# Patient Record
Sex: Male | Born: 1935 | Race: White | Hispanic: No | Marital: Married | Smoking: Former smoker
Health system: Southern US, Community
[De-identification: ages and names within clinical notes are randomized; demographics above are authoritative.]

## PROBLEM LIST (undated history)

## (undated) DIAGNOSIS — Z8679 Personal history of other diseases of the circulatory system: Secondary | ICD-10-CM

## (undated) DIAGNOSIS — K219 Gastro-esophageal reflux disease without esophagitis: Secondary | ICD-10-CM

## (undated) DIAGNOSIS — E782 Mixed hyperlipidemia: Secondary | ICD-10-CM

## (undated) DIAGNOSIS — E785 Hyperlipidemia, unspecified: Secondary | ICD-10-CM

## (undated) DIAGNOSIS — M790 Rheumatism, unspecified: Secondary | ICD-10-CM

## (undated) DIAGNOSIS — C4491 Basal cell carcinoma of skin, unspecified: Secondary | ICD-10-CM

## (undated) DIAGNOSIS — I10 Essential (primary) hypertension: Secondary | ICD-10-CM

## (undated) DIAGNOSIS — G4700 Insomnia, unspecified: Secondary | ICD-10-CM

## (undated) DIAGNOSIS — I499 Cardiac arrhythmia, unspecified: Secondary | ICD-10-CM

## (undated) DIAGNOSIS — M1990 Unspecified osteoarthritis, unspecified site: Secondary | ICD-10-CM

## (undated) HISTORY — DX: Gastro-esophageal reflux disease without esophagitis: K21.9

## (undated) HISTORY — DX: Unspecified osteoarthritis, unspecified site: M19.90

## (undated) HISTORY — PX: HERNIA REPAIR: SHX51

## (undated) HISTORY — DX: Essential (primary) hypertension: I10

## (undated) HISTORY — PX: CATARACT EXTRACTION: SUR2

## (undated) HISTORY — DX: Basal cell carcinoma of skin, unspecified: C44.91

## (undated) HISTORY — DX: Mixed hyperlipidemia: E78.2

## (undated) HISTORY — DX: Personal history of other diseases of the circulatory system: Z86.79

## (undated) HISTORY — DX: Hyperlipidemia, unspecified: E78.5

## (undated) HISTORY — DX: Rheumatism, unspecified: M79.0

## (undated) HISTORY — DX: Cardiac arrhythmia, unspecified: I49.9

## (undated) HISTORY — PX: POLYPECTOMY: SHX149

## (undated) HISTORY — PX: HIP ARTHROPLASTY: SHX981

## (undated) HISTORY — DX: Insomnia, unspecified: G47.00

## (undated) HISTORY — PX: OTHER SURGICAL HISTORY: SHX169

## (undated) MED FILL — Ferumoxytol Inj 510 MG/17ML (30 MG/ML) (Elemental Fe): INTRAVENOUS | Qty: 17 | Status: AC

---

## 2002-06-30 ENCOUNTER — Encounter: Admission: RE | Admit: 2002-06-30 | Discharge: 2002-06-30 | Payer: Self-pay | Admitting: Family Medicine

## 2002-06-30 ENCOUNTER — Encounter: Payer: Self-pay | Admitting: Family Medicine

## 2002-08-17 ENCOUNTER — Ambulatory Visit (HOSPITAL_COMMUNITY): Admission: RE | Admit: 2002-08-17 | Discharge: 2002-08-17 | Payer: Self-pay | Admitting: Gastroenterology

## 2008-03-31 ENCOUNTER — Encounter: Admission: RE | Admit: 2008-03-31 | Discharge: 2008-03-31 | Payer: Self-pay | Admitting: Family Medicine

## 2008-09-19 ENCOUNTER — Encounter: Admission: RE | Admit: 2008-09-19 | Discharge: 2008-09-19 | Payer: Self-pay | Admitting: Family Medicine

## 2008-09-19 IMAGING — CR DG KNEE 1-2V*R*
2 series · 2 of 2 positions shown · non-contrast
Comparison: None.

CLINICAL DATA: Right hip and knee pain for several months.  No
known injury.

RIGHT KNEE - 1-2 VIEW

[view not recorded (1 of 2)]
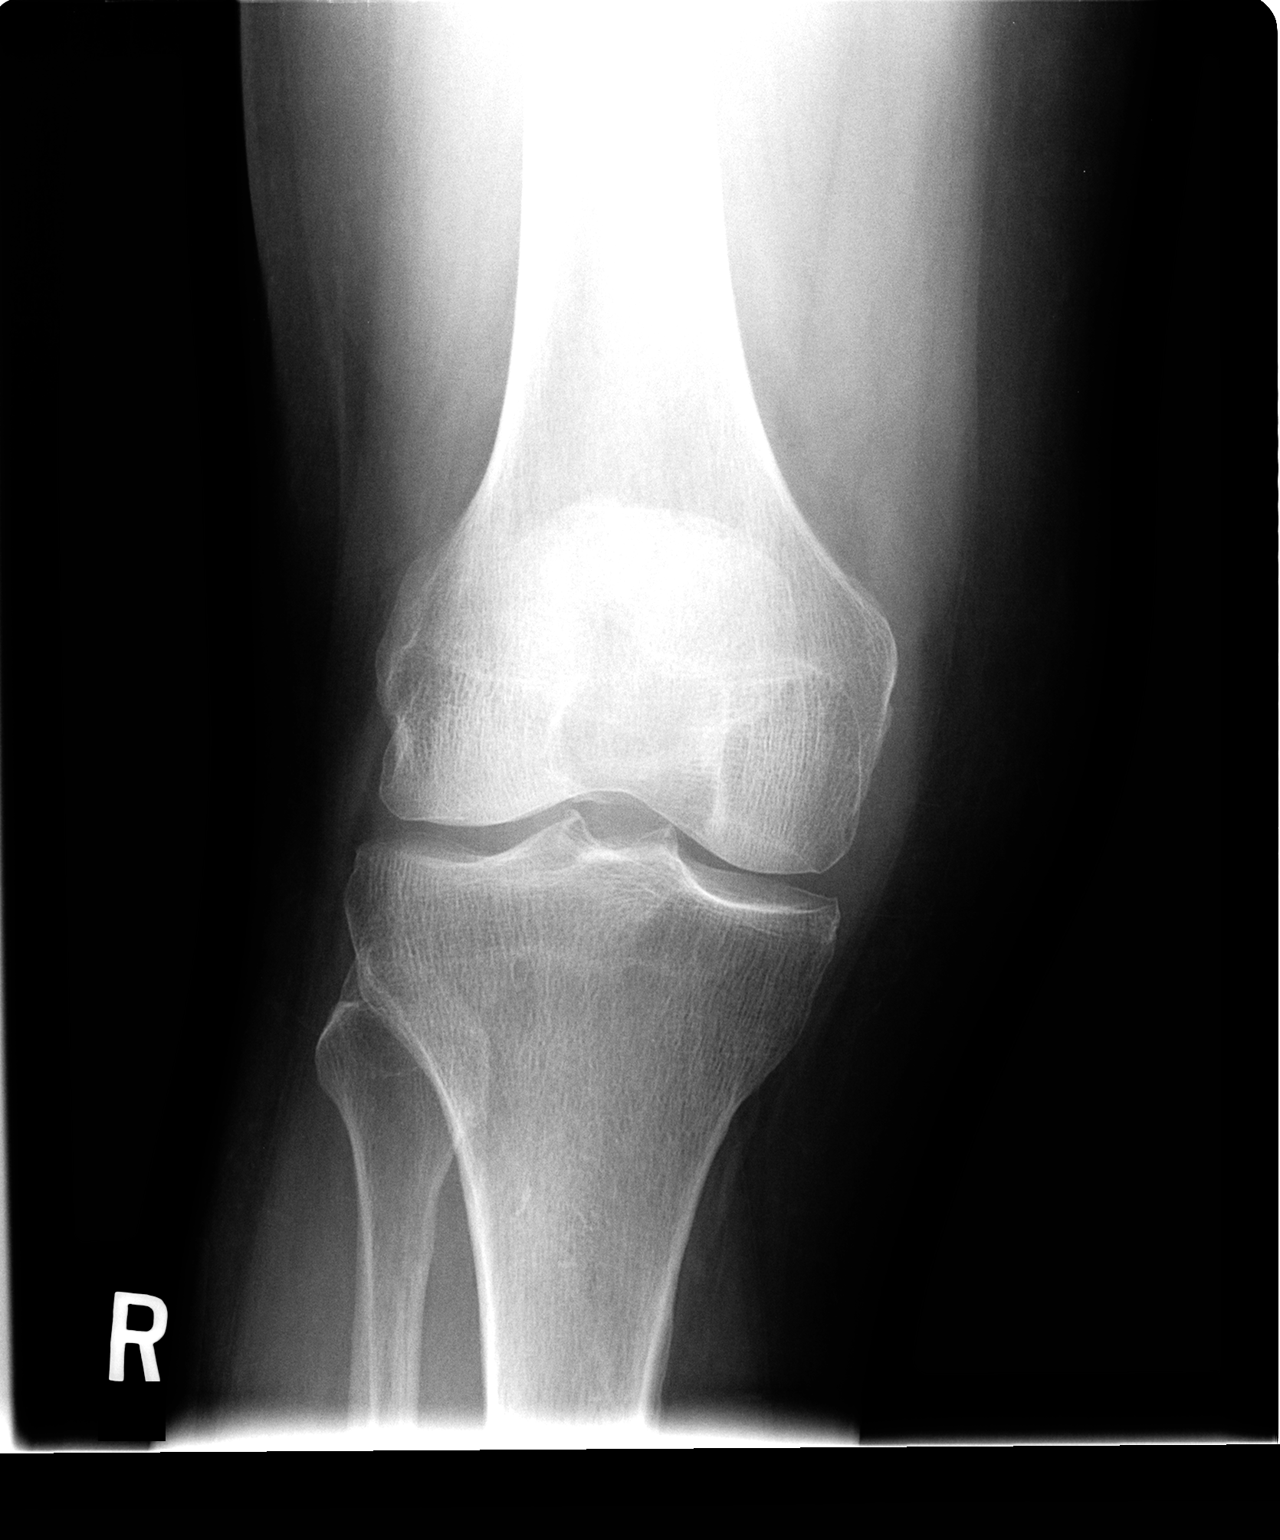

[view not recorded (2 of 2)]
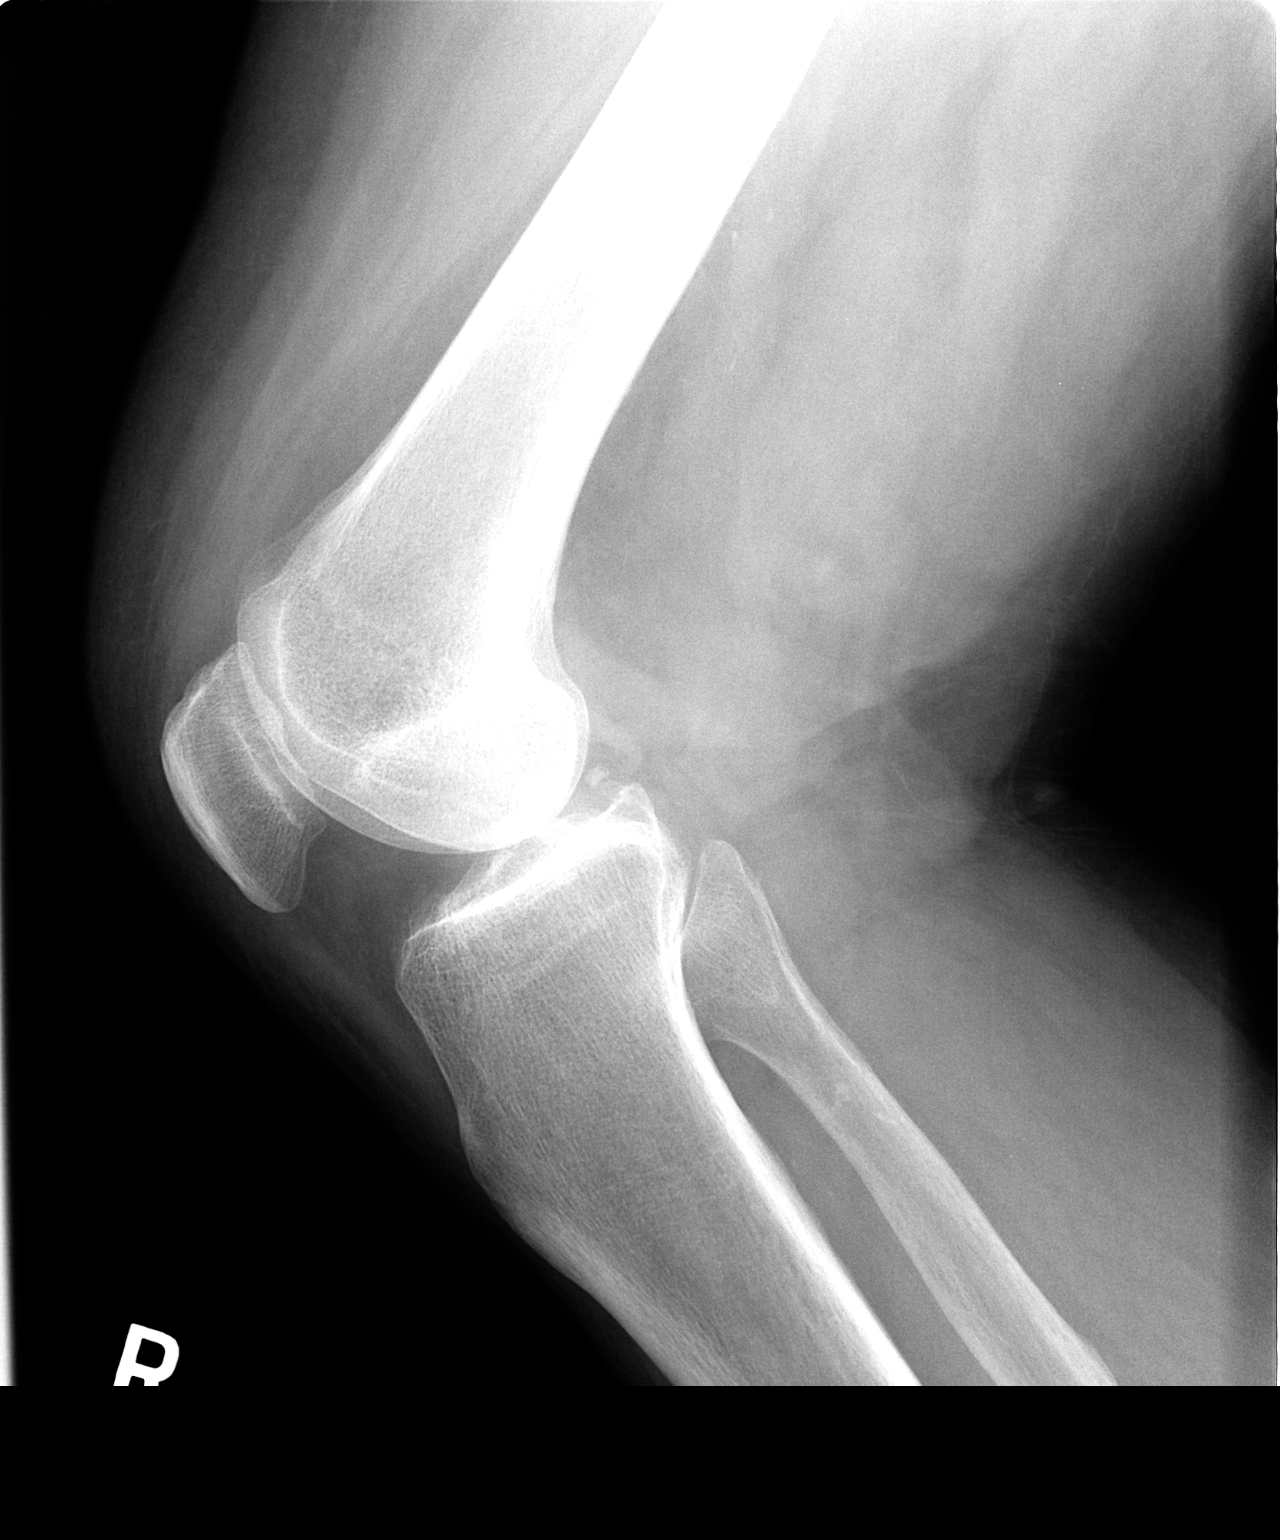

[2 of 2 positions shown; findings below may reference images not displayed]

FINDINGS: Minimal tibial spine and medial spur formation.  No
effusion seen.
IMPRESSION: Minimal degenerative changes.

## 2009-09-28 ENCOUNTER — Encounter: Admission: RE | Admit: 2009-09-28 | Discharge: 2009-09-28 | Payer: Self-pay | Admitting: Family Medicine

## 2010-08-09 NOTE — Op Note (Signed)
   NAME:  Mike Mclaughlin, Mike Mclaughlin                           ACCOUNT NO.:  [id number]   MEDICAL RECORD NO.:  [id number]                   PATIENT TYPE:  AMB   LOCATION:  ENDO                                 FACILITY:  [facility]   PHYSICIAN:  Anselmo Rod, M.D.               DATE OF BIRTH:  [birth year]   DATE OF PROCEDURE:  08/18/2002  DATE OF DISCHARGE:                                 OPERATIVE REPORT   PROCEDURE PERFORMED:  Esophagogastroduodenoscopy.   ENDOSCOPIST:  Charna Elizabeth, M.D.   INSTRUMENT USED:  Olympus video panendoscope.   INDICATIONS FOR PROCEDURE:  The patient is a 76 year old [place] male with a  history of dysphagia undergoing EGD for possible dilatation.  The patient's  symptoms have improved considerably after starting a PPI.  He had a barium  swallow done that showed mild scarring possibly stricture from chronic  reflux.   PREPROCEDURE PREPARATION:  Informed consent was procured from the patient.  The patient was fasted for eight hours prior to the procedure.   PREPROCEDURE PHYSICAL:  The patient had stable vital signs.  Neck supple,  chest clear to auscultation.  S1, S2 regular.  Abdomen soft with normal  bowel sounds.   DESCRIPTION OF PROCEDURE:  The patient was placed in the left lateral  decubitus position and sedated with 60 mg of Demerol and 6 mg of Versed  intravenously.  Once the patient was adequately sedated and maintained on  low-flow oxygen and continuous cardiac monitoring, the Olympus video  panendoscope was advanced through the mouth piece over the tongue into the  esophagus under direct vision.  The entire esophagus appeared normal and was  widely patent with no evidence of ring, stricture, esophagitis or Barrett's  mucosa.  The scope was then advanced to the stomach.  There was mild diffuse  gastritis throughout the gastric mucosa.  No erosions, ulcerations, masses  or polyps were seen.  The duodenal bulb and the proximal small bowel distal  to the bulb  appeared normal.   IMPRESSION:  1. Widely patent esophagus.  2. Mild diffuse gastritis, otherwise normal esophagogastroduodenoscopy.    RECOMMENDATIONS:  1. Continue Prevacid.  2. Proceed with a colonoscopy at this time.  Further recommendations will be     made after the colonoscopy.                                                Anselmo Rod, M.D.    JNM/MEDQ  D:  08/18/2002  T:  08/19/2002  Job:  [id number]   cc:   Talmadge Coventry, M.D.  [address]  [place]  [place] [address]  Fax: [phone]

## 2010-08-09 NOTE — Op Note (Signed)
   NAME:  Mike Mclaughlin, Mike Mclaughlin                           ACCOUNT NO.:  [id number]   MEDICAL RECORD NO.:  [id number]                   PATIENT TYPE:  AMB   LOCATION:  ENDO                                 FACILITY:  [facility]   PHYSICIAN:  Anselmo Rod, M.D.               DATE OF BIRTH:  [birth year]   DATE OF PROCEDURE:  08/17/2002  DATE OF DISCHARGE:                                 OPERATIVE REPORT   PROCEDURE:  Colonoscopy.   ENDOSCOPIST:  Anselmo Rod, M.D.   INSTRUMENT USED:  Olympus video colonoscope.   INDICATION FOR PROCEDURE:  Personal history of polyps in a 76 year old [place]  male.  Rule out recurrent polyps.   PREPROCEDURE PREPARATION:  Informed consent was procured from the patient.  The patient was fasted for eight hours prior to the procedure and prepped  with a bottle of magnesium citrate and a gallon of GoLYTELY the night prior  to the procedure.   PREPROCEDURE PHYSICAL:  VITAL SIGNS:  The patient had stable vital signs.  NECK:  Supple.  CHEST:  Clear to auscultation.  S1, S2 regular.  ABDOMEN:  Soft with normal bowel sounds.   DESCRIPTION OF PROCEDURE:  The patient was placed in the left lateral  decubitus position and sedated with 40 mg of Demerol and an additional 4 mg  of Versed intravenously.  Once the patient was adequately sedate and  maintained on low-flow oxygen and continuous cardiac monitoring, the Olympus  video colonoscope was advanced from the rectum to the cecum and terminal  ileum without difficulty.  No masses, polyps, erosions, ulcerations, or  diverticula were seen.  A small internal hemorrhoid was seen on retroflexion  in the rectum.  The patient tolerated the procedure well without  complication.   IMPRESSION:  Normal colonoscopy up to the terminal ileum except for small  internal hemorrhoids.    RECOMMENDATIONS:  1. A high-fiber diet with liberal fluid intake has been recommended.  2. Repeat CRC is recommended in the next five years unless the  patient     develops any abnormal symptoms in the interim.                                               Anselmo Rod, M.D.    JNM/MEDQ  D:  08/18/2002  T:  08/19/2002  Job:  [id number]   cc:   Talmadge Coventry, M.D.  [address]  [place]  [place] [address]  Fax: [phone]

## 2011-05-14 DIAGNOSIS — J069 Acute upper respiratory infection, unspecified: Secondary | ICD-10-CM | POA: Diagnosis not present

## 2011-07-14 DIAGNOSIS — Z23 Encounter for immunization: Secondary | ICD-10-CM | POA: Diagnosis not present

## 2011-07-14 DIAGNOSIS — I10 Essential (primary) hypertension: Secondary | ICD-10-CM | POA: Diagnosis not present

## 2011-07-14 DIAGNOSIS — F17200 Nicotine dependence, unspecified, uncomplicated: Secondary | ICD-10-CM | POA: Diagnosis not present

## 2011-07-14 DIAGNOSIS — J449 Chronic obstructive pulmonary disease, unspecified: Secondary | ICD-10-CM | POA: Diagnosis not present

## 2011-07-14 DIAGNOSIS — M542 Cervicalgia: Secondary | ICD-10-CM | POA: Diagnosis not present

## 2011-07-14 DIAGNOSIS — E7800 Pure hypercholesterolemia, unspecified: Secondary | ICD-10-CM | POA: Diagnosis not present

## 2011-08-06 DIAGNOSIS — J069 Acute upper respiratory infection, unspecified: Secondary | ICD-10-CM | POA: Diagnosis not present

## 2011-08-06 DIAGNOSIS — J449 Chronic obstructive pulmonary disease, unspecified: Secondary | ICD-10-CM | POA: Diagnosis not present

## 2011-09-05 ENCOUNTER — Encounter: Payer: Self-pay | Admitting: *Deleted

## 2011-09-23 DIAGNOSIS — N401 Enlarged prostate with lower urinary tract symptoms: Secondary | ICD-10-CM | POA: Diagnosis not present

## 2011-09-23 DIAGNOSIS — N138 Other obstructive and reflux uropathy: Secondary | ICD-10-CM | POA: Diagnosis not present

## 2011-09-23 DIAGNOSIS — R972 Elevated prostate specific antigen [PSA]: Secondary | ICD-10-CM | POA: Diagnosis not present

## 2011-12-31 ENCOUNTER — Encounter: Payer: Self-pay | Admitting: Cardiology

## 2012-02-26 DIAGNOSIS — Z23 Encounter for immunization: Secondary | ICD-10-CM | POA: Diagnosis not present

## 2012-03-05 DIAGNOSIS — J209 Acute bronchitis, unspecified: Secondary | ICD-10-CM | POA: Diagnosis not present

## 2012-05-26 ENCOUNTER — Encounter: Payer: Self-pay | Admitting: *Deleted

## 2012-06-10 DIAGNOSIS — G4700 Insomnia, unspecified: Secondary | ICD-10-CM | POA: Diagnosis not present

## 2012-06-10 DIAGNOSIS — E7800 Pure hypercholesterolemia, unspecified: Secondary | ICD-10-CM | POA: Diagnosis not present

## 2012-06-10 DIAGNOSIS — M1990 Unspecified osteoarthritis, unspecified site: Secondary | ICD-10-CM | POA: Diagnosis not present

## 2012-06-10 DIAGNOSIS — Z0000 Encounter for general adult medical examination without abnormal findings: Secondary | ICD-10-CM | POA: Diagnosis not present

## 2012-08-24 DIAGNOSIS — G4700 Insomnia, unspecified: Secondary | ICD-10-CM | POA: Diagnosis not present

## 2012-08-24 DIAGNOSIS — F17200 Nicotine dependence, unspecified, uncomplicated: Secondary | ICD-10-CM | POA: Diagnosis not present

## 2012-08-24 DIAGNOSIS — M1990 Unspecified osteoarthritis, unspecified site: Secondary | ICD-10-CM | POA: Diagnosis not present

## 2012-08-24 DIAGNOSIS — I499 Cardiac arrhythmia, unspecified: Secondary | ICD-10-CM | POA: Diagnosis not present

## 2012-09-05 DIAGNOSIS — I10 Essential (primary) hypertension: Secondary | ICD-10-CM | POA: Diagnosis not present

## 2012-09-23 DIAGNOSIS — R972 Elevated prostate specific antigen [PSA]: Secondary | ICD-10-CM | POA: Diagnosis not present

## 2012-12-20 DIAGNOSIS — Z23 Encounter for immunization: Secondary | ICD-10-CM | POA: Diagnosis not present

## 2013-10-11 DIAGNOSIS — Z79899 Other long term (current) drug therapy: Secondary | ICD-10-CM | POA: Diagnosis not present

## 2013-10-11 DIAGNOSIS — E782 Mixed hyperlipidemia: Secondary | ICD-10-CM | POA: Diagnosis not present

## 2013-10-17 DIAGNOSIS — Z23 Encounter for immunization: Secondary | ICD-10-CM | POA: Diagnosis not present

## 2013-10-17 DIAGNOSIS — J449 Chronic obstructive pulmonary disease, unspecified: Secondary | ICD-10-CM | POA: Diagnosis not present

## 2013-10-17 DIAGNOSIS — R5381 Other malaise: Secondary | ICD-10-CM | POA: Diagnosis not present

## 2013-10-17 DIAGNOSIS — R222 Localized swelling, mass and lump, trunk: Secondary | ICD-10-CM | POA: Diagnosis not present

## 2013-10-17 DIAGNOSIS — R5383 Other fatigue: Secondary | ICD-10-CM | POA: Diagnosis not present

## 2013-10-28 DIAGNOSIS — J988 Other specified respiratory disorders: Secondary | ICD-10-CM | POA: Diagnosis not present

## 2013-10-28 DIAGNOSIS — E278 Other specified disorders of adrenal gland: Secondary | ICD-10-CM | POA: Diagnosis not present

## 2013-10-28 DIAGNOSIS — R599 Enlarged lymph nodes, unspecified: Secondary | ICD-10-CM | POA: Diagnosis not present

## 2013-10-28 DIAGNOSIS — K449 Diaphragmatic hernia without obstruction or gangrene: Secondary | ICD-10-CM | POA: Diagnosis not present

## 2013-10-28 DIAGNOSIS — J438 Other emphysema: Secondary | ICD-10-CM | POA: Diagnosis not present

## 2013-10-28 DIAGNOSIS — R911 Solitary pulmonary nodule: Secondary | ICD-10-CM | POA: Diagnosis not present

## 2013-10-28 DIAGNOSIS — J984 Other disorders of lung: Secondary | ICD-10-CM | POA: Diagnosis not present

## 2013-10-28 DIAGNOSIS — J398 Other specified diseases of upper respiratory tract: Secondary | ICD-10-CM | POA: Diagnosis not present

## 2013-11-25 ENCOUNTER — Encounter: Payer: Self-pay | Admitting: Critical Care Medicine

## 2013-11-29 ENCOUNTER — Encounter: Payer: Self-pay | Admitting: Critical Care Medicine

## 2013-11-29 ENCOUNTER — Ambulatory Visit (INDEPENDENT_AMBULATORY_CARE_PROVIDER_SITE_OTHER): Payer: Self-pay | Admitting: Critical Care Medicine

## 2013-11-29 VITALS — BP 136/74 | HR 61 | Temp 98.2°F | Ht 70.0 in | Wt 216.0 lb

## 2013-11-29 DIAGNOSIS — R0989 Other specified symptoms and signs involving the circulatory and respiratory systems: Secondary | ICD-10-CM | POA: Diagnosis not present

## 2013-11-29 DIAGNOSIS — E785 Hyperlipidemia, unspecified: Secondary | ICD-10-CM | POA: Insufficient documentation

## 2013-11-29 DIAGNOSIS — J438 Other emphysema: Secondary | ICD-10-CM

## 2013-11-29 DIAGNOSIS — M1990 Unspecified osteoarthritis, unspecified site: Secondary | ICD-10-CM

## 2013-11-29 DIAGNOSIS — K219 Gastro-esophageal reflux disease without esophagitis: Secondary | ICD-10-CM | POA: Insufficient documentation

## 2013-11-29 DIAGNOSIS — J441 Chronic obstructive pulmonary disease with (acute) exacerbation: Secondary | ICD-10-CM | POA: Insufficient documentation

## 2013-11-29 DIAGNOSIS — R911 Solitary pulmonary nodule: Secondary | ICD-10-CM

## 2013-11-29 DIAGNOSIS — I10 Essential (primary) hypertension: Secondary | ICD-10-CM

## 2013-11-29 DIAGNOSIS — J449 Chronic obstructive pulmonary disease, unspecified: Secondary | ICD-10-CM | POA: Insufficient documentation

## 2013-11-29 DIAGNOSIS — J432 Centrilobular emphysema: Secondary | ICD-10-CM

## 2013-11-29 DIAGNOSIS — Z8679 Personal history of other diseases of the circulatory system: Secondary | ICD-10-CM | POA: Insufficient documentation

## 2013-11-29 DIAGNOSIS — R0609 Other forms of dyspnea: Secondary | ICD-10-CM | POA: Diagnosis not present

## 2013-11-29 HISTORY — DX: Solitary pulmonary nodule: R91.1

## 2013-11-29 HISTORY — DX: Chronic obstructive pulmonary disease, unspecified: J44.9

## 2013-11-29 MED ORDER — TIOTROPIUM BROMIDE MONOHYDRATE 18 MCG IN CAPS: 18.0000 ug | ORAL_CAPSULE | Freq: Every day | RESPIRATORY_TRACT | Status: DC

## 2013-11-29 MED ORDER — BUDESONIDE-FORMOTEROL FUMARATE 80-4.5 MCG/ACT IN AERO: 2.0000 | INHALATION_SPRAY | Freq: Two times a day (BID) | RESPIRATORY_TRACT | Status: DC

## 2013-11-29 MED ORDER — ALBUTEROL SULFATE HFA 108 (90 BASE) MCG/ACT IN AERS: 2.0000 | INHALATION_SPRAY | Freq: Four times a day (QID) | RESPIRATORY_TRACT | Status: DC | PRN

## 2013-11-29 NOTE — Assessment & Plan Note (Signed)
L lingular pulm nodule Needs f/u in one year 10/2014

## 2013-11-29 NOTE — Patient Instructions (Signed)
Resume spiriva Stay on symbicort A rescue inhaler proair will be given, use as needed for shortness of breath An overnight sleep oxygen test will be obtained Return 4 months Repeat CT Chest in 12 months, we will call and schedule

## 2013-11-29 NOTE — Assessment & Plan Note (Signed)
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  Ex smoker No desaturation with exertion Plan Refill and stay on spiriva Stay on symbicort two puff twice daily Check ONO on RA Not a candidate for pulm rehab due to joint issues F/u CT chest one year for pulm nodule

## 2013-11-29 NOTE — Progress Notes (Signed)
Subjective:    Patient ID: Mike Mclaughlin, male    DOB: [birth year], 79 y.o.   MRN: [id number]  HPI Comments: Copd referral from Dr Sheffield Slider.  Dx two years Copd.  Dyspnea , occ cough.    Shortness of Breath This is a chronic problem. The current episode started more than 1 year ago. The problem occurs daily (exertion only, only a few feet.  Not at rest). The problem has been gradually worsening. Associated symptoms include leg pain, leg swelling, sputum production and wheezing. Pertinent negatives include no abdominal pain, chest pain, claudication, coryza, ear pain, fever, headaches, hemoptysis, neck pain, orthopnea, PND, rash, rhinorrhea, sore throat, swollen glands, syncope or vomiting. The symptoms are aggravated by any activity and emotional upset. Risk factors include smoking. Treatments tried: spiriva, symbicort. The treatment provided moderate relief. His past medical history is significant for COPD. There is no history of allergies, aspirin allergies, asthma, bronchiolitis, CAD, chronic lung disease, DVT, a heart failure, PE, pneumonia or a recent surgery.    Past Medical History  Diagnosis Date  . Dyslipidemia   . H/O sinus bradycardia   . Hypertension   . Mixed hyperlipidemia   . Insomnia, unspecified   . Cardiac dysrhythmia, unspecified   . Arthritis   . Esophageal reflux   . Skin cancer, basal cell   . Rheumatism      Family History  Problem Relation Age of Onset  . Other Father     brain tumor  . Cancer Mother   . Heart disease Sister   . Stroke Father   . Rheumatologic disease Mother      History   Social History  . Marital Status: Married    Spouse Name: N/A    Number of Children: N/A  . Years of Education: N/A   Occupational History  . Not on file.   Social History Main Topics  . Smoking status: Former Smoker -- 0.50 packs/day for 15 years    Types: Cigarettes    Quit date: 08/29/2013  . Smokeless tobacco: Never Used  . Alcohol Use: No  . Drug  Use: No  . Sexual Activity: Not on file   Other Topics Concern  . Not on file   Social History Narrative   Married lives with wife.   Retired     No Known Allergies   Outpatient Prescriptions Prior to Visit  Medication Sig Dispense Refill  . aspirin 81 MG tablet Take 81 mg by mouth daily.      Marland Kitchen omeprazole (PRILOSEC) 20 MG capsule Take 20 mg by mouth daily.      . Tamsulosin HCl (FLOMAX) 0.4 MG CAPS Take by mouth.      . budesonide-formoterol (SYMBICORT) 80-4.5 MCG/ACT inhaler Inhale 2 puffs into the lungs 2 (two) times daily.      Marland Kitchen atorvastatin (LIPITOR) 40 MG tablet Take 40 mg by mouth daily.      . MELOXICAM PO Take by mouth.       No facility-administered medications prior to visit.      Review of Systems  Constitutional: Positive for fatigue. Negative for fever.  HENT: Positive for trouble swallowing. Negative for ear pain, postnasal drip, rhinorrhea, sinus pressure, sneezing, sore throat and voice change.   Respiratory: Positive for cough, sputum production, shortness of breath and wheezing. Negative for apnea, hemoptysis, choking, chest tightness and stridor.   Cardiovascular: Positive for leg swelling. Negative for chest pain, orthopnea, claudication, syncope and PND.  Gastrointestinal: Negative for vomiting  and abdominal pain.       Occ GERD  Musculoskeletal: Positive for arthralgias. Negative for neck pain.  Skin: Negative for rash.  Neurological: Negative for headaches.       Objective:   Physical Exam Filed Vitals:   11/29/13 0936  BP: 136/74  Pulse: 61  Temp: 98.2 F (36.8 C)  TempSrc: Oral  Height: 5\' 10"  (1.778 m)  Weight: 216 lb (97.977 kg)  SpO2: 92%    Gen: Pleasant, well-nourished, in no distress,  normal affect  ENT: No lesions,  mouth clear,  oropharynx clear, no postnasal drip  Neck: No JVD, no TMG, no carotid bruits  Lungs: No use of accessory muscles, no dullness to percussion, distant BS  Cardiovascular: RRR, heart sounds normal,  no murmur or gallops, no peripheral edema  Abdomen: soft and NT, no HSM,  BS normal  Musculoskeletal: No deformities, no cyanosis or clubbing  Neuro: alert, non focal  Skin: Warm, no lesions or rashes  No results found.   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  Ex smoker No desaturation with exertion Plan Refill and stay on spiriva Stay on symbicort two puff twice daily Check ONO on RA Not a candidate for pulm rehab due to joint issues F/u CT chest one year for pulm nodule   Solitary pulmonary nodule L lingular pulm nodule Needs f/u in one year 10/2014   Updated Medication List Outpatient Encounter Prescriptions as of 11/29/2013  Medication Sig  . aspirin 81 MG tablet Take 81 mg by mouth daily.  . budesonide-formoterol (SYMBICORT) 80-4.5 MCG/ACT inhaler Inhale 2 puffs into the lungs 2 (two) times daily.  Marland Kitchen omeprazole (PRILOSEC) 20 MG capsule Take 20 mg by mouth daily.  . Tamsulosin HCl (FLOMAX) 0.4 MG CAPS Take by mouth.  . [DISCONTINUED] budesonide-formoterol (SYMBICORT) 80-4.5 MCG/ACT inhaler Inhale 2 puffs into the lungs 2 (two) times daily.  Marland Kitchen albuterol (PROAIR HFA) 108 (90 BASE) MCG/ACT inhaler Inhale 2 puffs into the lungs every 6 (six) hours as needed for wheezing or shortness of breath.  . tiotropium (SPIRIVA HANDIHALER) 18 MCG inhalation capsule Place 1 capsule (18 mcg total) into inhaler and inhale daily.  . [DISCONTINUED] atorvastatin (LIPITOR) 40 MG tablet Take 40 mg by mouth daily.  . [DISCONTINUED] MELOXICAM PO Take by mouth.  . [DISCONTINUED] tiotropium (SPIRIVA HANDIHALER) 18 MCG inhalation capsule Place 18 mcg into inhaler and inhale daily.

## 2013-12-02 ENCOUNTER — Encounter: Payer: Self-pay | Admitting: Critical Care Medicine

## 2013-12-02 DIAGNOSIS — J449 Chronic obstructive pulmonary disease, unspecified: Secondary | ICD-10-CM | POA: Diagnosis not present

## 2013-12-02 DIAGNOSIS — J438 Other emphysema: Secondary | ICD-10-CM | POA: Diagnosis not present

## 2013-12-09 ENCOUNTER — Telehealth: Payer: Self-pay | Admitting: Critical Care Medicine

## 2013-12-09 DIAGNOSIS — J439 Emphysema, unspecified: Secondary | ICD-10-CM

## 2013-12-09 NOTE — Telephone Encounter (Signed)
Pt had ONO on RA on Sept 11, 2015.  Results received and reviewed by PW.  Per PW: pt needs o2 2 lpm qhs.  Called, spoke with pt.   Explained results and recs to him.  He verbalized understanding and is aware DME will be contacting him to set up o2.  He is to call office if he has any further questions/concerns or if not contacted by DME within the next few days.    Results placed in scan folder.

## 2014-01-06 DIAGNOSIS — Z23 Encounter for immunization: Secondary | ICD-10-CM | POA: Diagnosis not present

## 2014-01-16 DIAGNOSIS — J069 Acute upper respiratory infection, unspecified: Secondary | ICD-10-CM | POA: Diagnosis not present

## 2014-01-27 DIAGNOSIS — K219 Gastro-esophageal reflux disease without esophagitis: Secondary | ICD-10-CM | POA: Diagnosis not present

## 2014-01-27 DIAGNOSIS — J00 Acute nasopharyngitis [common cold]: Secondary | ICD-10-CM | POA: Diagnosis not present

## 2014-02-20 DIAGNOSIS — E785 Hyperlipidemia, unspecified: Secondary | ICD-10-CM | POA: Diagnosis not present

## 2014-02-20 DIAGNOSIS — J449 Chronic obstructive pulmonary disease, unspecified: Secondary | ICD-10-CM | POA: Diagnosis not present

## 2014-02-20 DIAGNOSIS — R001 Bradycardia, unspecified: Secondary | ICD-10-CM | POA: Diagnosis not present

## 2014-02-20 DIAGNOSIS — I2510 Atherosclerotic heart disease of native coronary artery without angina pectoris: Secondary | ICD-10-CM | POA: Diagnosis not present

## 2014-03-02 DIAGNOSIS — R001 Bradycardia, unspecified: Secondary | ICD-10-CM | POA: Diagnosis not present

## 2014-05-02 ENCOUNTER — Telehealth: Payer: Self-pay | Admitting: Critical Care Medicine

## 2014-05-02 NOTE — Telephone Encounter (Signed)
eror

## 2014-05-09 ENCOUNTER — Ambulatory Visit (INDEPENDENT_AMBULATORY_CARE_PROVIDER_SITE_OTHER): Payer: Self-pay | Admitting: Critical Care Medicine

## 2014-05-09 ENCOUNTER — Encounter: Payer: Self-pay | Admitting: Critical Care Medicine

## 2014-05-09 VITALS — BP 136/64 | HR 50 | Temp 96.6°F | Ht 70.0 in | Wt 215.0 lb

## 2014-05-09 DIAGNOSIS — J449 Chronic obstructive pulmonary disease, unspecified: Secondary | ICD-10-CM | POA: Diagnosis not present

## 2014-05-09 DIAGNOSIS — R911 Solitary pulmonary nodule: Secondary | ICD-10-CM | POA: Diagnosis not present

## 2014-05-09 DIAGNOSIS — J432 Centrilobular emphysema: Secondary | ICD-10-CM | POA: Diagnosis not present

## 2014-05-09 DIAGNOSIS — J4489 Other specified chronic obstructive pulmonary disease: Secondary | ICD-10-CM

## 2014-05-09 NOTE — Patient Instructions (Signed)
No change in medications. Return in          6 months A repeat CT of the Chest for the lung nodule will be obtained August 2016

## 2014-05-09 NOTE — Assessment & Plan Note (Signed)
Chronic obstructive lung disease with emphysematous component and chronic bronchitic component gold stage C stable this time Plan Maintain oxygen at night 2 L Maintain inhaled medications as prescribed Return 6 months

## 2014-05-09 NOTE — Assessment & Plan Note (Signed)
Solitary left lingular nodule 5 mm in diameter from August 2015 Plan Repeat nodule scan August 2016

## 2014-05-09 NOTE — Progress Notes (Signed)
Subjective:    Patient ID: Mike Mclaughlin, male    DOB: 03/10/1935, 80 y.o.   MRN: [id number]  HPI Comments: Copd referral from Dr Sheffield Slider.  Dx two years Copd.  Dyspnea , occ cough.    Shortness of Breath Associated symptoms include leg swelling and wheezing. Pertinent negatives include no abdominal pain, chest pain, ear pain, fever, headaches, neck pain, rash, rhinorrhea, sore throat, swollen glands or vomiting.   05/09/2014 Chief Complaint  Patient presents with  . Follow-up    DOE unchanged.  Not much coughing.  No chest tightness/CP or wheezing.  Plan Refill and stay on spiriva Stay on symbicort two puff twice daily Check ONO on RA  Dyspnea the same.  On the symbicort two puff bid.  Pt on oxygen 2L qhs     Review of Systems  Constitutional: Positive for fatigue. Negative for fever.  HENT: Positive for trouble swallowing. Negative for ear pain, postnasal drip, rhinorrhea, sinus pressure, sneezing, sore throat and voice change.   Respiratory: Positive for cough, shortness of breath and wheezing. Negative for apnea, choking, chest tightness and stridor.   Cardiovascular: Positive for leg swelling. Negative for chest pain.  Gastrointestinal: Negative for vomiting and abdominal pain.       Occ GERD  Musculoskeletal: Positive for arthralgias. Negative for neck pain.  Skin: Negative for rash.  Neurological: Negative for headaches.       Objective:   Physical Exam Filed Vitals:   05/09/14 1213  BP: 136/64  Pulse: 50  Temp: 96.6 F (35.9 C)  TempSrc: Oral  Height: 5\' 10"  (1.778 m)  Weight: 215 lb (97.523 kg)  SpO2: 93%    Gen: Pleasant, well-nourished, in no distress,  normal affect  ENT: No lesions,  mouth clear,  oropharynx clear, no postnasal drip  Neck: No JVD, no TMG, no carotid bruits  Lungs: No use of accessory muscles, no dullness to percussion, distant BS  Cardiovascular: RRR, heart sounds normal, no murmur or gallops, no peripheral edema  Abdomen:  soft and NT, no HSM,  BS normal  Musculoskeletal: No deformities, no cyanosis or clubbing  Neuro: alert, non focal  Skin: Warm, no lesions or rashes  No results found.         Assessment & Plan:   COPD with emphysema Chronic obstructive lung disease with emphysematous component and chronic bronchitic component gold stage C stable this time Plan Maintain oxygen at night 2 L Maintain inhaled medications as prescribed Return 6 months   Solitary pulmonary nodule Solitary left lingular nodule 5 mm in diameter from August 2015 Plan Repeat nodule scan August 2016     Updated Medication List Outpatient Encounter Prescriptions as of 05/09/2014  Medication Sig  . aspirin 81 MG tablet Take 81 mg by mouth daily.  . budesonide-formoterol (SYMBICORT) 80-4.5 MCG/ACT inhaler Inhale 2 puffs into the lungs 2 (two) times daily.  Marland Kitchen omeprazole (PRILOSEC) 20 MG capsule Take 20 mg by mouth daily.  . ranitidine (ZANTAC) 300 MG tablet Take 300 mg by mouth at bedtime as needed.  . Tamsulosin HCl (FLOMAX) 0.4 MG CAPS Take 0.4 mg by mouth daily.   . [DISCONTINUED] albuterol (PROAIR HFA) 108 (90 BASE) MCG/ACT inhaler Inhale 2 puffs into the lungs every 6 (six) hours as needed for wheezing or shortness of breath. (Patient not taking: Reported on 05/09/2014)  . [DISCONTINUED] tiotropium (SPIRIVA HANDIHALER) 18 MCG inhalation capsule Place 1 capsule (18 mcg total) into inhaler and inhale daily. (Patient not taking: Reported on  05/09/2014)      

## 2014-06-02 ENCOUNTER — Other Ambulatory Visit: Payer: Self-pay | Admitting: Acute Care

## 2014-08-15 DIAGNOSIS — R972 Elevated prostate specific antigen [PSA]: Secondary | ICD-10-CM | POA: Diagnosis not present

## 2014-08-15 DIAGNOSIS — N138 Other obstructive and reflux uropathy: Secondary | ICD-10-CM | POA: Diagnosis not present

## 2014-08-15 DIAGNOSIS — N401 Enlarged prostate with lower urinary tract symptoms: Secondary | ICD-10-CM | POA: Diagnosis not present

## 2014-08-28 DIAGNOSIS — H26492 Other secondary cataract, left eye: Secondary | ICD-10-CM | POA: Diagnosis not present

## 2014-10-25 ENCOUNTER — Telehealth: Payer: Self-pay | Admitting: Critical Care Medicine

## 2014-10-25 DIAGNOSIS — R911 Solitary pulmonary nodule: Secondary | ICD-10-CM

## 2014-10-25 NOTE — Telephone Encounter (Signed)
Pt needing CT Chest (WO contrast) in 10/2014 per last OV with PW on 2.16.16. LMTCB for pt to inform him it is time to schedule f/u CT. Will need to ask pt if he is wanting to have CT in [place], if so - will need to document within CT order.

## 2014-10-26 NOTE — Telephone Encounter (Signed)
Called and spoke with the pt and notified due for ct chest f/u spn  He verbalized understanding  Wants to get this done in [place], prefers pm appt  Order sent to [facility]

## 2014-11-07 DIAGNOSIS — J439 Emphysema, unspecified: Secondary | ICD-10-CM | POA: Diagnosis not present

## 2014-11-07 DIAGNOSIS — R911 Solitary pulmonary nodule: Secondary | ICD-10-CM | POA: Diagnosis not present

## 2014-11-07 DIAGNOSIS — Z85828 Personal history of other malignant neoplasm of skin: Secondary | ICD-10-CM | POA: Diagnosis not present

## 2014-11-10 ENCOUNTER — Telehealth: Payer: Self-pay | Admitting: Critical Care Medicine

## 2014-11-10 DIAGNOSIS — R911 Solitary pulmonary nodule: Secondary | ICD-10-CM

## 2014-11-10 NOTE — Telephone Encounter (Signed)
Let pt know CT chest stable. Nodule unchanged Needs another scan in ONE year

## 2014-11-14 NOTE — Telephone Encounter (Signed)
Discussed CT follow up with Dr. Joya Gaskins.  1 yr CT reminder in PW's reminder box to f/u on CT Chest.

## 2014-11-14 NOTE — Telephone Encounter (Signed)
Called, spoke with pt.  Discussed below CT Chest results and recs per Dr. Joya Gaskins.  Pt verbalized understanding. Will discuss repeat scan with PW.

## 2014-12-03 ENCOUNTER — Other Ambulatory Visit: Payer: Self-pay | Admitting: Critical Care Medicine

## 2014-12-05 ENCOUNTER — Ambulatory Visit (INDEPENDENT_AMBULATORY_CARE_PROVIDER_SITE_OTHER): Payer: Self-pay | Admitting: Critical Care Medicine

## 2014-12-05 ENCOUNTER — Encounter: Payer: Self-pay | Admitting: Critical Care Medicine

## 2014-12-05 VITALS — BP 130/62 | HR 57 | Temp 97.6°F | Ht 70.0 in | Wt 208.8 lb

## 2014-12-05 DIAGNOSIS — J449 Chronic obstructive pulmonary disease, unspecified: Secondary | ICD-10-CM

## 2014-12-05 DIAGNOSIS — J432 Centrilobular emphysema: Secondary | ICD-10-CM

## 2014-12-05 DIAGNOSIS — R911 Solitary pulmonary nodule: Secondary | ICD-10-CM | POA: Diagnosis not present

## 2014-12-05 DIAGNOSIS — G4734 Idiopathic sleep related nonobstructive alveolar hypoventilation: Secondary | ICD-10-CM

## 2014-12-05 DIAGNOSIS — J4489 Other specified chronic obstructive pulmonary disease: Secondary | ICD-10-CM

## 2014-12-05 MED ORDER — ALBUTEROL SULFATE 108 (90 BASE) MCG/ACT IN AEPB: 2.0000 | INHALATION_SPRAY | Freq: Four times a day (QID) | RESPIRATORY_TRACT | Status: DC | PRN

## 2014-12-05 MED ORDER — BUDESONIDE-FORMOTEROL FUMARATE 80-4.5 MCG/ACT IN AERO: 2.0000 | INHALATION_SPRAY | Freq: Two times a day (BID) | RESPIRATORY_TRACT | Status: DC

## 2014-12-05 NOTE — Patient Instructions (Signed)
Use symbicort two puff twice daily Obtain repeat CT Chest in one year Continue oxygen at night 2Liters Return 6 months

## 2014-12-05 NOTE — Progress Notes (Signed)
Subjective:    Patient ID: Mike Mclaughlin, male    DOB: 06-Sep-1934, 80 y.o.   MRN: [id number]  HPI 12/05/2014 Chief Complaint  Patient presents with  . Follow-up    Had CT Chest last mth. at [place].c/o sob with exertion,does well inside but outside with humidity,cough-[place],thick x 3-4 wks.,occass. wheezing,c/o chest congestion, denies cp or tightness,no fcs,no pnd.Needs Symbicort refilled today.    No real change . Pt on oxygen 2L qhs and prn daytime  This patient continues to use and benefit from her oxygen therapy and has re qualified at this visit for continued oxygen therapy  Pt denies any significant sore throat, nasal congestion or excess secretions, fever, chills, sweats, unintended weight loss, pleurtic or exertional chest pain, orthopnea PND, or leg swelling Pt denies any increase in rescue therapy over baseline, denies waking up needing it or having any early am or nocturnal exacerbations of coughing/wheezing/or dyspnea. Pt also denies any obvious fluctuation in symptoms with  weather or environmental change or other alleviating or aggravating factors   Current Medications, Allergies, Complete Past Medical History, Past Surgical History, Family History, and Social History were reviewed in [facility] record per todays encounter:  12/05/2014     Review of Systems  Constitutional: Negative.   HENT: Negative.  Negative for ear pain, postnasal drip, rhinorrhea, sinus pressure, sore throat, trouble swallowing and voice change.   Eyes: Negative.   Respiratory: Negative.  Negative for apnea, cough, choking, chest tightness, shortness of breath, wheezing and stridor.   Cardiovascular: Negative.  Negative for chest pain, palpitations and leg swelling.  Gastrointestinal: Negative.  Negative for nausea, vomiting, abdominal pain and abdominal distention.  Genitourinary: Negative.   Musculoskeletal: Negative.  Negative for myalgias and arthralgias.  Skin:  Negative.  Negative for rash.  Allergic/Immunologic: Negative.  Negative for environmental allergies and food allergies.  Neurological: Negative.  Negative for dizziness, syncope, weakness and headaches.  Hematological: Negative.  Negative for adenopathy. Does not bruise/bleed easily.  Psychiatric/Behavioral: Negative.  Negative for sleep disturbance and agitation. The patient is not nervous/anxious.        Objective:   Physical Exam Filed Vitals:   12/05/14 1101  BP: 130/62  Pulse: 57  Temp: 97.6 F (36.4 C)  TempSrc: Oral  Height: 5\' 10"  (1.778 m)  Weight: 208 lb 12.8 oz (94.711 kg)  SpO2: 95%    Gen: Pleasant, well-nourished, in no distress,  normal affect  ENT: No lesions,  mouth clear,  oropharynx clear, no postnasal drip  Neck: No JVD, no TMG, no carotid bruits  Lungs: No use of accessory muscles, no dullness to percussion, clear without rales or rhonchi  Cardiovascular: RRR, heart sounds normal, no murmur or gallops, no peripheral edema  Abdomen: soft and NT, no HSM,  BS normal  Musculoskeletal: No deformities, no cyanosis or clubbing  Neuro: alert, non focal  Skin: Warm, no lesions or rashes  No results found.  Ct chest 10/2014 62mm lingular nodule unchanged from before       Assessment & Plan:  I personally reviewed all images and lab data in the [facility] system as well as any outside material available during this office visit and agree with the  radiology impressions.   COPD with emphysema Gold C Stable copd gold C Plan Use symbicort two puff twice daily Obtain repeat CT Chest in one year Continue oxygen at night 2Liters Return 6 months    Solitary pulmonary nodule CT chest 10/2014 no change in 5MM L  lingula nodule Plan Repeat Ct chest 2017 if no change then is done with ct f/u    Mike Mclaughlin was seen today for follow-up.  Diagnoses and all orders for this visit:  COPD with chronic bronchitis  Nocturnal hypoxemia  Pulmonary  nodule  Centrilobular emphysema  Solitary pulmonary nodule  Other orders -     budesonide-formoterol (SYMBICORT) 80-4.5 MCG/ACT inhaler; Inhale 2 puffs into the lungs 2 (two) times daily. -     Albuterol Sulfate (PROAIR RESPICLICK) 017 (90 BASE) MCG/ACT AEPB; Inhale 2 puffs into the lungs every 6 (six) hours as needed.

## 2014-12-07 ENCOUNTER — Other Ambulatory Visit: Payer: Self-pay | Admitting: Critical Care Medicine

## 2014-12-07 DIAGNOSIS — R911 Solitary pulmonary nodule: Secondary | ICD-10-CM

## 2014-12-07 NOTE — Assessment & Plan Note (Signed)
Stable copd gold C Plan Use symbicort two puff twice daily Obtain repeat CT Chest in one year Continue oxygen at night 2Liters Return 6 months

## 2014-12-07 NOTE — Assessment & Plan Note (Signed)
CT chest 10/2014 no change in 5MM L lingula nodule Plan Repeat Ct chest 2017 if no change then is done with ct f/u

## 2015-01-22 ENCOUNTER — Encounter: Payer: Self-pay | Admitting: Critical Care Medicine

## 2015-01-29 DIAGNOSIS — I10 Essential (primary) hypertension: Secondary | ICD-10-CM | POA: Diagnosis not present

## 2015-01-29 DIAGNOSIS — K219 Gastro-esophageal reflux disease without esophagitis: Secondary | ICD-10-CM | POA: Diagnosis not present

## 2015-02-08 DIAGNOSIS — Z23 Encounter for immunization: Secondary | ICD-10-CM | POA: Diagnosis not present

## 2015-04-23 DIAGNOSIS — K5900 Constipation, unspecified: Secondary | ICD-10-CM | POA: Diagnosis not present

## 2015-04-23 DIAGNOSIS — J208 Acute bronchitis due to other specified organisms: Secondary | ICD-10-CM | POA: Diagnosis not present

## 2015-04-23 DIAGNOSIS — B9689 Other specified bacterial agents as the cause of diseases classified elsewhere: Secondary | ICD-10-CM | POA: Diagnosis not present

## 2015-05-01 DIAGNOSIS — Z0000 Encounter for general adult medical examination without abnormal findings: Secondary | ICD-10-CM | POA: Diagnosis not present

## 2015-05-01 DIAGNOSIS — K5900 Constipation, unspecified: Secondary | ICD-10-CM | POA: Diagnosis not present

## 2015-05-01 DIAGNOSIS — Z1389 Encounter for screening for other disorder: Secondary | ICD-10-CM | POA: Diagnosis not present

## 2015-05-01 DIAGNOSIS — Z9181 History of falling: Secondary | ICD-10-CM | POA: Diagnosis not present

## 2015-05-08 DIAGNOSIS — K5900 Constipation, unspecified: Secondary | ICD-10-CM | POA: Diagnosis not present

## 2015-09-19 DIAGNOSIS — H35371 Puckering of macula, right eye: Secondary | ICD-10-CM | POA: Diagnosis not present

## 2015-10-16 ENCOUNTER — Telehealth: Payer: Self-pay | Admitting: Internal Medicine

## 2015-10-16 DIAGNOSIS — R911 Solitary pulmonary nodule: Secondary | ICD-10-CM

## 2015-10-16 MED ORDER — BUDESONIDE-FORMOTEROL FUMARATE 80-4.5 MCG/ACT IN AERO: 2.0000 | INHALATION_SPRAY | Freq: Two times a day (BID) | RESPIRATORY_TRACT | 11 refills | Status: DC

## 2015-10-16 NOTE — Telephone Encounter (Signed)
Pt is aware of appt made on 11-14-15 at 3pm in [place] office with MW and CT Chest without contrast for Dx Solitary pulmonary nodule ordered under MW.   MW okay with refill for Symbicort under his name to [facility] [address] Tia Alert. Nothing more needed at this time.

## 2015-10-23 DIAGNOSIS — R911 Solitary pulmonary nodule: Secondary | ICD-10-CM | POA: Diagnosis not present

## 2015-10-29 ENCOUNTER — Telehealth: Payer: Self-pay | Admitting: Internal Medicine

## 2015-10-29 NOTE — Telephone Encounter (Signed)
Per MW- CT Chest 09/14/15 at [facility] was negative, no further studies are needed  Spoke with pt and notified of results per Dr. Melvyn Novas. Pt verbalized understanding and denied any questions.

## 2015-11-05 DIAGNOSIS — M160 Bilateral primary osteoarthritis of hip: Secondary | ICD-10-CM | POA: Diagnosis not present

## 2015-11-05 DIAGNOSIS — I10 Essential (primary) hypertension: Secondary | ICD-10-CM | POA: Diagnosis not present

## 2015-11-05 DIAGNOSIS — J449 Chronic obstructive pulmonary disease, unspecified: Secondary | ICD-10-CM | POA: Diagnosis not present

## 2015-11-05 DIAGNOSIS — Z720 Tobacco use: Secondary | ICD-10-CM | POA: Diagnosis not present

## 2015-11-07 ENCOUNTER — Encounter: Payer: Self-pay | Admitting: Internal Medicine

## 2015-11-14 ENCOUNTER — Ambulatory Visit (INDEPENDENT_AMBULATORY_CARE_PROVIDER_SITE_OTHER): Payer: Medicare Other | Admitting: Internal Medicine

## 2015-11-14 ENCOUNTER — Encounter: Payer: Self-pay | Admitting: Internal Medicine

## 2015-11-14 VITALS — BP 126/80 | HR 58 | Ht 70.0 in | Wt 206.0 lb

## 2015-11-14 DIAGNOSIS — J449 Chronic obstructive pulmonary disease, unspecified: Secondary | ICD-10-CM | POA: Diagnosis not present

## 2015-11-14 DIAGNOSIS — J9611 Chronic respiratory failure with hypoxia: Secondary | ICD-10-CM | POA: Insufficient documentation

## 2015-11-14 DIAGNOSIS — R911 Solitary pulmonary nodule: Secondary | ICD-10-CM | POA: Diagnosis not present

## 2015-11-14 HISTORY — DX: Chronic respiratory failure with hypoxia: J96.11

## 2015-11-14 MED ORDER — BUDESONIDE-FORMOTEROL FUMARATE 160-4.5 MCG/ACT IN AERO: 2.0000 | INHALATION_SPRAY | Freq: Two times a day (BID) | RESPIRATORY_TRACT | 0 refills | Status: DC

## 2015-11-14 NOTE — Progress Notes (Signed)
Subjective:    Patient ID: Mike Mclaughlin, male    DOB: 05-Sep-1934      MRN: [id number]    Brief patient profile:  36 yowm quit smoking 08/2013 with copd/ 02 at hs  Overall worse quit smoking and GOLD III criteria as of 11/14/2015    History of Present Illness  12/05/2014  Dr Drucie Opitz final note  Chief Complaint  Patient presents with  . Follow-up    Had CT Chest last mth. at [place].c/o sob with exertion,does well inside but outside with humidity,cough-[place],thick x 3-4 wks.,occass. wheezing,c/o chest congestion, denies cp or tightness,no fcs,no pnd.Needs Symbicort refilled today.  No real change . Pt on oxygen 2L qhs and prn daytime  This patient continues to use and benefit from her oxygen therapy and has re qualified at this visit for continued oxygen therapy rec Use symbicort two puff twice daily Obtain repeat CT Chest in one year> no change Continue oxygen at night 2Liters   11/14/2015  Extended transition of care  ov/Mike Mclaughlin re: GOLD III copd/ symb 80  Chief Complaint  Patient presents with  . Follow-up    former PW pt. breathing has worsen, pt c/o increased sob, prod cough w/[place] thick mucus & wheezing. on 2.5-3L qhs & prn  doe = MMRC3 = can't walk 100 yards even at a slow pace at a flat grade s stopping due to sob  Can't complete one aisle at [facility] Cough daily year round worse in am but doesn't wake him noc/ rattling all day long / some better on mucinex dm  Takes prilosec hs  No obvious day to day or daytime variability or assoc purulent sputum or mucus plugs or hemoptysis or cp or chest tightness, subjective wheeze or overt sinus or hb symptoms. No unusual exp hx or h/o childhood pna/ asthma or knowledge of premature birth.  Sleeping ok on 02 without nocturnal  or early am exacerbation  of respiratory  c/o's or need for noct saba. Also denies any obvious fluctuation of symptoms with weather or environmental changes or other aggravating or alleviating factors except as outlined  above   Current Medications, Allergies, Complete Past Medical History, Past Surgical History, Family History, and Social History were reviewed in [employer] record.  ROS  The following are not active complaints unless bolded sore throat, dysphagia, dental problems, itching, sneezing,  nasal congestion or excess/ purulent secretions, ear ache,   fever, chills, sweats, unintended wt loss, classically pleuritic or exertional cp,  orthopnea pnd or leg swelling, presyncope, palpitations, abdominal pain, anorexia, nausea, vomiting, diarrhea  or change in bowel or bladder habits, change in stools or urine, dysuria,hematuria,  rash, arthralgias, visual complaints, headache, numbness, weakness or ataxia or problems with walking or coordination,  change in mood/affect or memory.                     Objective:   Physical Exam    amb wm nad  Wt Readings from Last 3 Encounters:  11/14/15 206 lb (93.4 kg)  12/05/14 208 lb 12.8 oz (94.7 kg)  05/09/14 215 lb (97.5 kg)    Vital signs reviewed     Gen: Pleasant, well-nourished, in no distress,  normal affect  ENT: No lesions,  mouth clear,  oropharynx clear, no postnasal drip  Neck: No JVD, no TMG, no carotid bruits  Lungs: No use of accessory muscles, slt hyperresonant chest bilaterally with mid bilateral exp rhonchi/coughing  Cardiovascular: RRR, heart sounds normal, no  murmur or gallops, no peripheral edema  Abdomen: soft and NT, no HSM,  BS normal  Musculoskeletal: No deformities, no cyanosis or clubbing  Neuro: alert, non focal  Skin: Warm, no lesions or rashes  No results found.  Ct chest 10/2014 4mm lingular nodule unchanged from before       Assessment & Plan:

## 2015-11-14 NOTE — Patient Instructions (Addendum)
Plan A = Automatic = Symbicort 160 Take 2 puffs first thing in am and then another 2 puffs about 12 hours later.     Work on Engineer, technical sales technique:  relax and gently blow all the way out then take a nice smooth deep breath back in, triggering the inhaler at same time you start breathing in.  Hold for up to 5 seconds if you can. Blow out thru nose. Rinse and gargle with water when done     Plan B = Backup Only use your albuterol as a rescue medication to be used if you can't catch your breath by resting or doing a relaxed purse lip breathing pattern.  - The less you use it, the better it will work when you need it. - Ok to use the inhaler up to 2 puffs  every 4 hours if you must but call for appointment if use goes up over your usual need - Don't leave home without it !!  (think of it like the spare tire for your car)    Change prilosec to 20 mg Take 30-60 min before first meal of the day   GERD (REFLUX)  is an extremely common cause of respiratory symptoms just like yours , many times with no obvious heartburn at all.    It can be treated with medication, but also with lifestyle changes including elevation of the head of your bed (ideally with 6 inch  bed blocks),  Smoking cessation, avoidance of late meals, excessive alcohol, and avoid fatty foods, chocolate, peppermint, colas, red wine, and acidic juices such as orange juice.  NO MINT OR MENTHOL PRODUCTS SO NO COUGH DROPS   USE SUGARLESS CANDY INSTEAD (Jolley ranchers or Stover's or Life Savers) or even ice chips will also do - the key is to swallow to prevent all throat clearing. NO OIL BASED VITAMINS - use powdered substitutes.    Please schedule a follow up office visit in 6 weeks, call sooner if needed

## 2015-11-15 ENCOUNTER — Encounter: Payer: Self-pay | Admitting: Internal Medicine

## 2015-11-15 NOTE — Assessment & Plan Note (Addendum)
L lingula 86mm 10/2013.  Rescan 10/2014>>>no change in nodule,  Final CT 10/19/15 Tia Alert) > no change so no dedicated f/u rec

## 2015-11-15 NOTE — Assessment & Plan Note (Signed)
02 2 lpm hs only - added humidity 11/14/2015 >>>

## 2015-11-15 NOTE — Assessment & Plan Note (Signed)
09/2013  Mike Mclaughlin: FeV1 47% FVC 79% FeV1/FVC 59% No desat with exertion 11/2013 ONO RA>>>mult desat.  On 2L qhs oxygen as of 11/2013 see chronic resp failure   DDX of  difficult airways management almost all start with A and  include Adherence, Ace Inhibitors, Acid Reflux, Active Sinus Disease, Alpha 1 Antitripsin deficiency, Anxiety masquerading as Airways dz,  ABPA,  Allergy(esp in young), Aspiration (esp in elderly), Adverse effects of meds,  Active smokers, A bunch of PE's (a small clot burden can't cause this syndrome unless there is already severe underlying pulm or vascular dz with poor reserve) plus two Bs  = Bronchiectasis and Beta blocker use..and one C= CHF  Adherence is always the initial "prime suspect" and is a multilayered concern that requires a "trust but verify" approach in every patient - starting with knowing how to use medications, especially inhalers, correctly, keeping up with refills and understanding the fundamental difference between maintenance and prns vs those medications only taken for a very short course and then stopped and not refilled.  - - The proper method of use, as well as anticipated side effects, of a metered-dose inhaler are discussed and demonstrated to the patient. Improved effectiveness after extensive coaching during this visit to a level of approximately 90 % from a baseline of 75 % > try symbicort 160 2bid   The other option: Pt is Group B in terms of symptom/risk and laba/lama therefore appropriate rx at this point and may consider BEVESPi trial or add on LAMA at next ov    ? Acid (or non-acid) GERD > always difficult to exclude as up to 75% of pts in some series report no assoc GI/ Heartburn symptoms> rec max (24h)  acid suppression and diet restrictions/ reviewed and instructions given in writing.    I had an extended discussion with the patient reviewing all relevant studies completed to date and  lasting 25 minutes of a 40  minute transition of care/ new  to me to establish office visit    Each maintenance medication was reviewed in detail including most importantly the difference between maintenance and prns and under what circumstances the prns are to be triggered using an action plan format that is not reflected in the computer generated alphabetically organized AVS.    Please see instructions for details which were reviewed in writing and the patient given a copy highlighting the part that I personally wrote and discussed at today's ov.

## 2015-11-27 ENCOUNTER — Telehealth: Payer: Self-pay | Admitting: Internal Medicine

## 2015-11-27 MED ORDER — BUDESONIDE-FORMOTEROL FUMARATE 160-4.5 MCG/ACT IN AERO: 2.0000 | INHALATION_SPRAY | Freq: Two times a day (BID) | RESPIRATORY_TRACT | 3 refills | Status: DC

## 2015-11-27 NOTE — Telephone Encounter (Signed)
Rx sent to pharmacy. Patient aware. Nothing further needed.  

## 2015-12-13 DIAGNOSIS — M25552 Pain in left hip: Secondary | ICD-10-CM | POA: Diagnosis not present

## 2015-12-13 DIAGNOSIS — M25551 Pain in right hip: Secondary | ICD-10-CM | POA: Diagnosis not present

## 2015-12-13 DIAGNOSIS — M160 Bilateral primary osteoarthritis of hip: Secondary | ICD-10-CM | POA: Diagnosis not present

## 2015-12-13 DIAGNOSIS — M25651 Stiffness of right hip, not elsewhere classified: Secondary | ICD-10-CM | POA: Diagnosis not present

## 2015-12-13 DIAGNOSIS — M25652 Stiffness of left hip, not elsewhere classified: Secondary | ICD-10-CM | POA: Diagnosis not present

## 2015-12-17 DIAGNOSIS — M25552 Pain in left hip: Secondary | ICD-10-CM | POA: Diagnosis not present

## 2015-12-17 DIAGNOSIS — M25551 Pain in right hip: Secondary | ICD-10-CM | POA: Diagnosis not present

## 2015-12-17 DIAGNOSIS — M25652 Stiffness of left hip, not elsewhere classified: Secondary | ICD-10-CM | POA: Diagnosis not present

## 2015-12-17 DIAGNOSIS — M25651 Stiffness of right hip, not elsewhere classified: Secondary | ICD-10-CM | POA: Diagnosis not present

## 2015-12-17 DIAGNOSIS — M160 Bilateral primary osteoarthritis of hip: Secondary | ICD-10-CM | POA: Diagnosis not present

## 2015-12-20 DIAGNOSIS — M160 Bilateral primary osteoarthritis of hip: Secondary | ICD-10-CM | POA: Diagnosis not present

## 2015-12-20 DIAGNOSIS — M25552 Pain in left hip: Secondary | ICD-10-CM | POA: Diagnosis not present

## 2015-12-20 DIAGNOSIS — M25651 Stiffness of right hip, not elsewhere classified: Secondary | ICD-10-CM | POA: Diagnosis not present

## 2015-12-20 DIAGNOSIS — M25551 Pain in right hip: Secondary | ICD-10-CM | POA: Diagnosis not present

## 2015-12-20 DIAGNOSIS — M25652 Stiffness of left hip, not elsewhere classified: Secondary | ICD-10-CM | POA: Diagnosis not present

## 2015-12-25 DIAGNOSIS — M25652 Stiffness of left hip, not elsewhere classified: Secondary | ICD-10-CM | POA: Diagnosis not present

## 2015-12-25 DIAGNOSIS — M160 Bilateral primary osteoarthritis of hip: Secondary | ICD-10-CM | POA: Diagnosis not present

## 2015-12-25 DIAGNOSIS — M25552 Pain in left hip: Secondary | ICD-10-CM | POA: Diagnosis not present

## 2015-12-25 DIAGNOSIS — M25551 Pain in right hip: Secondary | ICD-10-CM | POA: Diagnosis not present

## 2015-12-25 DIAGNOSIS — M25651 Stiffness of right hip, not elsewhere classified: Secondary | ICD-10-CM | POA: Diagnosis not present

## 2015-12-26 ENCOUNTER — Ambulatory Visit: Payer: Medicare Other | Admitting: Internal Medicine

## 2015-12-27 DIAGNOSIS — M160 Bilateral primary osteoarthritis of hip: Secondary | ICD-10-CM | POA: Diagnosis not present

## 2015-12-27 DIAGNOSIS — M25651 Stiffness of right hip, not elsewhere classified: Secondary | ICD-10-CM | POA: Diagnosis not present

## 2015-12-27 DIAGNOSIS — M25652 Stiffness of left hip, not elsewhere classified: Secondary | ICD-10-CM | POA: Diagnosis not present

## 2015-12-27 DIAGNOSIS — M25552 Pain in left hip: Secondary | ICD-10-CM | POA: Diagnosis not present

## 2015-12-27 DIAGNOSIS — M25551 Pain in right hip: Secondary | ICD-10-CM | POA: Diagnosis not present

## 2015-12-31 DIAGNOSIS — M25652 Stiffness of left hip, not elsewhere classified: Secondary | ICD-10-CM | POA: Diagnosis not present

## 2015-12-31 DIAGNOSIS — M25551 Pain in right hip: Secondary | ICD-10-CM | POA: Diagnosis not present

## 2015-12-31 DIAGNOSIS — M25552 Pain in left hip: Secondary | ICD-10-CM | POA: Diagnosis not present

## 2015-12-31 DIAGNOSIS — M25651 Stiffness of right hip, not elsewhere classified: Secondary | ICD-10-CM | POA: Diagnosis not present

## 2015-12-31 DIAGNOSIS — M160 Bilateral primary osteoarthritis of hip: Secondary | ICD-10-CM | POA: Diagnosis not present

## 2016-01-07 DIAGNOSIS — M25652 Stiffness of left hip, not elsewhere classified: Secondary | ICD-10-CM | POA: Diagnosis not present

## 2016-01-07 DIAGNOSIS — M160 Bilateral primary osteoarthritis of hip: Secondary | ICD-10-CM | POA: Diagnosis not present

## 2016-01-07 DIAGNOSIS — M25552 Pain in left hip: Secondary | ICD-10-CM | POA: Diagnosis not present

## 2016-01-07 DIAGNOSIS — M25551 Pain in right hip: Secondary | ICD-10-CM | POA: Diagnosis not present

## 2016-01-07 DIAGNOSIS — M25651 Stiffness of right hip, not elsewhere classified: Secondary | ICD-10-CM | POA: Diagnosis not present

## 2016-01-09 ENCOUNTER — Encounter: Payer: Self-pay | Admitting: Internal Medicine

## 2016-01-09 ENCOUNTER — Ambulatory Visit (INDEPENDENT_AMBULATORY_CARE_PROVIDER_SITE_OTHER): Payer: Medicare Other | Admitting: Internal Medicine

## 2016-01-09 VITALS — BP 114/60 | HR 58 | Ht 70.0 in | Wt 204.0 lb

## 2016-01-09 DIAGNOSIS — Z23 Encounter for immunization: Secondary | ICD-10-CM | POA: Diagnosis not present

## 2016-01-09 DIAGNOSIS — J9611 Chronic respiratory failure with hypoxia: Secondary | ICD-10-CM

## 2016-01-09 DIAGNOSIS — J449 Chronic obstructive pulmonary disease, unspecified: Secondary | ICD-10-CM

## 2016-01-09 MED ORDER — BUDESONIDE-FORMOTEROL FUMARATE 160-4.5 MCG/ACT IN AERO: 2.0000 | INHALATION_SPRAY | Freq: Two times a day (BID) | RESPIRATORY_TRACT | 0 refills | Status: DC

## 2016-01-09 MED ORDER — ALBUTEROL SULFATE HFA 108 (90 BASE) MCG/ACT IN AERS: INHALATION_SPRAY | RESPIRATORY_TRACT | 11 refills | Status: DC

## 2016-01-09 NOTE — Progress Notes (Signed)
Subjective:    Patient ID: Mike Mclaughlin, male    DOB: [birth year]      MRN: [id number]    Brief patient profile:  52 yowm quit smoking 08/2013 with copd/ 02 at hs  Overall worse quit smoking and GOLD III criteria as of 11/14/2015    History of Present Illness  12/05/2014  Dr Mike Mclaughlin final note  Chief Complaint  Patient presents with  . Follow-up    Had CT Chest last mth. at [place].c/o sob with exertion,does well inside but outside with humidity,cough-[place],thick x 3-4 wks.,occass. wheezing,c/o chest congestion, denies cp or tightness,no fcs,no pnd.Needs Symbicort refilled today.  No real change . Pt on oxygen 2L qhs and prn daytime  This patient continues to use and benefit from her oxygen therapy and has re qualified at this visit for continued oxygen therapy rec Use symbicort two puff twice daily Obtain repeat CT Chest in one year> no change Continue oxygen at night 2Liters   11/14/2015  Extended transition of care  ov/Mike Mclaughlin re: GOLD III copd/ symb 80  Chief Complaint  Patient presents with  . Follow-up    former PW pt. breathing has worsen, pt c/o increased sob, prod cough w/[place] thick mucus & wheezing. on 2.5-3L qhs & prn  doe = MMRC3 = can't walk 100 yards even at a slow pace at a flat grade s stopping due to sob  Can't complete one aisle at [facility] Cough daily year round worse in am but doesn't wake him noc/ rattling all day long / some better on mucinex dm  Takes prilosec hs rec Plan A = Automatic = Symbicort 160 Take 2 puffs first thing in am and then another 2 puffs about 12 hours later.  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 Follow-up    Breathing is about the same. He is coughing less. He states he  rarely uses albuterol inhaler.   doe still MMRC3   No obvious day to day or daytime variability or assoc purulent sputum or mucus plugs or hemoptysis or cp or chest tightness, subjective wheeze or overt sinus or hb symptoms. No unusual exp hx or h/o childhood pna/ asthma or knowledge of premature birth.  Sleeping ok on 02 without nocturnal  or early am exacerbation  of respiratory  c/o's or need for noct saba. Also denies any obvious fluctuation of symptoms with weather or environmental changes or other aggravating or alleviating factors except as outlined above   Current Medications, Allergies, Complete Past Medical History, Past Surgical History, Family History, and Social History were reviewed in [employer] record.  ROS  The following are not active complaints unless bolded sore throat, dysphagia, dental problems, itching, sneezing,  nasal congestion or excess/ purulent secretions, ear ache,   fever, chills, sweats, unintended wt loss, classically pleuritic or exertional cp,  orthopnea pnd or leg swelling, presyncope, palpitations, abdominal pain, anorexia, nausea, vomiting, diarrhea  or change in bowel or bladder habits, change in stools or urine, dysuria,hematuria,  rash, arthralgias, visual complaints, headache, numbness, weakness or ataxia or problems with walking or coordination,  change in mood/affect or memory.  Objective:   Physical Exam    amb wm nad  01/09/2016      204  11/14/15 206 lb (93.4 kg)  12/05/14 208 lb 12.8 oz (94.7 kg)  05/09/14 215 lb (97.5 kg)    Vital signs reviewed - Note on arrival 02 sats  99% on RA      Gen: Pleasant, well-nourished, in no distress,  normal affect  ENT: No lesions,  mouth clear,  oropharynx clear, no postnasal drip  Neck: No JVD, no TMG, no carotid bruits  Lungs: No use of accessory muscles,  mildly hyperinflated with distant bs bilaterally s wheeze   Cardiovascular: RRR, heart  sounds normal, no murmur or gallops, no peripheral edema  Abdomen: soft and NT, no HSM,  BS normal  Musculoskeletal: No deformities, no cyanosis or clubbing  Neuro: alert, non focal  Skin: Warm, no lesions or rashes         I personally reviewed images and agree with radiology impression as follows:  CT 10/23/15  Chest      No change 5 mm nodule/ emphysema      Assessment & Plan:

## 2016-01-09 NOTE — Patient Instructions (Addendum)
Plan A = Automatic = Symbicort 160 Take 2 puffs first thing in am and then another 2 puffs about 12 hours later.    Work on Engineer, technical sales technique:  relax and gently blow all the way out then take a nice smooth deep breath back in, triggering the inhaler at same time you start breathing in.  Hold for up to 5 seconds if you can. Blow out thru nose. Rinse and gargle with water when done     Plan B = Backup Only use your albuterol (PROAIR HFA) as a rescue medication to be used if you can't catch your breath by resting or doing a relaxed purse lip breathing pattern.  - The less you use it, the better it will work when you need it. - Ok to use the inhaler up to 2 puffs  every 4 hours if you must but call for appointment if use goes up over your usual need - Don't leave home without it !!  (think of it like the spare tire for your car)    Continue  prilosec to 20 mg Take 30-60 min before first meal of the day   GERD (REFLUX)  is an extremely common cause of respiratory symptoms just like yours , many times with no obvious heartburn at all.    It can be treated with medication, but also with lifestyle changes including elevation of the head of your bed (ideally with 6 inch  bed blocks),  Smoking cessation, avoidance of late meals, excessive alcohol, and avoid fatty foods, chocolate, peppermint, colas, red wine, and acidic juices such as orange juice.  NO MINT OR MENTHOL PRODUCTS SO NO COUGH DROPS   USE SUGARLESS CANDY INSTEAD (Jolley ranchers or Stover's or Life Savers) or even ice chips will also do - the key is to swallow to prevent all throat clearing. NO OIL BASED VITAMINS - use powdered substitutes.    Please schedule a follow up visit in 3 months but call sooner if needed

## 2016-01-10 DIAGNOSIS — M160 Bilateral primary osteoarthritis of hip: Secondary | ICD-10-CM | POA: Diagnosis not present

## 2016-01-10 DIAGNOSIS — M25552 Pain in left hip: Secondary | ICD-10-CM | POA: Diagnosis not present

## 2016-01-10 DIAGNOSIS — M25652 Stiffness of left hip, not elsewhere classified: Secondary | ICD-10-CM | POA: Diagnosis not present

## 2016-01-10 DIAGNOSIS — M25651 Stiffness of right hip, not elsewhere classified: Secondary | ICD-10-CM | POA: Diagnosis not present

## 2016-01-10 DIAGNOSIS — M25551 Pain in right hip: Secondary | ICD-10-CM | POA: Diagnosis not present

## 2016-01-12 NOTE — Assessment & Plan Note (Signed)
02 2.5  lpm hs only - added humidity 11/14/2015 >>>

## 2016-01-12 NOTE — Assessment & Plan Note (Signed)
09/2013  Arlyce Harman: FeV1 47% FVC 79% FeV1/FVC 59% No desat with exertion 11/2013 ONO RA>>>mult desat.  On 2L qhs oxygen as of 11/2013 see chronic resp failure   - 01/09/2016  After extensive coaching HFA effectiveness =   75% > continue symbicort 160 2bid with hfa proair instead of respiclick   No reason to use dpi as rescue and hfa as maint but would rather him focus on one technique and master it  I had an extended discussion with the patient reviewing all relevant studies completed to date and  lasting 15 to 20 minutes of a 25 minute visit    Each maintenance medication was reviewed in detail including most importantly the difference between maintenance and prns and under what circumstances the prns are to be triggered using an action plan format that is not reflected in the computer generated alphabetically organized AVS.    Please see instructions for details which were reviewed in writing and the patient given a copy highlighting the part that I personally wrote and discussed at today's ov.

## 2016-01-14 DIAGNOSIS — M25652 Stiffness of left hip, not elsewhere classified: Secondary | ICD-10-CM | POA: Diagnosis not present

## 2016-01-14 DIAGNOSIS — M25651 Stiffness of right hip, not elsewhere classified: Secondary | ICD-10-CM | POA: Diagnosis not present

## 2016-01-14 DIAGNOSIS — M25551 Pain in right hip: Secondary | ICD-10-CM | POA: Diagnosis not present

## 2016-01-14 DIAGNOSIS — M25552 Pain in left hip: Secondary | ICD-10-CM | POA: Diagnosis not present

## 2016-01-14 DIAGNOSIS — M160 Bilateral primary osteoarthritis of hip: Secondary | ICD-10-CM | POA: Diagnosis not present

## 2016-01-21 DIAGNOSIS — M25651 Stiffness of right hip, not elsewhere classified: Secondary | ICD-10-CM | POA: Diagnosis not present

## 2016-01-21 DIAGNOSIS — M25552 Pain in left hip: Secondary | ICD-10-CM | POA: Diagnosis not present

## 2016-01-21 DIAGNOSIS — M160 Bilateral primary osteoarthritis of hip: Secondary | ICD-10-CM | POA: Diagnosis not present

## 2016-01-21 DIAGNOSIS — M25652 Stiffness of left hip, not elsewhere classified: Secondary | ICD-10-CM | POA: Diagnosis not present

## 2016-01-21 DIAGNOSIS — M25551 Pain in right hip: Secondary | ICD-10-CM | POA: Diagnosis not present

## 2016-01-29 DIAGNOSIS — M25552 Pain in left hip: Secondary | ICD-10-CM | POA: Diagnosis not present

## 2016-01-29 DIAGNOSIS — M25652 Stiffness of left hip, not elsewhere classified: Secondary | ICD-10-CM | POA: Diagnosis not present

## 2016-01-29 DIAGNOSIS — M25551 Pain in right hip: Secondary | ICD-10-CM | POA: Diagnosis not present

## 2016-01-29 DIAGNOSIS — M25651 Stiffness of right hip, not elsewhere classified: Secondary | ICD-10-CM | POA: Diagnosis not present

## 2016-01-29 DIAGNOSIS — M160 Bilateral primary osteoarthritis of hip: Secondary | ICD-10-CM | POA: Diagnosis not present

## 2016-02-01 DIAGNOSIS — M25551 Pain in right hip: Secondary | ICD-10-CM | POA: Diagnosis not present

## 2016-02-01 DIAGNOSIS — M25652 Stiffness of left hip, not elsewhere classified: Secondary | ICD-10-CM | POA: Diagnosis not present

## 2016-02-01 DIAGNOSIS — M160 Bilateral primary osteoarthritis of hip: Secondary | ICD-10-CM | POA: Diagnosis not present

## 2016-02-01 DIAGNOSIS — M25552 Pain in left hip: Secondary | ICD-10-CM | POA: Diagnosis not present

## 2016-02-01 DIAGNOSIS — M25651 Stiffness of right hip, not elsewhere classified: Secondary | ICD-10-CM | POA: Diagnosis not present

## 2016-02-05 DIAGNOSIS — M160 Bilateral primary osteoarthritis of hip: Secondary | ICD-10-CM | POA: Diagnosis not present

## 2016-02-05 DIAGNOSIS — M25552 Pain in left hip: Secondary | ICD-10-CM | POA: Diagnosis not present

## 2016-02-05 DIAGNOSIS — M25551 Pain in right hip: Secondary | ICD-10-CM | POA: Diagnosis not present

## 2016-02-05 DIAGNOSIS — M25652 Stiffness of left hip, not elsewhere classified: Secondary | ICD-10-CM | POA: Diagnosis not present

## 2016-02-05 DIAGNOSIS — M25651 Stiffness of right hip, not elsewhere classified: Secondary | ICD-10-CM | POA: Diagnosis not present

## 2016-02-11 DIAGNOSIS — M25552 Pain in left hip: Secondary | ICD-10-CM | POA: Diagnosis not present

## 2016-02-11 DIAGNOSIS — M25551 Pain in right hip: Secondary | ICD-10-CM | POA: Diagnosis not present

## 2016-02-11 DIAGNOSIS — M25652 Stiffness of left hip, not elsewhere classified: Secondary | ICD-10-CM | POA: Diagnosis not present

## 2016-02-11 DIAGNOSIS — M160 Bilateral primary osteoarthritis of hip: Secondary | ICD-10-CM | POA: Diagnosis not present

## 2016-02-11 DIAGNOSIS — M25651 Stiffness of right hip, not elsewhere classified: Secondary | ICD-10-CM | POA: Diagnosis not present

## 2016-02-18 DIAGNOSIS — M160 Bilateral primary osteoarthritis of hip: Secondary | ICD-10-CM | POA: Diagnosis not present

## 2016-02-18 DIAGNOSIS — M25651 Stiffness of right hip, not elsewhere classified: Secondary | ICD-10-CM | POA: Diagnosis not present

## 2016-02-18 DIAGNOSIS — M25652 Stiffness of left hip, not elsewhere classified: Secondary | ICD-10-CM | POA: Diagnosis not present

## 2016-02-18 DIAGNOSIS — M25552 Pain in left hip: Secondary | ICD-10-CM | POA: Diagnosis not present

## 2016-02-18 DIAGNOSIS — M25551 Pain in right hip: Secondary | ICD-10-CM | POA: Diagnosis not present

## 2016-02-21 DIAGNOSIS — J441 Chronic obstructive pulmonary disease with (acute) exacerbation: Secondary | ICD-10-CM | POA: Diagnosis not present

## 2016-02-21 DIAGNOSIS — I10 Essential (primary) hypertension: Secondary | ICD-10-CM | POA: Diagnosis not present

## 2016-02-21 DIAGNOSIS — M1610 Unilateral primary osteoarthritis, unspecified hip: Secondary | ICD-10-CM | POA: Diagnosis not present

## 2016-02-21 DIAGNOSIS — Z720 Tobacco use: Secondary | ICD-10-CM | POA: Diagnosis not present

## 2016-04-11 ENCOUNTER — Ambulatory Visit: Payer: Medicare Other | Admitting: Internal Medicine

## 2016-04-28 ENCOUNTER — Encounter: Payer: Self-pay | Admitting: Internal Medicine

## 2016-04-28 ENCOUNTER — Ambulatory Visit (INDEPENDENT_AMBULATORY_CARE_PROVIDER_SITE_OTHER): Payer: Medicare Other | Admitting: Internal Medicine

## 2016-04-28 VITALS — BP 130/64 | HR 58 | Ht 70.0 in | Wt 208.0 lb

## 2016-04-28 DIAGNOSIS — J9611 Chronic respiratory failure with hypoxia: Secondary | ICD-10-CM | POA: Diagnosis not present

## 2016-04-28 DIAGNOSIS — J449 Chronic obstructive pulmonary disease, unspecified: Secondary | ICD-10-CM

## 2016-04-28 MED ORDER — GLYCOPYRROLATE-FORMOTEROL 9-4.8 MCG/ACT IN AERO: 2.0000 | INHALATION_SPRAY | Freq: Two times a day (BID) | RESPIRATORY_TRACT | 3 refills | Status: DC

## 2016-04-28 NOTE — Progress Notes (Signed)
Subjective:    Patient ID: Mike Mclaughlin, male    DOB: 14-Nov-1934      MRN: [id number]    Brief patient profile:  64 yowm quit smoking 08/2013 with copd/ 02 at hs  Overall worse quit smoking and GOLD III criteria as of 11/14/2015    History of Present Illness  12/05/2014  Dr Drucie Opitz final note  Chief Complaint  Patient presents with  . Follow-up    Had CT Chest last mth. at [place].c/o sob with exertion,does well inside but outside with humidity,cough-[place],thick x 3-4 wks.,occass. wheezing,c/o chest congestion, denies cp or tightness,no fcs,no pnd.Needs Symbicort refilled today.  No real change . Pt on oxygen 2L qhs and prn daytime  This patient continues to use and benefit from her oxygen therapy and has re qualified at this visit for continued oxygen therapy rec Use symbicort two puff twice daily Obtain repeat CT Chest in one year> no change Continue oxygen at night 2Liters   11/14/2015  Extended transition of care  ov/Mike Mclaughlin re: GOLD III copd/ symb 80  Chief Complaint  Patient presents with  . Follow-up    former PW pt. breathing has worsen, pt c/o increased sob, prod cough w/[place] thick mucus & wheezing. on 2.5-3L qhs & prn  doe = MMRC3 = can't walk 100 yards even at a slow pace at a flat grade s stopping due to sob  Can't complete one aisle at [facility] Cough daily year round worse in am but doesn't wake him noc/ rattling all day long / some better on mucinex dm  Takes prilosec hs rec Plan A = Automatic = Symbicort 160 Take 2 puffs first thing in am and then another 2 puffs about 12 hours later.  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 Follow-up    Breathing is about the same. He is coughing less. He states he  rarely uses albuterol inhaler.   doe still MMRC3  rec Plan A = Automatic = Symbicort 160 Take 2 puffs first thing in am and then another 2 puffs about 12 hours later.  Work on Interior and spatial designer: Plan B = Backup Only use your albuterol (PROAIR HFA) as a rescue medication Continue  prilosec to 20 mg Take 30-60 min before first meal of the day  GERD (diet    04/28/2016  f/u ov/Mike Mclaughlin re: GOLD III copd/ symb 160 2bid / now on 3lpm sleeping only  Chief Complaint  Patient presents with  . Follow-up    Pt c/o increased SOB for the past 2 months. He states "If I walk any at all I get SOB".  He has occ cough with Strick sputum "comes in spells".  He is using rescue inhaler 2-3 x per wk on average.   MMRC3 = can't walk 100 yards even at a slow pace at a flat grade s stopping due to sob  Still HT ok   No obvious patterns in  day to day or daytime variability or assoc  purulent sputum or mucus plugs or hemoptysis or cp or chest tightness, subjective wheeze or overt sinus or hb symptoms. No unusual exp hx or h/o childhood pna/ asthma or  knowledge of premature birth.  Sleeping ok without nocturnal  or early am exacerbation  of respiratory  c/o's or need for noct saba. Also denies any obvious fluctuation of symptoms with weather or environmental changes or other aggravating or alleviating factors except as outlined above   Current Medications, Allergies, Complete Past Medical History, Past Surgical History, Family History, and Social History were reviewed in [employer] record.  ROS  The following are not active complaints unless bolded sore throat, dysphagia, dental problems, itching, sneezing,  nasal congestion or excess/ purulent secretions, ear ache,   fever, chills, sweats, unintended wt loss, classically pleuritic or exertional cp,  orthopnea pnd or leg swelling, presyncope, palpitations, abdominal pain, anorexia, nausea, vomiting, diarrhea  or change in bowel or  bladder habits, change in stools or urine, dysuria,hematuria,  rash, arthralgias, visual complaints, headache, numbness, weakness or ataxia or problems with walking or coordination,  change in mood/affect or memory.           Objective:   Physical Exam    amb wm nad  04/28/2016          208  01/09/2016      204  11/14/15 206 lb (93.4 kg)  12/05/14 208 lb 12.8 oz (94.7 kg)  05/09/14 215 lb (97.5 kg)    Vital signs reviewed - - Note on arrival 02 sats  100% on RA       Gen: Pleasant, well-nourished, in no distress,  normal affect  ENT: No lesions,  mouth clear,  oropharynx clear, no postnasal drip  Neck: No JVD, no TMG, no carotid bruits  Lungs: No use of accessory muscles,  Mild barrel chest  with distant bs bilaterally s wheeze   Cardiovascular: RRR, heart sounds normal, no murmur or gallops, no peripheral edema  Abdomen: soft and NT, no HSM,  BS normal  Musculoskeletal: No deformities, no cyanosis or clubbing  Neuro: alert, non focal  Skin: Warm, no lesions or rashes              Assessment & Plan:

## 2016-04-28 NOTE — Patient Instructions (Signed)
Please see patient coordinator before you leave today  to schedule overnight oxymetry on 3lpm and refer to rehab at [place] = Automatic = stop symbicort and start bevespi Take 2 puffs first thing in am and then another 2 puffs about 12 hours later.   Plan B = Backup Only use your albuterol (proair) as a rescue medication to be used if you can't catch your breath by resting or doing a relaxed purse lip breathing pattern.  - The less you use it, the better it will work when you need it. - Ok to use the inhaler up to 2 puffs  every 4 hours if you must but call for appointment if use goes up over your usual need - Don't leave home without it !!  (think of it like the spare tire for your car)    Please schedule a follow up office visit in 4 weeks, sooner if needed

## 2016-04-29 NOTE — Assessment & Plan Note (Signed)
02  hs only - added humidity 11/14/2015 >>> - 04/28/2016  Walked RA x 3 laps @ 185 ft each stopped due to  end of study, nl pace,  sats down to 88 at the very end and did not correlate with sob which occurred well before sats dropped - Rec repeat ono on 3lpm 04/28/2016 >>>  He has turned the 02 up on his own from 2.5 to 3lpm and need to confirm this is adequate but except with levels of exertion he's not performing at home he does not need amb 02 and the lama/laba should help address his doe. He is a perfect candidate for rehab if he'll go.

## 2016-04-29 NOTE — Assessment & Plan Note (Addendum)
09/2013  Arlyce Harman: FeV1 47% FVC 79% FeV1/FVC 59% No desat with exertion 11/2013 ONO RA>>>mult desat.  On 2L qhs oxygen as of 11/2013 see chronic resp failure  - 01/09/2016    continue symbicort 160 2bid with hfa proair instead of respiclick    - [id number]  After extensive coaching HFA effectiveness =    90% > changed to bevespi x 4 w sample - 04/28/2016 referred to rehab   Pt is Group B in terms of symptom/risk and laba/lama therefore appropriate rx at this point so trial of bevespi needed next and if starts having aecopd then change back to symbicort/spiriva respimat option vs symb/tudorza if prostate issues a concern on lama/ reviewed with pt/wife  I had an extended discussion with the patient reviewing all relevant studies completed to date and  lasting 15 to 20 minutes of a 25 minute visit    Each maintenance medication was reviewed in detail including most importantly the difference between maintenance and prns and under what circumstances the prns are to be triggered using an action plan format that is not reflected in the computer generated alphabetically organized AVS.    Please see AVS for specific instructions unique to this visit that I personally wrote and verbalized to the the pt in detail and then reviewed with pt  by my nurse highlighting any  changes in therapy recommended at today's visit to their plan of care.

## 2016-05-05 ENCOUNTER — Telehealth: Payer: Self-pay | Admitting: Internal Medicine

## 2016-05-05 DIAGNOSIS — J438 Other emphysema: Secondary | ICD-10-CM

## 2016-05-05 NOTE — Telephone Encounter (Signed)
pulm rehab returning call.[name] ° °

## 2016-05-05 NOTE — Telephone Encounter (Signed)
Per Pulmonary Rehab, they will need a new order with a different diagnosis code other the COPD. Pt has not had a recent PFT with bronchodilators.  MW - please advise. Thanks.

## 2016-05-05 NOTE — Telephone Encounter (Signed)
Tried to contact [facility] back, but only able to get a recording left a vm for someone to contact us back, will await call back

## 2016-05-05 NOTE — Telephone Encounter (Signed)
Last spirometry 11/14/15 c/w GOLD III copd, no other dx to offer unless they will accept emphysema.  Also Ok to repeat pfts if needed  - try to do at [facility]

## 2016-05-05 NOTE — Telephone Encounter (Signed)
Spoke with [facility] again they stated we could use emphysema we just need to place a new order. The order was placed. [facility] is aware that the new order is being placed. Nothing further is needed at this time.

## 2016-05-05 NOTE — Telephone Encounter (Signed)
Contacted [facility], Spoke with someone from the desk and she stated she will get the message to see if emphysema will work and if they have any problems then they will contact us back.

## 2016-05-13 DIAGNOSIS — R0902 Hypoxemia: Secondary | ICD-10-CM | POA: Diagnosis not present

## 2016-05-14 ENCOUNTER — Encounter: Payer: Self-pay | Admitting: Internal Medicine

## 2016-05-14 DIAGNOSIS — R0902 Hypoxemia: Secondary | ICD-10-CM | POA: Diagnosis not present

## 2016-05-19 ENCOUNTER — Telehealth: Payer: Self-pay | Admitting: Internal Medicine

## 2016-05-19 NOTE — Telephone Encounter (Signed)
Per MW- ONO on 3lpm done 05/13/16 by [facility] was normal  Needs to continue 3lpm with sleep Spoke with pt and notified of results per Dr. Melvyn Novas. Pt verbalized understanding and denied any questions.

## 2016-05-26 ENCOUNTER — Encounter: Payer: Self-pay | Admitting: Internal Medicine

## 2016-05-26 ENCOUNTER — Ambulatory Visit (INDEPENDENT_AMBULATORY_CARE_PROVIDER_SITE_OTHER): Payer: Medicare Other | Admitting: Internal Medicine

## 2016-05-26 VITALS — BP 122/74 | HR 54 | Ht 70.0 in | Wt 204.2 lb

## 2016-05-26 DIAGNOSIS — J9611 Chronic respiratory failure with hypoxia: Secondary | ICD-10-CM | POA: Diagnosis not present

## 2016-05-26 DIAGNOSIS — J449 Chronic obstructive pulmonary disease, unspecified: Secondary | ICD-10-CM | POA: Diagnosis not present

## 2016-05-26 MED ORDER — BUDESONIDE-FORMOTEROL FUMARATE 160-4.5 MCG/ACT IN AERO: 2.0000 | INHALATION_SPRAY | Freq: Two times a day (BID) | RESPIRATORY_TRACT | 11 refills | Status: DC

## 2016-05-26 NOTE — Progress Notes (Signed)
Subjective:    Patient ID: Mike Mclaughlin, male    DOB: Sep 26, 1934      MRN: [id number]    Brief patient profile:  72 yowm quit smoking 08/2013 with copd/ 02 at hs  Overall worse quit smoking and GOLD III criteria as of 11/14/2015    History of Present Illness  12/05/2014  Dr Drucie Opitz final note  Chief Complaint  Patient presents with  . Follow-up    Had CT Chest last mth. at [facility].c/o sob with exertion,does well inside but outside with humidity,cough-[place]ier,thick x 3-4 wks.,occass. wheezing,c/o chest congestion, denies cp or tightness,no fcs,no pnd.Needs Symbicort refilled today.  No real change . Pt on oxygen 2L qhs and prn daytime  This patient continues to use and benefit from her oxygen therapy and has re qualified at this visit for continued oxygen therapy rec Use symbicort two puff twice daily Obtain repeat CT Chest in one year> no change Continue oxygen at night 2Liters   11/14/2015  Extended transition of care  ov/Kianna Billet re: GOLD III copd/ symb 80  Chief Complaint  Patient presents with  . Follow-up    former PW pt. breathing has worsen, pt c/o increased sob, prod cough w/Bazzi thick mucus & wheezing. on 2.5-3L qhs & prn  doe = MMRC3 = can't walk 100 yards even at a slow pace at a flat grade s stopping due to sob  Can't complete one aisle at [facility] Cough daily year round worse in am but doesn't wake him noc/ rattling all day long / some better on mucinex dm  Takes prilosec hs rec Plan A = Automatic = Symbicort 160 Take 2 puffs first thing in am and then another 2 puffs about 12 hours later.  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 Follow-up    Breathing is about the same. He is coughing less. He states he  rarely uses albuterol inhaler.   doe still MMRC3  rec Plan A = Automatic = Symbicort 160 Take 2 puffs first thing in am and then another 2 puffs about 12 hours later.  Work on Interior and spatial designer: Plan B = Backup Only use your albuterol (PROAIR HFA) as a rescue medication Continue  prilosec to 20 mg Take 30-60 min before first meal of the day  GERD (diet    04/28/2016  f/u ov/Mike Mclaughlin re: GOLD III copd/ symb 160 2bid / now on 3lpm sleeping only  Chief Complaint  Patient presents with  . Follow-up    Pt c/o increased SOB for the past 2 months. He states "If I walk any at all I get SOB".  He has occ cough with Gregson sputum "comes in spells".  He is using rescue inhaler 2-3 x per wk on average.   MMRC3 = can't walk 100 yards even at a slow pace at a flat grade s stopping due to sob  Still HT ok  rec Please see patient coordinator before you leave today  to schedule overnight oxymetry on 3lpm and refer to rehab at [facility] = Automatic = stop symbicort and start bevespi Take 2 puffs first thing in am and then another 2 puffs about  12 hours later.  Plan B = Backup Only use your albuterol (proair) as a rescue medication    05/26/2016  f/u ov/Mike Mclaughlin re: COPD III preferred copd / symb 160 2bid prefers over [employer]  Patient presents with  . Follow-up    Breathing is unchanged. He is not coughing much. He states he use rescue inhaler 4-5 x per wk on average.   walking from house to end of yard is a slope up back to house  and always sob when gets to house, sometimes has to stop   No obvious day to day or daytime variability or assoc excess/ purulent sputum or mucus plugs or hemoptysis or cp or chest tightness, subjective wheeze or overt sinus or hb symptoms. No unusual exp hx or h/o childhood pna/ asthma or knowledge of premature birth.  Sleeping ok without nocturnal  or early am exacerbation  of respiratory  c/o's or need for noct saba. Also denies any obvious  fluctuation of symptoms with weather or environmental changes or other aggravating or alleviating factors except as outlined above   Current Medications, Allergies, Complete Past Medical History, Past Surgical History, Family History, and Social History were reviewed in [employer] record.  ROS  The following are not active complaints unless bolded sore throat, dysphagia, dental problems, itching, sneezing,  nasal congestion or excess/ purulent secretions, ear ache,   fever, chills, sweats, unintended wt loss, classically pleuritic or exertional cp,  orthopnea pnd or leg swelling, presyncope, palpitations, abdominal pain, anorexia, nausea, vomiting, diarrhea  or change in bowel or bladder habits, change in stools or urine, dysuria,hematuria,  rash, arthralgias, visual complaints, headache, numbness, weakness or ataxia or problems with walking or coordination,  change in mood/affect or memory.             Objective:   Physical Exam    amb wm nad  05/26/2016          204  04/28/2016          208  01/09/2016      204  11/14/15 206 lb (93.4 kg)  12/05/14 208 lb 12.8 oz (94.7 kg)  05/09/14 215 lb (97.5 kg)    Vital signs reviewed - - Note on arrival 02 sats  92 % on RA     Gen: Pleasant, well-nourished, in no distress,  normal affect  ENT: No lesions,  mouth clear,  oropharynx clear, no postnasal drip  Neck: No JVD, no TMG, no carotid bruits  Lungs: No use of accessory muscles,  Mild barrel chest  with distant bs bilaterally s true heeze   Cardiovascular: RRR, heart sounds normal, no murmur or gallops, no peripheral edema  Abdomen: soft and NT, no HSM,  BS normal  Musculoskeletal: No deformities, no cyanosis or clubbing  Neuro: alert, non focal  Skin: Warm, no lesions or rashes        Assessment & Plan:   Outpatient Encounter Prescriptions as of 05/26/2016  Medication Sig  . albuterol (PROAIR HFA) 108 (90 Base) MCG/ACT inhaler 2 puffs every 4 hours as  needed only  if your can't catch your breath  . aspirin 81 MG tablet Take 81 mg by mouth daily.  . budesonide-formoterol (SYMBICORT) 160-4.5 MCG/ACT inhaler Inhale 2 puffs into the lungs 2 (two) times daily.  Marland Kitchen dextromethorphan-guaiFENesin (MUCINEX DM) 30-600 MG per 12 hr tablet Take 1 tablet by mouth as needed.   . meloxicam (MOBIC) 7.5 MG tablet Take 7.5 mg by mouth  daily.   . omeprazole (PRILOSEC) 20 MG capsule Take 20 mg by mouth daily.  . OXYGEN 2.5 lpm with sleep  . Tamsulosin HCl (FLOMAX) 0.4 MG CAPS Take 0.4 mg by mouth daily.   . [DISCONTINUED] budesonide-formoterol (SYMBICORT) 160-4.5 MCG/ACT inhaler Inhale 2 puffs into the lungs 2 (two) times daily.  . [DISCONTINUED] Glycopyrrolate-Formoterol (BEVESPI AEROSPHERE) 9-4.8 MCG/ACT AERO Inhale 2 puffs into the lungs 2 (two) times daily.   No facility-administered encounter medications on file as of 05/26/2016.

## 2016-05-26 NOTE — Assessment & Plan Note (Signed)
09/2013  Mike Mclaughlin: FeV1 47% FVC 79% FeV1/FVC 59% No desat with exertion 11/2013 ONO RA>>>mult desat.  On 2L qhs oxygen as of 11/2013 see chronic resp failure  - Spirometry 11/14/15   FEV1 0.97 (33%)  Ratio 49 with typical curvature   - 01/09/2016    continue symbicort 160 2bid with hfa proair instead of respiclick  - [id number]  After extensive coaching HFA effectiveness =    90% > changed to bevespi x 4 w sample - 04/28/2016 referred to rehab  > planning to start 05/27/16   Pt is Group B in terms of symptom/risk and laba/lama therefore appropriate rx at this point but says worse on bevespi vs symbicort so may have more of an ab component than appreciated and if not satisfied with symptom control next step is add spiriva respimat   I had an extended discussion with the patient reviewing all relevant studies completed to date and  lasting 15 to 20 minutes of a 25 minute visit    Each maintenance medication was reviewed in detail including most importantly the difference between maintenance and prns and under what circumstances the prns are to be triggered using an action plan format that is not reflected in the computer generated alphabetically organized AVS.    Please see AVS for specific instructions unique to this visit that I personally wrote and verbalized to the the pt in detail and then reviewed with pt  by my nurse highlighting any  changes in therapy recommended at today's visit to their plan of care.

## 2016-05-26 NOTE — Patient Instructions (Signed)
Keep the appt for the rehab  Increased 02 to 3lpm sleeping    If you are satisfied with your treatment plan,  let your doctor know and he/she can either refill your medications or you can return here when your prescription runs out.     If in any way you are not 100% satisfied,  please tell us.  If 100% better, tell your friends!  Pulmonary follow up is as needed

## 2016-05-26 NOTE — Assessment & Plan Note (Signed)
02  hs only - added humidity 11/14/2015 >>> - 04/28/2016  Walked RA x 3 laps @ 185 ft each stopped due to  end of study, nl pace,  sats down to 88 at the very end and did not correlate with sob which occurred well before sats dropped - Repeat ono on 2.5  lpm  05/13/16 >> desat x 5.4 min so rec change to 3lpm hs only  - starting rehab 05/27/16   Adequate control on present rx, reviewed in detail with pt > no change in rx needed

## 2016-06-03 ENCOUNTER — Telehealth: Payer: Self-pay | Admitting: Internal Medicine

## 2016-06-03 DIAGNOSIS — J449 Chronic obstructive pulmonary disease, unspecified: Secondary | ICD-10-CM | POA: Diagnosis not present

## 2016-06-03 NOTE — Telephone Encounter (Signed)
done

## 2016-06-03 NOTE — Telephone Encounter (Signed)
Form has been received and placed in MW's look at per [facility]. Will route to [facility] for follow up.

## 2016-06-03 NOTE — Telephone Encounter (Signed)
MW, please advise once you have signed, thanks

## 2016-06-03 NOTE — Telephone Encounter (Signed)
Form was signed, faxed and placed in MW's scan folder

## 2016-06-04 DIAGNOSIS — J449 Chronic obstructive pulmonary disease, unspecified: Secondary | ICD-10-CM | POA: Diagnosis not present

## 2016-06-05 DIAGNOSIS — Z79899 Other long term (current) drug therapy: Secondary | ICD-10-CM | POA: Diagnosis not present

## 2016-06-05 DIAGNOSIS — M545 Low back pain: Secondary | ICD-10-CM | POA: Diagnosis not present

## 2016-06-05 DIAGNOSIS — G8929 Other chronic pain: Secondary | ICD-10-CM | POA: Diagnosis not present

## 2016-06-05 DIAGNOSIS — M1610 Unilateral primary osteoarthritis, unspecified hip: Secondary | ICD-10-CM | POA: Diagnosis not present

## 2016-06-05 DIAGNOSIS — E785 Hyperlipidemia, unspecified: Secondary | ICD-10-CM | POA: Diagnosis not present

## 2016-06-05 DIAGNOSIS — J449 Chronic obstructive pulmonary disease, unspecified: Secondary | ICD-10-CM | POA: Diagnosis not present

## 2016-06-05 DIAGNOSIS — Z0000 Encounter for general adult medical examination without abnormal findings: Secondary | ICD-10-CM | POA: Diagnosis not present

## 2016-07-10 DIAGNOSIS — R351 Nocturia: Secondary | ICD-10-CM | POA: Diagnosis not present

## 2016-07-10 DIAGNOSIS — R972 Elevated prostate specific antigen [PSA]: Secondary | ICD-10-CM | POA: Diagnosis not present

## 2016-07-10 DIAGNOSIS — N401 Enlarged prostate with lower urinary tract symptoms: Secondary | ICD-10-CM | POA: Diagnosis not present

## 2017-01-05 DIAGNOSIS — Z23 Encounter for immunization: Secondary | ICD-10-CM | POA: Diagnosis not present

## 2017-01-24 DIAGNOSIS — T18108A Unspecified foreign body in esophagus causing other injury, initial encounter: Secondary | ICD-10-CM | POA: Diagnosis not present

## 2017-01-24 DIAGNOSIS — T18128A Food in esophagus causing other injury, initial encounter: Secondary | ICD-10-CM | POA: Diagnosis not present

## 2017-01-27 ENCOUNTER — Other Ambulatory Visit: Payer: Self-pay | Admitting: Gastroenterology

## 2017-01-27 DIAGNOSIS — K219 Gastro-esophageal reflux disease without esophagitis: Secondary | ICD-10-CM | POA: Diagnosis not present

## 2017-01-27 DIAGNOSIS — R634 Abnormal weight loss: Secondary | ICD-10-CM | POA: Diagnosis not present

## 2017-01-27 DIAGNOSIS — R1310 Dysphagia, unspecified: Secondary | ICD-10-CM | POA: Diagnosis not present

## 2017-01-30 ENCOUNTER — Encounter (HOSPITAL_COMMUNITY)
Admission: RE | Disposition: A | Discharge status: Home / Self Care | Payer: Self-pay | Source: Ambulatory Visit | Attending: Gastroenterology

## 2017-01-30 ENCOUNTER — Encounter (HOSPITAL_COMMUNITY): Payer: Self-pay

## 2017-01-30 ENCOUNTER — Ambulatory Visit (HOSPITAL_COMMUNITY)
Admission: RE | Admit: 2017-01-30 | Discharge: 2017-01-30 | Disposition: A | Discharge status: Home / Self Care | Payer: Medicare Other | Source: Ambulatory Visit | Attending: Gastroenterology | Admitting: Gastroenterology

## 2017-01-30 DIAGNOSIS — K222 Esophageal obstruction: Secondary | ICD-10-CM | POA: Diagnosis not present

## 2017-01-30 DIAGNOSIS — Z85828 Personal history of other malignant neoplasm of skin: Secondary | ICD-10-CM | POA: Diagnosis not present

## 2017-01-30 DIAGNOSIS — K5900 Constipation, unspecified: Secondary | ICD-10-CM | POA: Insufficient documentation

## 2017-01-30 DIAGNOSIS — R1310 Dysphagia, unspecified: Secondary | ICD-10-CM | POA: Diagnosis not present

## 2017-01-30 DIAGNOSIS — K449 Diaphragmatic hernia without obstruction or gangrene: Secondary | ICD-10-CM | POA: Insufficient documentation

## 2017-01-30 DIAGNOSIS — Z7951 Long term (current) use of inhaled steroids: Secondary | ICD-10-CM | POA: Insufficient documentation

## 2017-01-30 DIAGNOSIS — I10 Essential (primary) hypertension: Secondary | ICD-10-CM | POA: Diagnosis not present

## 2017-01-30 DIAGNOSIS — M1990 Unspecified osteoarthritis, unspecified site: Secondary | ICD-10-CM | POA: Insufficient documentation

## 2017-01-30 DIAGNOSIS — B3781 Candidal esophagitis: Secondary | ICD-10-CM | POA: Insufficient documentation

## 2017-01-30 DIAGNOSIS — E782 Mixed hyperlipidemia: Secondary | ICD-10-CM | POA: Insufficient documentation

## 2017-01-30 DIAGNOSIS — Z87891 Personal history of nicotine dependence: Secondary | ICD-10-CM | POA: Diagnosis not present

## 2017-01-30 DIAGNOSIS — Z79899 Other long term (current) drug therapy: Secondary | ICD-10-CM | POA: Insufficient documentation

## 2017-01-30 HISTORY — PX: ESOPHAGOGASTRODUODENOSCOPY: SHX5428

## 2017-01-30 SURGERY — EGD (ESOPHAGOGASTRODUODENOSCOPY)
Anesthesia: Moderate Sedation

## 2017-01-30 MED ORDER — FENTANYL CITRATE (PF) 100 MCG/2ML IJ SOLN
INTRAMUSCULAR | Status: DC | PRN
  Administered 2017-01-30: 25 ug via INTRAVENOUS

## 2017-01-30 MED ORDER — FENTANYL CITRATE (PF) 100 MCG/2ML IJ SOLN
INTRAMUSCULAR | Status: AC
  Filled 2017-01-30: qty 2

## 2017-01-30 MED ORDER — MIDAZOLAM HCL 10 MG/2ML IJ SOLN
INTRAMUSCULAR | Status: DC | PRN
  Administered 2017-01-30: 1 mg via INTRAVENOUS
  Administered 2017-01-30: 2 mg via INTRAVENOUS

## 2017-01-30 MED ORDER — MIDAZOLAM HCL 5 MG/ML IJ SOLN
INTRAMUSCULAR | Status: AC
  Filled 2017-01-30: qty 2

## 2017-01-30 MED ORDER — SODIUM CHLORIDE 0.9 % IV SOLN
INTRAVENOUS | Status: DC
  Administered 2017-01-30: 500 mL via INTRAVENOUS

## 2017-01-30 NOTE — H&P (Signed)
Mike Mclaughlin HPI: This 82 year old Mike Mclaughlin male presents to the office for evaluation of difficulty swallowing solids which started several years ago. He is accompanied to the office by his wife. He usually has 1-2 episodes of dysphagia per month and over the last 3 months the frequency has increased to 1-3 times a week. He was seen in the ED in [facility] on 01/24/2017 after he had a meat impaction that resolved on its own. He has lost 23 pounds in the last 3 months. He admits taking Meloxicam 7.5 mg daily for the last year for joint pain and arthritis. He is currently taking Omeprazole for acid reflux. He has problems with constipation but usually averages 1 BM per day. He usually takes Dulcolax. He has tried Amitiza in the past but discontinued that as it was very expensive. There is no obvious blood or mucus in the stool. He has good appetite. He denies having any complaints of abdominal pain, nausea, vomiting or odynophagia. He denies having a family history of colon cancer, celiac sprue or IBD. His last colonoscopy done on 09/06/2008 revealed internal hemorrhoids and fragments of hyperplastic polyps were removed from the rectum.   Past Medical History:  Diagnosis Date  . Arthritis   . Cardiac dysrhythmia, unspecified   . Dyslipidemia   . Esophageal reflux   . H/O sinus bradycardia   . Hypertension   . Insomnia, unspecified   . Mixed hyperlipidemia   . Rheumatism   . Skin cancer, basal cell     Past Surgical History:  Procedure Laterality Date  . CATARACT EXTRACTION    . hemmorrhoid surgery      Family History  Problem Relation Age of Onset  . Cancer Mother   . Rheumatologic disease Mother   . Other Father        brain tumor  . Stroke Father   . Heart disease Sister     Social History:  reports that he quit smoking about 3 years ago. His smoking use included cigarettes. He has a 7.50 pack-year smoking history. he has never used smokeless tobacco. He reports that he does not drink  alcohol or use drugs.  Allergies: No Known Allergies  Medications:  Scheduled:  Continuous: . sodium chloride 500 mL (01/30/17 1135)    No results found for this or any previous visit (from the past 24 hour(s)).   No results found.  ROS:  As stated above in the HPI otherwise negative.  Blood pressure (!) 205/76, pulse 62, temperature 97.7 F (36.5 C), temperature source Oral, resp. rate 16, height 5\' 10"  (1.778 m), weight 92.5 kg (204 lb), SpO2 99 %.    PE: Gen: NAD, Alert and Oriented HEENT:  [place]/AT, EOMI Neck: Supple, no LAD Lungs: CTA Bilaterally CV: RRR without M/G/R ABM: Soft, NTND, +BS Ext: No C/C/E  Assessment/Plan: 1) Dysphagia - EGD with dilation. 2) Recent meat impaction.    Nazair Fortenberry D 01/30/2017, 12:10 PM

## 2017-01-30 NOTE — Discharge Instructions (Signed)

## 2017-01-30 NOTE — Op Note (Signed)
[facility] Patient Name: Mike Mclaughlin Procedure Date: 01/30/2017 MRN: [id number] Attending MD: Carol Ada , MD Date of Birth: May 10, 1934 CSN: [id number] Age: 82 Admit Type: Outpatient Procedure:                Upper GI endoscopy Indications:              Dysphagia Providers:                Carol Ada, MD, Angus Seller, Corliss Parish,                            Technician Referring MD:              Medicines:                Fentanyl 25 micrograms IV, Midazolam 3 mg IV Complications:            No immediate complications. Estimated Blood Loss:     Estimated blood loss was minimal. Procedure:                Pre-Anesthesia Assessment:                           - Prior to the procedure, a History and Physical                            was performed, and patient medications and                            allergies were reviewed. The patient's tolerance of                            previous anesthesia was also reviewed. The risks                            and benefits of the procedure and the sedation                            options and risks were discussed with the patient.                            All questions were answered, and informed consent                            was obtained. Prior Anticoagulants: The patient has                            taken no previous anticoagulant or antiplatelet                            agents. ASA Grade Assessment: II - A patient with                            mild systemic disease. After reviewing the risks  and benefits, the patient was deemed in                            satisfactory condition to undergo the procedure.                           - Sedation was administered by an anesthesia                            professional. Deep sedation was attained.                           After obtaining informed consent, the endoscope was                            passed under direct vision.  Throughout the                            procedure, the patient's blood pressure, pulse, and                            oxygen saturations were monitored continuously. The                            EG-[id number] ([id number]) scope was introduced through the                            mouth, and advanced to the second part of duodenum.                            The upper GI endoscopy was accomplished without                            difficulty. The patient tolerated the procedure                            well. Scope In: Scope Out: Findings:      One moderate benign-appearing, intrinsic stenosis was found. This       measured 1.4 cm (inner diameter) x less than one cm (in length) and was       traversed. A TTS dilator was passed through the scope. Dilation with a       15-16.5-18 mm balloon dilator was performed to 16.5 mm. The dilation       site was examined and showed moderate improvement in luminal narrowing.       Estimated blood loss was minimal.      A 4 cm hiatal hernia was present.      Patchy candidiasis was found in the lower third of the esophagus.      The stomach was normal.      The examined duodenum was normal.      In the distal esophagus a benign stricture was identified. At 15 mm the       balloon was not able to be moved freely. Deflating the balloon did       reveal the expected mucosal break, but it  was very minor. The balloon       was then increased up to 16.5 mm and greater mucosal disruption was       achieved. The candidal esophagitis was mild and this is most likely a       sequalae of his recent and frequent food impactions. Most likely this       does not need to be treated. Impression:               - Benign-appearing esophageal stenosis. Dilated.                           - 4 cm hiatal hernia.                           - Monilial esophagitis.                           - Normal stomach.                           - Normal examined duodenum.                            - No specimens collected. Moderate Sedation:      Moderate (conscious) sedation was administered by the endoscopy nurse       and supervised by the endoscopist. The following parameters were       monitored: oxygen saturation, heart rate, blood pressure, and response       to care. Recommendation:           - Patient has a contact number available for                            emergencies. The signs and symptoms of potential                            delayed complications were discussed with the                            patient. Return to normal activities tomorrow.                            Written discharge instructions were provided to the                            patient.                           - Resume previous diet.                           - Continue present medications.                           - Follow up with Dr. Collene Mares in 1 month. Procedure Code(s):        --- Professional ---  [phone], Esophagogastroduodenoscopy, flexible,                            transoral; with transendoscopic balloon dilation of                            esophagus (less than 30 mm diameter) Diagnosis Code(s):        --- Professional ---                           K22.2, Esophageal obstruction                           K44.9, Diaphragmatic hernia without obstruction or                            gangrene                           B37.81, Candidal esophagitis                           R13.10, Dysphagia, unspecified CPT copyright 2016 American Medical Association. All rights reserved. The codes documented in this report are preliminary and upon coder review may  be revised to meet current compliance requirements. Carol Ada, MD Carol Ada, MD 01/30/2017 1:01:18 PM This report has been signed electronically. Number of Addenda: 0

## 2017-02-02 ENCOUNTER — Encounter (HOSPITAL_COMMUNITY): Payer: Self-pay | Admitting: Gastroenterology

## 2017-02-05 ENCOUNTER — Other Ambulatory Visit: Payer: Self-pay | Admitting: Internal Medicine

## 2017-02-05 DIAGNOSIS — J449 Chronic obstructive pulmonary disease, unspecified: Secondary | ICD-10-CM

## 2017-02-24 DIAGNOSIS — K219 Gastro-esophageal reflux disease without esophagitis: Secondary | ICD-10-CM | POA: Diagnosis not present

## 2017-02-24 DIAGNOSIS — K5904 Chronic idiopathic constipation: Secondary | ICD-10-CM | POA: Diagnosis not present

## 2017-02-26 DIAGNOSIS — M1611 Unilateral primary osteoarthritis, right hip: Secondary | ICD-10-CM | POA: Diagnosis not present

## 2017-03-05 DIAGNOSIS — M1611 Unilateral primary osteoarthritis, right hip: Secondary | ICD-10-CM | POA: Diagnosis not present

## 2017-03-21 DIAGNOSIS — J209 Acute bronchitis, unspecified: Secondary | ICD-10-CM | POA: Diagnosis not present

## 2017-03-31 ENCOUNTER — Other Ambulatory Visit: Payer: Self-pay | Admitting: Internal Medicine

## 2017-03-31 DIAGNOSIS — J449 Chronic obstructive pulmonary disease, unspecified: Secondary | ICD-10-CM

## 2017-06-10 DIAGNOSIS — M1610 Unilateral primary osteoarthritis, unspecified hip: Secondary | ICD-10-CM | POA: Diagnosis not present

## 2017-06-10 DIAGNOSIS — E782 Mixed hyperlipidemia: Secondary | ICD-10-CM | POA: Diagnosis not present

## 2017-06-10 DIAGNOSIS — I10 Essential (primary) hypertension: Secondary | ICD-10-CM | POA: Diagnosis not present

## 2017-06-10 DIAGNOSIS — K5904 Chronic idiopathic constipation: Secondary | ICD-10-CM | POA: Diagnosis not present

## 2017-06-10 DIAGNOSIS — Z79899 Other long term (current) drug therapy: Secondary | ICD-10-CM | POA: Diagnosis not present

## 2017-06-10 DIAGNOSIS — J449 Chronic obstructive pulmonary disease, unspecified: Secondary | ICD-10-CM | POA: Diagnosis not present

## 2017-07-08 DIAGNOSIS — Z6826 Body mass index (BMI) 26.0-26.9, adult: Secondary | ICD-10-CM | POA: Diagnosis not present

## 2017-07-08 DIAGNOSIS — I10 Essential (primary) hypertension: Secondary | ICD-10-CM | POA: Diagnosis not present

## 2017-07-08 DIAGNOSIS — Z9181 History of falling: Secondary | ICD-10-CM | POA: Diagnosis not present

## 2017-07-08 DIAGNOSIS — M1610 Unilateral primary osteoarthritis, unspecified hip: Secondary | ICD-10-CM | POA: Diagnosis not present

## 2017-07-21 DIAGNOSIS — R972 Elevated prostate specific antigen [PSA]: Secondary | ICD-10-CM | POA: Diagnosis not present

## 2017-07-21 DIAGNOSIS — N401 Enlarged prostate with lower urinary tract symptoms: Secondary | ICD-10-CM | POA: Diagnosis not present

## 2017-07-21 DIAGNOSIS — R351 Nocturia: Secondary | ICD-10-CM | POA: Diagnosis not present

## 2017-08-03 DIAGNOSIS — I10 Essential (primary) hypertension: Secondary | ICD-10-CM | POA: Diagnosis not present

## 2017-08-03 DIAGNOSIS — M1610 Unilateral primary osteoarthritis, unspecified hip: Secondary | ICD-10-CM | POA: Diagnosis not present

## 2017-08-03 DIAGNOSIS — Z6826 Body mass index (BMI) 26.0-26.9, adult: Secondary | ICD-10-CM | POA: Diagnosis not present

## 2017-08-27 DIAGNOSIS — M1611 Unilateral primary osteoarthritis, right hip: Secondary | ICD-10-CM | POA: Diagnosis not present

## 2017-09-04 DIAGNOSIS — Z01818 Encounter for other preprocedural examination: Secondary | ICD-10-CM | POA: Diagnosis not present

## 2017-09-04 DIAGNOSIS — G8929 Other chronic pain: Secondary | ICD-10-CM

## 2017-09-04 DIAGNOSIS — Q6589 Other specified congenital deformities of hip: Secondary | ICD-10-CM | POA: Diagnosis not present

## 2017-09-04 DIAGNOSIS — M25551 Pain in right hip: Secondary | ICD-10-CM | POA: Diagnosis not present

## 2017-09-04 DIAGNOSIS — R001 Bradycardia, unspecified: Secondary | ICD-10-CM

## 2017-09-04 DIAGNOSIS — M25561 Pain in right knee: Secondary | ICD-10-CM | POA: Insufficient documentation

## 2017-09-04 DIAGNOSIS — M1611 Unilateral primary osteoarthritis, right hip: Secondary | ICD-10-CM | POA: Diagnosis not present

## 2017-09-04 DIAGNOSIS — M1711 Unilateral primary osteoarthritis, right knee: Secondary | ICD-10-CM | POA: Diagnosis not present

## 2017-09-04 DIAGNOSIS — I70201 Unspecified atherosclerosis of native arteries of extremities, right leg: Secondary | ICD-10-CM | POA: Diagnosis not present

## 2017-09-04 HISTORY — DX: Bradycardia, unspecified: R00.1

## 2017-09-04 HISTORY — DX: Other chronic pain: G89.29

## 2017-10-05 DIAGNOSIS — J449 Chronic obstructive pulmonary disease, unspecified: Secondary | ICD-10-CM | POA: Diagnosis not present

## 2017-10-05 DIAGNOSIS — Z01818 Encounter for other preprocedural examination: Secondary | ICD-10-CM | POA: Diagnosis not present

## 2017-10-05 DIAGNOSIS — M1611 Unilateral primary osteoarthritis, right hip: Secondary | ICD-10-CM | POA: Diagnosis not present

## 2017-10-05 DIAGNOSIS — Z87891 Personal history of nicotine dependence: Secondary | ICD-10-CM | POA: Diagnosis not present

## 2017-10-12 DIAGNOSIS — R9431 Abnormal electrocardiogram [ECG] [EKG]: Secondary | ICD-10-CM | POA: Diagnosis not present

## 2017-10-12 DIAGNOSIS — N400 Enlarged prostate without lower urinary tract symptoms: Secondary | ICD-10-CM | POA: Diagnosis not present

## 2017-10-12 DIAGNOSIS — Z87891 Personal history of nicotine dependence: Secondary | ICD-10-CM | POA: Diagnosis not present

## 2017-10-12 DIAGNOSIS — Z9981 Dependence on supplemental oxygen: Secondary | ICD-10-CM | POA: Diagnosis not present

## 2017-10-12 DIAGNOSIS — J449 Chronic obstructive pulmonary disease, unspecified: Secondary | ICD-10-CM | POA: Diagnosis not present

## 2017-10-12 DIAGNOSIS — K219 Gastro-esophageal reflux disease without esophagitis: Secondary | ICD-10-CM | POA: Diagnosis not present

## 2017-10-12 DIAGNOSIS — I10 Essential (primary) hypertension: Secondary | ICD-10-CM | POA: Diagnosis not present

## 2017-10-12 DIAGNOSIS — Z7982 Long term (current) use of aspirin: Secondary | ICD-10-CM | POA: Diagnosis not present

## 2017-10-12 DIAGNOSIS — Z79899 Other long term (current) drug therapy: Secondary | ICD-10-CM | POA: Diagnosis not present

## 2017-10-12 DIAGNOSIS — M1611 Unilateral primary osteoarthritis, right hip: Secondary | ICD-10-CM | POA: Diagnosis not present

## 2017-10-12 DIAGNOSIS — R001 Bradycardia, unspecified: Secondary | ICD-10-CM | POA: Diagnosis not present

## 2017-10-12 DIAGNOSIS — Z91048 Other nonmedicinal substance allergy status: Secondary | ICD-10-CM | POA: Diagnosis not present

## 2017-10-12 DIAGNOSIS — Z7951 Long term (current) use of inhaled steroids: Secondary | ICD-10-CM | POA: Diagnosis not present

## 2017-10-21 DIAGNOSIS — Z01811 Encounter for preprocedural respiratory examination: Secondary | ICD-10-CM | POA: Diagnosis not present

## 2017-10-21 DIAGNOSIS — M1611 Unilateral primary osteoarthritis, right hip: Secondary | ICD-10-CM | POA: Diagnosis not present

## 2017-10-21 DIAGNOSIS — J449 Chronic obstructive pulmonary disease, unspecified: Secondary | ICD-10-CM | POA: Diagnosis not present

## 2017-10-21 DIAGNOSIS — Z87891 Personal history of nicotine dependence: Secondary | ICD-10-CM | POA: Diagnosis not present

## 2017-10-21 DIAGNOSIS — J9611 Chronic respiratory failure with hypoxia: Secondary | ICD-10-CM | POA: Diagnosis not present

## 2017-10-21 DIAGNOSIS — Z79899 Other long term (current) drug therapy: Secondary | ICD-10-CM | POA: Diagnosis not present

## 2017-11-03 DIAGNOSIS — Z9981 Dependence on supplemental oxygen: Secondary | ICD-10-CM | POA: Diagnosis not present

## 2017-11-03 DIAGNOSIS — M1611 Unilateral primary osteoarthritis, right hip: Secondary | ICD-10-CM | POA: Diagnosis not present

## 2017-11-03 DIAGNOSIS — Z96641 Presence of right artificial hip joint: Secondary | ICD-10-CM | POA: Diagnosis not present

## 2017-11-03 DIAGNOSIS — K219 Gastro-esophageal reflux disease without esophagitis: Secondary | ICD-10-CM | POA: Diagnosis present

## 2017-11-03 DIAGNOSIS — Z87891 Personal history of nicotine dependence: Secondary | ICD-10-CM | POA: Diagnosis not present

## 2017-11-03 DIAGNOSIS — Z7982 Long term (current) use of aspirin: Secondary | ICD-10-CM | POA: Diagnosis not present

## 2017-11-03 DIAGNOSIS — Z79899 Other long term (current) drug therapy: Secondary | ICD-10-CM | POA: Diagnosis not present

## 2017-11-03 DIAGNOSIS — Z7951 Long term (current) use of inhaled steroids: Secondary | ICD-10-CM | POA: Diagnosis not present

## 2017-11-03 DIAGNOSIS — J449 Chronic obstructive pulmonary disease, unspecified: Secondary | ICD-10-CM | POA: Diagnosis present

## 2017-11-03 DIAGNOSIS — I10 Essential (primary) hypertension: Secondary | ICD-10-CM | POA: Diagnosis present

## 2017-11-03 DIAGNOSIS — M25551 Pain in right hip: Secondary | ICD-10-CM | POA: Diagnosis not present

## 2017-11-03 DIAGNOSIS — N400 Enlarged prostate without lower urinary tract symptoms: Secondary | ICD-10-CM | POA: Diagnosis present

## 2017-11-03 DIAGNOSIS — Z471 Aftercare following joint replacement surgery: Secondary | ICD-10-CM | POA: Diagnosis not present

## 2017-11-03 DIAGNOSIS — G8918 Other acute postprocedural pain: Secondary | ICD-10-CM | POA: Diagnosis not present

## 2017-11-03 DIAGNOSIS — Z91048 Other nonmedicinal substance allergy status: Secondary | ICD-10-CM | POA: Diagnosis not present

## 2017-11-06 DIAGNOSIS — Z96641 Presence of right artificial hip joint: Secondary | ICD-10-CM | POA: Diagnosis not present

## 2017-11-09 DIAGNOSIS — Z96641 Presence of right artificial hip joint: Secondary | ICD-10-CM | POA: Diagnosis not present

## 2017-11-11 DIAGNOSIS — Z96641 Presence of right artificial hip joint: Secondary | ICD-10-CM | POA: Diagnosis not present

## 2017-11-13 DIAGNOSIS — Z96641 Presence of right artificial hip joint: Secondary | ICD-10-CM | POA: Diagnosis not present

## 2017-11-16 DIAGNOSIS — Z96641 Presence of right artificial hip joint: Secondary | ICD-10-CM | POA: Diagnosis not present

## 2017-11-17 DIAGNOSIS — Z96641 Presence of right artificial hip joint: Secondary | ICD-10-CM | POA: Diagnosis not present

## 2017-11-18 DIAGNOSIS — Z96641 Presence of right artificial hip joint: Secondary | ICD-10-CM | POA: Insufficient documentation

## 2017-11-18 DIAGNOSIS — Z471 Aftercare following joint replacement surgery: Secondary | ICD-10-CM | POA: Diagnosis not present

## 2017-11-18 HISTORY — DX: Presence of right artificial hip joint: Z96.641

## 2017-11-20 DIAGNOSIS — Z96641 Presence of right artificial hip joint: Secondary | ICD-10-CM | POA: Diagnosis not present

## 2017-11-24 DIAGNOSIS — Z96641 Presence of right artificial hip joint: Secondary | ICD-10-CM | POA: Diagnosis not present

## 2017-11-26 DIAGNOSIS — Z96641 Presence of right artificial hip joint: Secondary | ICD-10-CM | POA: Diagnosis not present

## 2017-11-30 DIAGNOSIS — M79604 Pain in right leg: Secondary | ICD-10-CM | POA: Diagnosis not present

## 2017-12-02 DIAGNOSIS — Z96641 Presence of right artificial hip joint: Secondary | ICD-10-CM | POA: Diagnosis not present

## 2017-12-04 DIAGNOSIS — Z96641 Presence of right artificial hip joint: Secondary | ICD-10-CM | POA: Diagnosis not present

## 2017-12-07 DIAGNOSIS — Z96641 Presence of right artificial hip joint: Secondary | ICD-10-CM | POA: Diagnosis not present

## 2017-12-09 DIAGNOSIS — Z96641 Presence of right artificial hip joint: Secondary | ICD-10-CM | POA: Diagnosis not present

## 2017-12-14 DIAGNOSIS — Z96641 Presence of right artificial hip joint: Secondary | ICD-10-CM | POA: Diagnosis not present

## 2017-12-16 DIAGNOSIS — Z471 Aftercare following joint replacement surgery: Secondary | ICD-10-CM | POA: Diagnosis not present

## 2017-12-16 DIAGNOSIS — Z96641 Presence of right artificial hip joint: Secondary | ICD-10-CM | POA: Diagnosis not present

## 2017-12-22 DIAGNOSIS — Z96641 Presence of right artificial hip joint: Secondary | ICD-10-CM | POA: Diagnosis not present

## 2017-12-24 DIAGNOSIS — Z96641 Presence of right artificial hip joint: Secondary | ICD-10-CM | POA: Diagnosis not present

## 2018-01-12 DIAGNOSIS — Z23 Encounter for immunization: Secondary | ICD-10-CM | POA: Diagnosis not present

## 2018-01-19 DIAGNOSIS — J449 Chronic obstructive pulmonary disease, unspecified: Secondary | ICD-10-CM | POA: Diagnosis not present

## 2018-01-19 DIAGNOSIS — I10 Essential (primary) hypertension: Secondary | ICD-10-CM | POA: Diagnosis not present

## 2018-01-19 DIAGNOSIS — K219 Gastro-esophageal reflux disease without esophagitis: Secondary | ICD-10-CM | POA: Diagnosis not present

## 2018-01-19 DIAGNOSIS — M1610 Unilateral primary osteoarthritis, unspecified hip: Secondary | ICD-10-CM | POA: Diagnosis not present

## 2018-02-15 DIAGNOSIS — M8588 Other specified disorders of bone density and structure, other site: Secondary | ICD-10-CM | POA: Diagnosis not present

## 2018-02-15 DIAGNOSIS — M533 Sacrococcygeal disorders, not elsewhere classified: Secondary | ICD-10-CM | POA: Diagnosis not present

## 2018-02-15 DIAGNOSIS — Z96641 Presence of right artificial hip joint: Secondary | ICD-10-CM | POA: Diagnosis not present

## 2018-02-15 DIAGNOSIS — M1612 Unilateral primary osteoarthritis, left hip: Secondary | ICD-10-CM | POA: Diagnosis not present

## 2018-02-15 DIAGNOSIS — Z471 Aftercare following joint replacement surgery: Secondary | ICD-10-CM | POA: Diagnosis not present

## 2018-03-12 DIAGNOSIS — M25551 Pain in right hip: Secondary | ICD-10-CM | POA: Diagnosis not present

## 2018-03-12 DIAGNOSIS — Z96641 Presence of right artificial hip joint: Secondary | ICD-10-CM | POA: Diagnosis not present

## 2018-03-12 DIAGNOSIS — M79651 Pain in right thigh: Secondary | ICD-10-CM | POA: Diagnosis not present

## 2018-04-05 DIAGNOSIS — Z96641 Presence of right artificial hip joint: Secondary | ICD-10-CM | POA: Diagnosis not present

## 2018-04-05 DIAGNOSIS — M25551 Pain in right hip: Secondary | ICD-10-CM | POA: Diagnosis not present

## 2018-04-05 DIAGNOSIS — Z471 Aftercare following joint replacement surgery: Secondary | ICD-10-CM | POA: Diagnosis not present

## 2018-04-08 DIAGNOSIS — M25551 Pain in right hip: Secondary | ICD-10-CM | POA: Diagnosis not present

## 2018-04-14 DIAGNOSIS — M25551 Pain in right hip: Secondary | ICD-10-CM | POA: Diagnosis not present

## 2018-04-20 DIAGNOSIS — M25551 Pain in right hip: Secondary | ICD-10-CM | POA: Diagnosis not present

## 2018-05-05 DIAGNOSIS — Z87891 Personal history of nicotine dependence: Secondary | ICD-10-CM | POA: Diagnosis not present

## 2018-05-05 DIAGNOSIS — R55 Syncope and collapse: Secondary | ICD-10-CM | POA: Diagnosis not present

## 2018-05-05 DIAGNOSIS — J9611 Chronic respiratory failure with hypoxia: Secondary | ICD-10-CM | POA: Diagnosis not present

## 2018-05-05 DIAGNOSIS — J449 Chronic obstructive pulmonary disease, unspecified: Secondary | ICD-10-CM | POA: Diagnosis not present

## 2018-05-05 DIAGNOSIS — R001 Bradycardia, unspecified: Secondary | ICD-10-CM | POA: Diagnosis not present

## 2018-05-07 DIAGNOSIS — Z6827 Body mass index (BMI) 27.0-27.9, adult: Secondary | ICD-10-CM | POA: Diagnosis not present

## 2018-05-07 DIAGNOSIS — R001 Bradycardia, unspecified: Secondary | ICD-10-CM | POA: Diagnosis not present

## 2018-05-07 DIAGNOSIS — F17201 Nicotine dependence, unspecified, in remission: Secondary | ICD-10-CM | POA: Diagnosis not present

## 2018-05-17 DIAGNOSIS — R001 Bradycardia, unspecified: Secondary | ICD-10-CM | POA: Diagnosis not present

## 2018-06-02 DIAGNOSIS — Z471 Aftercare following joint replacement surgery: Secondary | ICD-10-CM | POA: Diagnosis not present

## 2018-06-02 DIAGNOSIS — Z96641 Presence of right artificial hip joint: Secondary | ICD-10-CM | POA: Diagnosis not present

## 2018-06-11 ENCOUNTER — Ambulatory Visit: Payer: Medicare Other | Admitting: Cardiology

## 2018-06-15 ENCOUNTER — Telehealth: Payer: Self-pay

## 2018-06-15 NOTE — Telephone Encounter (Signed)
New pt referral. Dr.RRR reviewed and advised patient is asymptomatic and ok to see in 4-6 weeks. D.Rocklyn Mayberry, BSN RN contacted Caryl Pina (Dr. Dionicio Stall nurse). PCP advised that paperwork has been received and patient placed in cue until COVID protocols are lifted for 4-6 week appt.

## 2018-07-08 ENCOUNTER — Telehealth: Payer: Self-pay | Admitting: *Deleted

## 2018-07-08 NOTE — Telephone Encounter (Signed)
Checked in on pt to make sure he is doing okay. Pt states is doing very well, no symptoms at this time. Pt would like a call back in the future once all this covid 19 stuff dies down.

## 2018-07-16 NOTE — Telephone Encounter (Signed)
   Staff member spoke to pt on 04/16  "Checked in on pt to make sure he is doing okay. Pt states is doing very well, no symptoms at this time. Pt would like a call back in the future once all this covid 19 stuff dies down."  Will not attempt to schedule him at this time.  Rosaria Ferries, PA-C 07/16/2018 10:55 AM Beeper [phone]

## 2018-09-14 DIAGNOSIS — J449 Chronic obstructive pulmonary disease, unspecified: Secondary | ICD-10-CM | POA: Diagnosis not present

## 2018-09-14 DIAGNOSIS — Z0000 Encounter for general adult medical examination without abnormal findings: Secondary | ICD-10-CM | POA: Diagnosis not present

## 2018-09-14 DIAGNOSIS — E785 Hyperlipidemia, unspecified: Secondary | ICD-10-CM | POA: Diagnosis not present

## 2018-09-14 DIAGNOSIS — K5904 Chronic idiopathic constipation: Secondary | ICD-10-CM | POA: Diagnosis not present

## 2018-09-14 DIAGNOSIS — R001 Bradycardia, unspecified: Secondary | ICD-10-CM | POA: Diagnosis not present

## 2018-09-14 DIAGNOSIS — Z79899 Other long term (current) drug therapy: Secondary | ICD-10-CM | POA: Diagnosis not present

## 2018-09-14 DIAGNOSIS — I10 Essential (primary) hypertension: Secondary | ICD-10-CM | POA: Diagnosis not present

## 2018-10-05 DIAGNOSIS — Z1211 Encounter for screening for malignant neoplasm of colon: Secondary | ICD-10-CM | POA: Diagnosis not present

## 2018-10-06 DIAGNOSIS — J449 Chronic obstructive pulmonary disease, unspecified: Secondary | ICD-10-CM | POA: Diagnosis not present

## 2018-10-06 DIAGNOSIS — R0600 Dyspnea, unspecified: Secondary | ICD-10-CM

## 2018-10-06 DIAGNOSIS — R5381 Other malaise: Secondary | ICD-10-CM | POA: Insufficient documentation

## 2018-10-06 DIAGNOSIS — R001 Bradycardia, unspecified: Secondary | ICD-10-CM | POA: Diagnosis not present

## 2018-10-06 DIAGNOSIS — R0609 Other forms of dyspnea: Secondary | ICD-10-CM

## 2018-10-06 DIAGNOSIS — R5383 Other fatigue: Secondary | ICD-10-CM | POA: Diagnosis not present

## 2018-10-06 DIAGNOSIS — E785 Hyperlipidemia, unspecified: Secondary | ICD-10-CM | POA: Diagnosis not present

## 2018-10-06 HISTORY — DX: Dyspnea, unspecified: R06.00

## 2018-10-06 HISTORY — DX: Other malaise: R53.81

## 2018-10-06 HISTORY — DX: Other forms of dyspnea: R06.09

## 2018-10-14 DIAGNOSIS — R001 Bradycardia, unspecified: Secondary | ICD-10-CM | POA: Diagnosis not present

## 2018-10-14 DIAGNOSIS — R5383 Other fatigue: Secondary | ICD-10-CM | POA: Diagnosis not present

## 2018-10-14 DIAGNOSIS — R5381 Other malaise: Secondary | ICD-10-CM | POA: Diagnosis not present

## 2018-10-20 ENCOUNTER — Other Ambulatory Visit: Payer: Self-pay

## 2018-10-20 DIAGNOSIS — Z9981 Dependence on supplemental oxygen: Secondary | ICD-10-CM | POA: Diagnosis not present

## 2018-10-20 DIAGNOSIS — I10 Essential (primary) hypertension: Secondary | ICD-10-CM | POA: Diagnosis not present

## 2018-10-20 DIAGNOSIS — I272 Pulmonary hypertension, unspecified: Secondary | ICD-10-CM | POA: Diagnosis not present

## 2018-10-20 DIAGNOSIS — J449 Chronic obstructive pulmonary disease, unspecified: Secondary | ICD-10-CM | POA: Diagnosis not present

## 2018-10-20 DIAGNOSIS — R001 Bradycardia, unspecified: Secondary | ICD-10-CM | POA: Diagnosis not present

## 2018-11-11 DIAGNOSIS — R5383 Other fatigue: Secondary | ICD-10-CM | POA: Diagnosis not present

## 2018-11-11 DIAGNOSIS — R5381 Other malaise: Secondary | ICD-10-CM | POA: Diagnosis not present

## 2018-11-11 DIAGNOSIS — R001 Bradycardia, unspecified: Secondary | ICD-10-CM | POA: Diagnosis not present

## 2018-11-13 DIAGNOSIS — R001 Bradycardia, unspecified: Secondary | ICD-10-CM | POA: Diagnosis not present

## 2018-11-22 DIAGNOSIS — Z96641 Presence of right artificial hip joint: Secondary | ICD-10-CM | POA: Diagnosis not present

## 2018-11-22 DIAGNOSIS — Z471 Aftercare following joint replacement surgery: Secondary | ICD-10-CM | POA: Diagnosis not present

## 2018-11-22 DIAGNOSIS — M533 Sacrococcygeal disorders, not elsewhere classified: Secondary | ICD-10-CM | POA: Diagnosis not present

## 2018-12-30 DIAGNOSIS — N401 Enlarged prostate with lower urinary tract symptoms: Secondary | ICD-10-CM | POA: Diagnosis not present

## 2018-12-30 DIAGNOSIS — R351 Nocturia: Secondary | ICD-10-CM | POA: Diagnosis not present

## 2018-12-30 DIAGNOSIS — R972 Elevated prostate specific antigen [PSA]: Secondary | ICD-10-CM | POA: Diagnosis not present

## 2019-01-19 DIAGNOSIS — Z23 Encounter for immunization: Secondary | ICD-10-CM | POA: Diagnosis not present

## 2019-01-20 ENCOUNTER — Telehealth: Payer: Self-pay

## 2019-01-20 NOTE — Telephone Encounter (Signed)
-----   Message from Jenean Lindau, MD sent at 01/19/2019 10:08 AM EDT ----- I Am not sure either I must have received a report of the monitor and hence make that decision.  Please look into it and if the patient is followed by his own cardiologist I am fine with it. ----- Message ----- From: Beckey Rutter, RN Sent: 01/19/2019   9:41 AM EDT To: Jenean Lindau, MD, Janine Limbo  Dr. Docia Furl, Mr. Calverley said it was 2 months ago he had a monitor and he has already spoke to his primary about the monitor so he is asking why he all of a sudden needs to see RRR - he's never seen RRR before?   Bartolo Darter ----- Message ----- From: Jenean Lindau, MD Sent: 01/14/2019  11:31 PM EDT To: Rise Patience, Beckey Rutter, RN  I assume this patient has an appointment coming up with me. ----- Message ----- From: Rise Patience Sent: 01/14/2019   3:41 PM EDT To: Jenean Lindau, MD

## 2019-01-20 NOTE — Telephone Encounter (Signed)
Patient is currently already under cardiology care but advised to call office if he would like to transition. No further questions at this time.

## 2019-02-01 DIAGNOSIS — L578 Other skin changes due to chronic exposure to nonionizing radiation: Secondary | ICD-10-CM | POA: Diagnosis not present

## 2019-02-01 DIAGNOSIS — D485 Neoplasm of uncertain behavior of skin: Secondary | ICD-10-CM | POA: Diagnosis not present

## 2019-04-04 ENCOUNTER — Institutional Professional Consult (permissible substitution): Payer: Medicare Other | Admitting: Pulmonary Disease

## 2019-04-13 ENCOUNTER — Other Ambulatory Visit: Payer: Self-pay

## 2019-04-13 ENCOUNTER — Ambulatory Visit (INDEPENDENT_AMBULATORY_CARE_PROVIDER_SITE_OTHER): Payer: Medicare Other | Admitting: Pulmonary Disease

## 2019-04-13 ENCOUNTER — Encounter: Payer: Self-pay | Admitting: Pulmonary Disease

## 2019-04-13 VITALS — BP 150/70 | HR 61 | Temp 97.3°F | Ht 70.0 in | Wt 199.6 lb

## 2019-04-13 DIAGNOSIS — J449 Chronic obstructive pulmonary disease, unspecified: Secondary | ICD-10-CM

## 2019-04-13 DIAGNOSIS — J9611 Chronic respiratory failure with hypoxia: Secondary | ICD-10-CM | POA: Diagnosis not present

## 2019-04-13 LAB — CBC WITH DIFFERENTIAL/PLATELET
Basophils Absolute: 0.1 10*3/uL (ref 0.0–0.1)
Basophils Relative: 1.4 % (ref 0.0–3.0)
Eosinophils Absolute: 0.2 10*3/uL (ref 0.0–0.7)
Eosinophils Relative: 4.3 % (ref 0.0–5.0)
HCT: 37.8 % — ABNORMAL LOW (ref 39.0–52.0)
Hemoglobin: 11.8 g/dL — ABNORMAL LOW (ref 13.0–17.0)
Lymphocytes Relative: 24.4 % (ref 12.0–46.0)
Lymphs Abs: 1.4 10*3/uL (ref 0.7–4.0)
MCHC: 31.4 g/dL (ref 30.0–36.0)
MCV: 85.3 fl (ref 78.0–100.0)
Monocytes Absolute: 0.6 10*3/uL (ref 0.1–1.0)
Monocytes Relative: 10.7 % (ref 3.0–12.0)
Neutro Abs: 3.3 10*3/uL (ref 1.4–7.7)
Neutrophils Relative %: 59.2 % (ref 43.0–77.0)
Platelets: 219 10*3/uL (ref 150.0–400.0)
RBC: 4.43 Mil/uL (ref 4.22–5.81)
RDW: 14.2 % (ref 11.5–15.5)
WBC: 5.6 10*3/uL (ref 4.0–10.5)

## 2019-04-13 LAB — BASIC METABOLIC PANEL
BUN: 24 mg/dL — ABNORMAL HIGH (ref 6–23)
CO2: 27 mEq/L (ref 19–32)
Calcium: 9 mg/dL (ref 8.4–10.5)
Chloride: 106 mEq/L (ref 96–112)
Creatinine, Ser: 1.25 mg/dL (ref 0.40–1.50)
GFR: 55.22 mL/min — ABNORMAL LOW (ref >60.00)
Glucose, Bld: 100 mg/dL — ABNORMAL HIGH (ref 70–99)
Potassium: 4.5 mEq/L (ref 3.5–5.1)
Sodium: 140 mEq/L (ref 135–145)

## 2019-04-13 MED ORDER — SPIRIVA RESPIMAT 2.5 MCG/ACT IN AERS: 2.0000 | INHALATION_SPRAY | Freq: Every day | RESPIRATORY_TRACT | 0 refills | Status: DC

## 2019-04-13 NOTE — Patient Instructions (Addendum)
Very severe COPD START Spiriva 2.5 mcg TWO puffs ONCE a day CONTINUE Symbicort 160-4.5 mcg TWO puffs TWICE a day CONTINUE Proair AS NEEDED for shortness of breath and wheezing  Chronic hypoxemic respiratory failure CONTINUE supplemental oxygen to maintain levels >88% with activity and sleep We will recheck your ambulatory oxygen again at next visit to see if this improves after using your inhalers  Follow-up with me or NP in 3 months

## 2019-04-13 NOTE — Progress Notes (Signed)
Subjective:   PATIENT ID: Mike Mclaughlin GENDER: male DOB: 05-31-1934, MRN: [id number]   HPI  Chief Complaint  Patient presents with  . Consult    Patient is here for shortness of breath with exertion. Patient also has COPD. Saw Dr. Melvyn Novas in 2018.    Reason for Visit: Follow-up   Mike Mclaughlin 85 year old male with COPD and bradycardia who presents for follow-up.  He reports gradually worsening dyspnea in the last two years. He had a hip surgery and has been less mobile. Since the pandemic, he has not been out of the house. He reports that even taking out the trash to the end of the driveway can tire himself out and have shortness of breath associated with heavy breathing/gasping. Denies wheezing. He has chronic cough with mucous that is whitish. He drinks tea and coffee with improvement to his cough. His dyspnea improves with albuterol and rest. He is compliant with Symbicort twice a day and is using ProAir at least twice a day. Previously on Spiriva but did not feel it was effective. He wears oxygen at night. During the day he has readings around 92%. Denies seasonal allergies.   Social History: 1/2ppd x 20 years. Quit 2013.  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  I have personally reviewed patient's past medical/family/social history, allergies, current medications.  Past Medical History:  Diagnosis Date  . Arthritis   . Cardiac dysrhythmia, unspecified   . Dyslipidemia   . Esophageal reflux   . H/O sinus bradycardia   . Hypertension   . Insomnia, unspecified   . Mixed hyperlipidemia   . Rheumatism   . Skin cancer, basal cell      Family History  Problem Relation Age of Onset  . Cancer Mother   . Rheumatologic disease Mother   . Other Father        brain tumor  . Stroke Father   . Heart disease Sister      Social History   Occupational History  . Not on file  Tobacco Use  . Smoking status: Former Smoker     Packs/day: 0.50    Years: 15.00    Pack years: 7.50    Types: Cigarettes    Quit date: 08/29/2013    Years since quitting: 5.6  . Smokeless tobacco: Never Used  Substance and Sexual Activity  . Alcohol use: No  . Drug use: No  . Sexual activity: Not on file    No Known Allergies   Outpatient Medications Prior to Visit  Medication Sig Dispense Refill  . albuterol (PROVENTIL HFA;VENTOLIN HFA) 108 (90 Base) MCG/ACT inhaler Inhaler 2 puffs every 4 hours as needed for shortness of breath 1 Inhaler 2  . aspirin 81 MG tablet Take 81 mg by mouth daily.    . budesonide-formoterol (SYMBICORT) 160-4.5 MCG/ACT inhaler Inhale 2 puffs into the lungs 2 (two) times daily. 1 Inhaler 11  . omeprazole (PRILOSEC) 20 MG capsule Take 20 mg by mouth daily.    . OXYGEN 2.5 lpm with sleep    . Tamsulosin HCl (FLOMAX) 0.4 MG CAPS Take 0.4 mg by mouth daily.      No facility-administered medications prior to visit.    Review of Systems  Constitutional: Negative for chills, diaphoresis, fever, malaise/fatigue and weight loss.  HENT: Negative for congestion.   Respiratory: Positive for cough, sputum production and shortness of breath. Negative for hemoptysis and wheezing.   Cardiovascular: Negative for chest pain, palpitations  and leg swelling.     Objective:   Vitals:   04/13/19 1349  BP: (!) 150/70  Pulse: 61  Temp: (!) 97.3 F (36.3 C)  TempSrc: Temporal  SpO2: 92%  Weight: 199 lb 9.6 oz (90.5 kg)  Height: 5\' 10"  (1.778 m)   SpO2: 92 %(on RA) O2 Device: None (Room air)  Physical Exam: General: Well-appearing, no acute distress HENT: [place], AT Eyes: EOMI, no scleral icterus Respiratory: Diminished breath sounds bilaterally.  No crackles, wheezing or rales Cardiovascular: RRR, -M/R/G, no JVD GI: BS+, soft, nontender Extremities:-Edema,-tenderness Neuro: AAO x4, CNII-XII grossly intact Skin: Intact, no rashes or bruising Psych: Normal mood, normal affect  Data  Reviewed:  Imaging: CT Chest 10/23/15 (report only) - Centrilobular emphysema. 71mm lingular nodule improved compared to prior imaging  PFT: 11/14/15 FVC 2.0 (48%) FEV1 1.0 (33%) Ratio 49  Interpretation: Very severe obstructive defect present  Labs: CBC    Component Value Date/Time   WBC 5.6 04/13/2019 1502   RBC 4.43 04/13/2019 1502   HGB 11.8 (L) 04/13/2019 1502   HCT 37.8 (L) 04/13/2019 1502   PLT 219.0 04/13/2019 1502   MCV 85.3 04/13/2019 1502   MCHC 31.4 04/13/2019 1502   RDW 14.2 04/13/2019 1502   LYMPHSABS 1.4 04/13/2019 1502   MONOABS 0.6 04/13/2019 1502   EOSABS 0.2 04/13/2019 1502   BASOSABS 0.1 04/13/2019 1502   BMET    Component Value Date/Time   NA 140 04/13/2019 1502   K 4.5 04/13/2019 1502   CL 106 04/13/2019 1502   CO2 27 04/13/2019 1502   GLUCOSE 100 (H) 04/13/2019 1502   BUN 24 (H) 04/13/2019 1502   CREATININE 1.25 04/13/2019 1502   CALCIUM 9.0 04/13/2019 1502   Ambulatory O2 Patient Saturations on Room Air at Rest = 97%  Patient Saturations on Room Air while Ambulating = 88%  Patient Saturations on 2 Liters of oxygen while Ambulating = 96%  Imaging, labs and test noted above have been reviewed independently by me.    Assessment & Plan:   Discussion: 85 year old male with very severe COPD and chronic hypoxemic respiratory failure who presents for follow-up. We reviewed PFTs and performed ambulatory O2. I counseled on clinical course of COPD and importance of compliance with bronchodilator and oxygen therapy.  Very severe COPD START Spiriva 2.5 mcg TWO puffs ONCE a day CONTINUE Symbicort 160-4.5 mcg TWO puffs TWICE a day CONTINUE Proair AS NEEDED for shortness of breath and wheezing  Chronic hypoxemic respiratory failure CONTINUE supplemental oxygen to maintain levels >88% with activity and sleep We will recheck your ambulatory oxygen again at next visit to see if this improves after using your inhalers  Follow-up with me or NP in 3  months  Health Maintenance Immunization History  Administered Date(s) Administered  . DTaP 06/30/2011  . Influenza Split 01/01/2015  . Influenza, High Dose Seasonal PF 01/09/2016, 01/12/2018, 01/12/2018  . Influenza-Unspecified 12/22/2012, 12/22/2013, 12/22/2017  . Pneumococcal Conjugate-13 09/28/2013  . Pneumococcal Polysaccharide-23 05/30/2010  Planning for COVID-19 vaccine this week  CT Lung Screen - not qualified  Orders Placed This Encounter  Procedures  . CBC w/Diff    Standing Status:   Future    Standing Expiration Date:   04/12/2020  . Basic Metabolic Panel (BMET)    Standing Status:   Future    Standing Expiration Date:   04/12/2020  No orders of the defined types were placed in this encounter.   Return in about 3 months (around 07/12/2019).  I have spent a total time of 45-minutes on the day of the appointment reviewing prior documentation, coordinating care and discussing medical diagnosis and plan with the patient/family. Imaging, labs and tests included in this note have been reviewed and interpreted independently by me.  [place], MD [facility] 04/13/2019 1:55 PM  Office Number [phone]

## 2019-05-03 ENCOUNTER — Telehealth: Payer: Self-pay | Admitting: Pulmonary Disease

## 2019-05-03 MED ORDER — SPIRIVA RESPIMAT 2.5 MCG/ACT IN AERS: 2.0000 | INHALATION_SPRAY | Freq: Every day | RESPIRATORY_TRACT | 3 refills | Status: DC

## 2019-05-03 NOTE — Telephone Encounter (Signed)
Patient is returning phone call.  Patient phone number is [phone].

## 2019-05-03 NOTE — Telephone Encounter (Signed)
I called and spoke with the patient and he states that he is doing well on the Spiriva since using the samples. I have sent in a prescription to his pharmacy.

## 2019-05-03 NOTE — Telephone Encounter (Signed)
ATC pt, no answer. Left message for pt to call back.  

## 2019-07-12 ENCOUNTER — Ambulatory Visit (INDEPENDENT_AMBULATORY_CARE_PROVIDER_SITE_OTHER): Payer: Medicare Other

## 2019-07-12 ENCOUNTER — Other Ambulatory Visit: Payer: Self-pay

## 2019-07-12 ENCOUNTER — Ambulatory Visit (INDEPENDENT_AMBULATORY_CARE_PROVIDER_SITE_OTHER): Payer: Medicare Other | Admitting: Pulmonary Disease

## 2019-07-12 ENCOUNTER — Encounter: Payer: Self-pay | Admitting: Pulmonary Disease

## 2019-07-12 VITALS — BP 140/80 | HR 53 | Temp 98.3°F | Ht 70.0 in | Wt 201.2 lb

## 2019-07-12 DIAGNOSIS — J9611 Chronic respiratory failure with hypoxia: Secondary | ICD-10-CM

## 2019-07-12 DIAGNOSIS — J449 Chronic obstructive pulmonary disease, unspecified: Secondary | ICD-10-CM

## 2019-07-12 DIAGNOSIS — R9389 Abnormal findings on diagnostic imaging of other specified body structures: Secondary | ICD-10-CM

## 2019-07-12 DIAGNOSIS — J439 Emphysema, unspecified: Secondary | ICD-10-CM | POA: Diagnosis not present

## 2019-07-12 DIAGNOSIS — R5381 Other malaise: Secondary | ICD-10-CM

## 2019-07-12 HISTORY — DX: Other malaise: R53.81

## 2019-07-12 NOTE — Progress Notes (Signed)
@Patient  ID: Mike Mclaughlin, male    DOB: 10/26/1934, 85 y.o.   MRN: [id number]  Chief Complaint  Patient presents with  . Follow-up    f/u COPD    Referring provider: Elenore Paddy, NP  HPI:  85 year old male former smoker followed in our office for COPD  PMH: GERD, dyslipidemia, hypertension Smoker/ Smoking History: Former smoker.  Quit 2015 Maintenance: Symbicort 160, Spiriva Respimat 2.5 Pt of: Dr. Loanne Drilling  07/12/2019  - Visit   85 year old male former smoker followed in our office for COPD.  He was last seen by Dr. Loanne Drilling in January/2021 to establish care.  Spiriva Respimat 2.5 was started at that time.  Patient is presenting today as a 60-month follow-up.  Patient presenting to our office today for 54-month follow-up.  Patient reporting that his shortness of breath is no better.  Wife is present with patient today and she is very concerned regarding shortness of breath.  Of note patient was instructed at last office visit to be on oxygen with physical exertion.  He presented today on room air.  Wife reporting that he has been struggling with doing his ADLs as well as IADLs.  Oxygen levels are dropping to the low 80s.  Walk today in office reveals patient does still require oxygen with physical exertion.  We will discuss this today  Patient reports adherence to Symbicort 160 as well as Spiriva Respimat 2.5.  Questionaires / Pulmonary Flowsheets:   MMRC: mMRC Dyspnea Scale mMRC Score  07/12/2019 3    Tests:   04/13/2019-CBC with differential-eosinophils relative 4.3, eosinophils absolute 0.2  10/23/2015-CT chest without contrast-centrilobular emphysema noted bilaterally, slight decrease in lingular nodule now measuring 4 mm.  Given 2 years of stability on imaging follow-up no further imaging is needed.  Emphysema and bronchial wall thickening and scattered areas of small airway impaction in lower lobes, coronary artery and aortic atherosclerosis  11/14/2015-spirometry-FVC 2  (48% predicted), ratio 49, FEV1 1 (33% predicted  FENO:  No results found for: NITRICOXIDE  PFT: No flowsheet data found.  WALK:  SIX MIN WALK 07/12/2019 04/13/2019 04/28/2016 11/29/2013  Supplimental Oxygen during Test? (L/min) - Yes No No  O2 Flow Rate - 2 - -  Type - Continuous - -  Tech Comments: patient was able to finish his walk . Patient was put on O2 2L/MIN when patient's o2 was at 83%, patient's o2 went to 88% and put patient on o2 3L/MIN,Patient's o2 99% went patient was put on 4l/MIn . Patient's o2 stayed at 100% o2 and pule was 68. Patient walked moderate pace. Dropped to 88% halfway through lap 2. Patient placed on 2L oxygen and came back up to 98%. Patient did another lap and maintained good O2 sats. normal pace/SOB//lmr Walk completed without difficult.  No c/o from pt during walk.  Lowest o2 sat during walk 91% RA.     Imaging: No results found.  Lab Results:  CBC    Component Value Date/Time   WBC 5.6 04/13/2019 1502   RBC 4.43 04/13/2019 1502   HGB 11.8 (L) 04/13/2019 1502   HCT 37.8 (L) 04/13/2019 1502   PLT 219.0 04/13/2019 1502   MCV 85.3 04/13/2019 1502   MCHC 31.4 04/13/2019 1502   RDW 14.2 04/13/2019 1502   LYMPHSABS 1.4 04/13/2019 1502   MONOABS 0.6 04/13/2019 1502   EOSABS 0.2 04/13/2019 1502   BASOSABS 0.1 04/13/2019 1502    BMET    Component Value Date/Time   NA 140 04/13/2019 1502  K 4.5 04/13/2019 1502   CL 106 04/13/2019 1502   CO2 27 04/13/2019 1502   GLUCOSE 100 (H) 04/13/2019 1502   BUN 24 (H) 04/13/2019 1502   CREATININE 1.25 04/13/2019 1502   CALCIUM 9.0 04/13/2019 1502    BNP No results found for: BNP  ProBNP No results found for: PROBNP  Specialty Problems      Pulmonary Problems   COPD GOLD III           Solitary pulmonary nodule    L lingula 45mm 10/2013.  Rescan 10/2014>>>no change in nodule,  Final CT 10/19/15 Tia Alert) > no change so no dedicated f/u rec       Chronic respiratory failure with hypoxia                  Allergies  Allergen Reactions  . Other Rash    Immunization History  Administered Date(s) Administered  . DTaP 06/30/2011  . Influenza Split 01/01/2015  . Influenza, High Dose Seasonal PF 01/09/2016, 01/12/2018, 01/12/2018  . Influenza-Unspecified 12/22/2012, 12/22/2013, 12/22/2017  . Pneumococcal Conjugate-13 09/28/2013  . Pneumococcal Polysaccharide-23 05/30/2010    Past Medical History:  Diagnosis Date  . Arthritis   . Cardiac dysrhythmia, unspecified   . Dyslipidemia   . Esophageal reflux   . H/O sinus bradycardia   . Hypertension   . Insomnia, unspecified   . Mixed hyperlipidemia   . Rheumatism   . Skin cancer, basal cell     Tobacco History: Social History   Tobacco Use  Smoking Status Former Smoker  . Packs/day: 0.50  . Years: 15.00  . Pack years: 7.50  . Types: Cigarettes  . Quit date: 08/29/2013  . Years since quitting: 5.8  Smokeless Tobacco Never Used   Counseling given: Not Answered   Continue to not smoke  Outpatient Encounter Medications as of 07/12/2019  Medication Sig  . albuterol (PROVENTIL HFA;VENTOLIN HFA) 108 (90 Base) MCG/ACT inhaler Inhaler 2 puffs every 4 hours as needed for shortness of breath  . aspirin 81 MG tablet Take 81 mg by mouth daily.  . budesonide-formoterol (SYMBICORT) 160-4.5 MCG/ACT inhaler Inhale 2 puffs into the lungs 2 (two) times daily.  Marland Kitchen omeprazole (PRILOSEC) 20 MG capsule Take 20 mg by mouth daily.  . OXYGEN 2.5 lpm with sleep  . Tamsulosin HCl (FLOMAX) 0.4 MG CAPS Take 0.4 mg by mouth daily.   . Tiotropium Bromide Monohydrate (SPIRIVA RESPIMAT) 2.5 MCG/ACT AERS Inhale 2 puffs into the lungs daily.   No facility-administered encounter medications on file as of 07/12/2019.     Review of Systems  Review of Systems  Constitutional: Positive for fatigue. Negative for activity change, chills, fever and unexpected weight change.  HENT: Negative for postnasal drip, rhinorrhea, sinus pressure, sinus pain and  sore throat.   Eyes: Negative.   Respiratory: Positive for shortness of breath. Negative for cough and wheezing.   Cardiovascular: Negative for chest pain and palpitations.  Gastrointestinal: Negative for constipation, diarrhea, nausea and vomiting.  Endocrine: Negative.   Genitourinary: Negative.   Musculoskeletal: Negative.   Skin: Negative.   Neurological: Negative for dizziness and headaches.  Psychiatric/Behavioral: Negative.  Negative for dysphoric mood. The patient is not nervous/anxious.   All other systems reviewed and are negative.    Physical Exam  BP 140/80 (BP Location: Left Arm, Patient Position: Sitting, Cuff Size: Normal)   Pulse (!) 53   Temp 98.3 F (36.8 C) (Temporal)   Ht 5\' 10"  (1.778 m)   Wt 201  lb 3.2 oz (91.3 kg)   SpO2 99%   BMI 28.87 kg/m   Wt Readings from Last 5 Encounters:  07/12/19 201 lb 3.2 oz (91.3 kg)  04/13/19 199 lb 9.6 oz (90.5 kg)  01/30/17 204 lb (92.5 kg)  05/26/16 204 lb 3.2 oz (92.6 kg)  04/28/16 208 lb (94.3 kg)    BMI Readings from Last 5 Encounters:  07/12/19 28.87 kg/m  04/13/19 28.64 kg/m  01/30/17 29.27 kg/m  05/26/16 29.30 kg/m  04/28/16 29.84 kg/m     Physical Exam Vitals and nursing note reviewed.  Constitutional:      General: He is not in acute distress.    Appearance: Normal appearance. He is obese.  HENT:     Head: Normocephalic and atraumatic.     Right Ear: Hearing, tympanic membrane, ear canal and external ear normal.     Left Ear: Hearing, tympanic membrane, ear canal and external ear normal.     Nose: Nose normal. No mucosal edema or rhinorrhea.     Right Turbinates: Not enlarged.     Left Turbinates: Not enlarged.     Mouth/Throat:     Mouth: Mucous membranes are dry.     Pharynx: Oropharynx is clear. No oropharyngeal exudate.  Eyes:     Pupils: Pupils are equal, round, and reactive to light.  Cardiovascular:     Rate and Rhythm: Normal rate and regular rhythm.     Pulses: Normal pulses.      Heart sounds: Normal heart sounds. No murmur.  Pulmonary:     Effort: Pulmonary effort is normal.     Breath sounds: No decreased breath sounds, wheezing or rales.     Comments: Diminished breath sounds in bases Abdominal:     General: Bowel sounds are normal. There is no distension.     Palpations: Abdomen is soft.     Tenderness: There is no abdominal tenderness.  Musculoskeletal:     Cervical back: Normal range of motion.     Right lower leg: No edema.     Left lower leg: No edema.  Lymphadenopathy:     Cervical: No cervical adenopathy.  Skin:    General: Skin is warm and dry.     Capillary Refill: Capillary refill takes less than 2 seconds.     Findings: No erythema or rash.  Neurological:     General: No focal deficit present.     Mental Status: He is alert and oriented to person, place, and time.     Motor: No weakness.     Coordination: Coordination normal.     Gait: Gait is intact. Gait normal.  Psychiatric:        Mood and Affect: Mood normal.        Behavior: Behavior normal. Behavior is cooperative.        Thought Content: Thought content normal.        Judgment: Judgment normal.       Assessment & Plan:   Chronic respiratory failure with hypoxia Patient presenting to office on room air Last walk 3 months ago showed patient needed exertional oxygen Patient is waiting for a portable oxygen concentrator Patient has tanks with her in the car Walk today in office patient had exertional hypoxemia and required 4 L of O2 for oxygen saturations to stabilize  Plan: Emphasized importance of the patient wearing oxygen Maintain oxygen saturations above 88% Follow-up in 3 months PFT ordered  COPD GOLD III 2015 spirometry shows severe obstruction and restriction  Plan: Continue Symbicort 160 Continue Spiriva Respimat 2.5 PFTs ordered Emphasized importance of wearing oxygen Chest x-ray today  Physical deconditioning Patient is deconditioned  Plan: Offered  home physical therapy referral, patient declined Patient will work on home exercises    Return in about 3 months (around 10/11/2019), or if symptoms worsen or fail to improve, for Follow up with Dr. Loanne Drilling.   Lauraine Rinne, NP 07/12/2019   This appointment required 33 minutes of patient care (this includes precharting, chart review, review of results, face-to-face care, etc.).

## 2019-07-12 NOTE — Patient Instructions (Addendum)
You were seen today by Lauraine Rinne, NP  for:   1. COPD GOLD III  - Pulmonary function test; Future - DG Chest 2 View; Future  Continue Symbicort 160 >>> 2 puffs in the morning right when you wake up, rinse out your mouth after use, 12 hours later 2 puffs, rinse after use >>> Take this daily, no matter what >>> This is not a rescue inhaler   Spiriva Respimat 2.5 >>> 2 puffs daily >>> Do this every day >>>This is not a rescue inhaler  Note your daily symptoms > remember "red flags" for COPD:   >>>Increase in cough >>>increase in sputum production >>>increase in shortness of breath or activity  intolerance.   If you notice these symptoms, please call the office to be seen.    2. Chronic respiratory failure with hypoxia/ noct hypoxemia s significant ex desat  - Pulmonary function test; Future  Continue oxygen therapy as prescribed  >>>maintain oxygen saturations greater than 88 percent  >>>if unable to maintain oxygen saturations please contact the office  >>>do not smoke with oxygen  >>>can use nasal saline gel or nasal saline rinses to moisturize nose if oxygen causes dryness  3. Physical deconditioning  I would recommend home physical therapy, you declined this at this time  Review exercises listed below   We recommend today:  Orders Placed This Encounter  Procedures  . DG Chest 2 View    Standing Status:   Future    Standing Expiration Date:   09/10/2020    Order Specific Question:   Reason for Exam (SYMPTOM  OR DIAGNOSIS REQUIRED)    Answer:   copd, former smoker    Order Specific Question:   Preferred imaging location?    Answer:   Internal    Order Specific Question:   Radiology Contrast Protocol - do NOT remove file path    Answer:   [email or web address]  . Pulmonary function test    Standing Status:   Future    Standing Expiration Date:   07/11/2020    Order Specific Question:   Where should this test be performed?   Answer:   [facility]   Orders Placed This Encounter  Procedures  . DG Chest 2 View  . Pulmonary function test   No orders of the defined types were placed in this encounter.   Follow Up:    Return in about 3 months (around 10/11/2019), or if symptoms worsen or fail to improve, for Follow up with Dr. Loanne Drilling.   Please do your part to reduce the spread of COVID-19:      Reduce your risk of any infection  and COVID19 by using the similar precautions used for avoiding the common cold or flu:  Marland Kitchen Wash your hands often with soap and warm water for at least 20 seconds.  If soap and water are not readily available, use an alcohol-based hand sanitizer with at least 60% alcohol.  . If coughing or sneezing, cover your mouth and nose by coughing or sneezing into the elbow areas of your shirt or coat, into a tissue or into your sleeve (not your hands). Langley Gauss A MASK when in public  . Avoid shaking hands with others and consider head nods or verbal greetings only. . Avoid touching your eyes, nose, or mouth with unwashed hands.  . Avoid close contact with people who are sick. . Avoid places or events with large numbers of people in one location, like concerts or sporting events. Marland Kitchen  If you have some symptoms but not all symptoms, continue to monitor at home and seek medical attention if your symptoms worsen. . If you are having a medical emergency, call 911.   [place] / e-Visit: [email or web address]         [facility]: [facility]: [id number]                   [facility]: [id number]     It is flu season:   >>> Best ways to protect herself from the flu: Receive the yearly flu vaccine, practice good hand hygiene washing with soap and also using hand sanitizer when available, eat a nutritious meals, get adequate rest, hydrate  appropriately   Please contact the office if your symptoms worsen or you have concerns that you are not improving.   Thank you for choosing [facility] for your healthcare, and for allowing [place] to partner with you on your healthcare journey. I am thankful to be able to provide care to you today.   Wyn Quaker FNP-C    COPD and Physical Activity Chronic obstructive pulmonary disease (COPD) is a long-term (chronic) condition that affects the lungs. COPD is a general term that can be used to describe many different lung problems that cause lung swelling (inflammation) and limit airflow, including chronic bronchitis and emphysema. The main symptom of COPD is shortness of breath, which makes it harder to do even simple tasks. This can also make it harder to exercise and be active. Talk with your health care provider about treatments to help you breathe better and actions you can take to prevent breathing problems during physical activity. What are the benefits of exercising with COPD? Exercising regularly is an important part of a healthy lifestyle. You can still exercise and do physical activities even though you have COPD. Exercise and physical activity improve your shortness of breath by increasing blood flow (circulation). This causes your heart to pump more oxygen through your body. Moderate exercise can improve your:  Oxygen use.  Energy level.  Shortness of breath.  Strength in your breathing muscles.  Heart health.  Sleep.  Self-esteem and feelings of self-worth.  Depression, stress, and anxiety levels. Exercise can benefit everyone with COPD. The severity of your disease may affect how hard you can exercise, especially at first, but everyone can benefit. Talk with your health care provider about how much exercise is safe for you, and which activities and exercises are safe for you. What actions can I take to prevent breathing problems during physical activity?  Sign up  for a pulmonary rehabilitation program. This type of program may include: ? Education about lung diseases. ? Exercise classes that teach you how to exercise and be more active while improving your breathing. This usually involves:  Exercise using your lower extremities, such as a stationary bicycle.  About 30 minutes of exercise, 2 to 5 times per week, for 6 to 12 weeks  Strength training, such as push ups or leg lifts. ? Nutrition education. ? Group classes in which you can talk with others who also have COPD and learn ways to manage stress.  If you use an oxygen tank, you should use it while you exercise. Work with your health care provider to adjust your oxygen for your physical activity. Your resting flow rate is different from your flow rate during physical activity.  While you are exercising: ?  Take slow breaths. ? Pace yourself and do not try to go too fast. ? Purse your lips while breathing out. Pursing your lips is similar to a kissing or whistling position. ? If doing exercise that uses a quick burst of effort, such as weight lifting:  Breathe in before starting the exercise.  Breathe out during the hardest part of the exercise (such as raising the weights). Where to find support You can find support for exercising with COPD from:  Your health care provider.  A pulmonary rehabilitation program.  Your local health department or community health programs.  Support groups, online or in-person. Your health care provider may be able to recommend support groups. Where to find more information You can find more information about exercising with COPD from:  American Lung Association: ClassInsider.se.  COPD Foundation: https://www.rivera.net/. Contact a health care provider if:  Your symptoms get worse.  You have chest pain.  You have nausea.  You have a fever.  You have trouble talking or catching your breath.  You want to start a new exercise program or a new  activity. Summary  COPD is a general term that can be used to describe many different lung problems that cause lung swelling (inflammation) and limit airflow. This includes chronic bronchitis and emphysema.  Exercise and physical activity improve your shortness of breath by increasing blood flow (circulation). This causes your heart to provide more oxygen to your body.  Contact your health care provider before starting any exercise program or new activity. Ask your health care provider what exercises and activities are safe for you. This information is not intended to replace advice given to you by your health care provider. Make sure you discuss any questions you have with your health care provider. Document Revised: 06/30/2018 Document Reviewed: 04/02/2017 Elsevier Patient Education  2020 [employer].

## 2019-07-12 NOTE — Assessment & Plan Note (Signed)
2015 spirometry shows severe obstruction and restriction  Plan: Continue Symbicort 160 Continue Spiriva Respimat 2.5 PFTs ordered Emphasized importance of wearing oxygen Chest x-ray today

## 2019-07-12 NOTE — Assessment & Plan Note (Signed)
Patient presenting to office on room air Last walk 3 months ago showed patient needed exertional oxygen Patient is waiting for a portable oxygen concentrator Patient has tanks with her in the car Walk today in office patient had exertional hypoxemia and required 4 L of O2 for oxygen saturations to stabilize  Plan: Emphasized importance of the patient wearing oxygen Maintain oxygen saturations above 88% Follow-up in 3 months PFT ordered

## 2019-07-12 NOTE — Assessment & Plan Note (Signed)
Patient is deconditioned  Plan: Offered home physical therapy referral, patient declined Patient will work on home exercises

## 2019-07-22 ENCOUNTER — Telehealth: Payer: Self-pay | Admitting: Pulmonary Disease

## 2019-07-22 NOTE — Telephone Encounter (Signed)
I called Sonya with [employer] and ask her to refax the CMN

## 2019-07-22 NOTE — Telephone Encounter (Signed)
Mike Mclaughlin, please advise if you received a form from [facility] that was needing to be filled out for pt to have O2 renewed.

## 2019-07-25 NOTE — Telephone Encounter (Signed)
Please advise if this was received, thanks

## 2019-07-25 NOTE — Telephone Encounter (Signed)
I still haven't received the CMN for this patient yet. I called and LVM for Mike Mclaughlin to refax to my fax # [phone].

## 2019-07-26 ENCOUNTER — Other Ambulatory Visit (HOSPITAL_COMMUNITY)
Admission: RE | Admit: 2019-07-26 | Discharge: 2019-07-26 | Disposition: A | Discharge status: Home / Self Care | Payer: Medicare Other | Source: Ambulatory Visit | Attending: Pulmonary Disease | Admitting: Pulmonary Disease

## 2019-07-26 DIAGNOSIS — Z01812 Encounter for preprocedural laboratory examination: Secondary | ICD-10-CM | POA: Insufficient documentation

## 2019-07-26 DIAGNOSIS — Z20822 Contact with and (suspected) exposure to covid-19: Secondary | ICD-10-CM | POA: Insufficient documentation

## 2019-07-26 LAB — SARS CORONAVIRUS 2 (TAT 6-24 HRS): SARS Coronavirus 2: NEGATIVE

## 2019-07-27 NOTE — Telephone Encounter (Signed)
I called again today trying to get the CMN faxed to my fax # [phone]. I had to LVM for Sonya with [employer]

## 2019-07-28 NOTE — Telephone Encounter (Signed)
Mike Mclaughlin from [facility] sent me a message through Epic asking about whether I got the CMN or not. I just called her and we got it all worked out. She faxed the CMN to my fax # [phone] and also was emailing the CMN to me. Aaron Edelman has signed the CMN and it has been faxed back to [facility] and I received confirmation that the fax was received

## 2019-07-29 ENCOUNTER — Other Ambulatory Visit: Payer: Self-pay

## 2019-07-29 ENCOUNTER — Ambulatory Visit (INDEPENDENT_AMBULATORY_CARE_PROVIDER_SITE_OTHER): Payer: Medicare Other | Admitting: Pulmonary Disease

## 2019-07-29 DIAGNOSIS — J449 Chronic obstructive pulmonary disease, unspecified: Secondary | ICD-10-CM

## 2019-07-29 DIAGNOSIS — J9611 Chronic respiratory failure with hypoxia: Secondary | ICD-10-CM

## 2019-07-29 LAB — PULMONARY FUNCTION TEST
DL/VA % pred: 64 %
DL/VA: 2.48 ml/min/mmHg/L
DLCO cor % pred: 47 %
DLCO cor: 11.38 ml/min/mmHg
DLCO unc % pred: 47 %
DLCO unc: 11.38 ml/min/mmHg
FEF 25-75 Post: 0.73 L/sec
FEF 25-75 Pre: 0.41 L/sec
FEF2575-%Change-Post: 77 %
FEF2575-%Pred-Post: 38 %
FEF2575-%Pred-Pre: 21 %
FEV1-%Change-Post: 22 %
FEV1-%Pred-Post: 48 %
FEV1-%Pred-Pre: 39 %
FEV1-Post: 1.34 L
FEV1-Pre: 1.09 L
FEV1FVC-%Change-Post: 3 %
FEV1FVC-%Pred-Pre: 67 %
FEV6-%Change-Post: 19 %
FEV6-%Pred-Post: 70 %
FEV6-%Pred-Pre: 58 %
FEV6-Post: 2.6 L
FEV6-Pre: 2.16 L
FEV6FVC-%Change-Post: 0 %
FEV6FVC-%Pred-Post: 102 %
FEV6FVC-%Pred-Pre: 102 %
FVC-%Change-Post: 18 %
FVC-%Pred-Post: 68 %
FVC-%Pred-Pre: 57 %
FVC-Post: 2.71 L
FVC-Pre: 2.28 L
Post FEV1/FVC ratio: 50 %
Post FEV6/FVC ratio: 96 %
Pre FEV1/FVC ratio: 48 %
Pre FEV6/FVC Ratio: 95 %
RV % pred: 163 %
RV: 4.42 L
TLC % pred: 103 %
TLC: 7.34 L

## 2019-07-29 NOTE — Progress Notes (Signed)
PFT done today. 

## 2019-08-01 ENCOUNTER — Ambulatory Visit
Admission: RE | Admit: 2019-08-01 | Discharge: 2019-08-01 | Disposition: A | Discharge status: Home / Self Care | Payer: Medicare Other | Source: Ambulatory Visit | Attending: Pulmonary Disease | Admitting: Pulmonary Disease

## 2019-08-01 DIAGNOSIS — R9389 Abnormal findings on diagnostic imaging of other specified body structures: Secondary | ICD-10-CM

## 2019-08-01 DIAGNOSIS — J9811 Atelectasis: Secondary | ICD-10-CM | POA: Diagnosis not present

## 2019-09-03 ENCOUNTER — Other Ambulatory Visit: Payer: Self-pay | Admitting: Pulmonary Disease

## 2019-10-03 ENCOUNTER — Ambulatory Visit (INDEPENDENT_AMBULATORY_CARE_PROVIDER_SITE_OTHER): Payer: Medicare Other

## 2019-10-03 ENCOUNTER — Other Ambulatory Visit: Payer: Self-pay

## 2019-10-03 ENCOUNTER — Encounter: Payer: Self-pay | Admitting: Cardiology

## 2019-10-03 ENCOUNTER — Ambulatory Visit (INDEPENDENT_AMBULATORY_CARE_PROVIDER_SITE_OTHER): Payer: Medicare Other | Admitting: Cardiology

## 2019-10-03 VITALS — BP 154/76 | HR 65 | Ht 70.0 in | Wt 201.0 lb

## 2019-10-03 DIAGNOSIS — I272 Pulmonary hypertension, unspecified: Secondary | ICD-10-CM

## 2019-10-03 DIAGNOSIS — R0602 Shortness of breath: Secondary | ICD-10-CM

## 2019-10-03 DIAGNOSIS — R001 Bradycardia, unspecified: Secondary | ICD-10-CM

## 2019-10-03 NOTE — Patient Instructions (Signed)
Medication Instructions:  No medication changes. *If you need a refill on your cardiac medications before your next appointment, please call your pharmacy*   Lab Work: None ordered If you have labs (blood work) drawn today and your tests are completely normal, you will receive your results only by: Marland Kitchen MyChart Message (if you have MyChart) OR . A paper copy in the mail If you have any lab test that is abnormal or we need to change your treatment, we will call you to review the results.   Testing/Procedures: Your physician has requested that you have an echocardiogram. Echocardiography is a painless test that uses sound waves to create images of your heart. It provides your doctor with information about the size and shape of your heart and how well your heart's chambers and valves are working. This procedure takes approximately one hour. There are no restrictions for this procedure.   WHY IS MY DOCTOR PRESCRIBING ZIO? The Zio system is proven and trusted by physicians to detect and diagnose irregular heart rhythms -- and has been prescribed to hundreds of thousands of patients.  The FDA has cleared the Zio system to monitor for many different kinds of irregular heart rhythms. In a study, physicians were able to reach a diagnosis 90% of the time with the Zio system1.  You can wear the Zio monitor -- a small, discreet, comfortable patch -- during your normal day-to-day activity, including while you sleep, shower, and exercise, while it records every single heartbeat for analysis.  1Barrett, P., et al. Comparison of 24 Hour Holter Monitoring Versus 14 Day Novel Adhesive Patch Electrocardiographic Monitoring. [employer], 2014.  ZIO VS. HOLTER MONITORING The Zio monitor can be comfortably worn for up to 14 days. Holter monitors can be worn for 24 to 48 hours, limiting the time to record any irregular heart rhythms you may have. Zio is able to capture data for the 51% of patients  who have their first symptom-triggered arrhythmia after 48 hours.1  LIVE WITHOUT RESTRICTIONS The Zio ambulatory cardiac monitor is a small, unobtrusive, and water-resistant patch--you might even forget you're wearing it. The Zio monitor records and stores every beat of your heart, whether you're sleeping, working out, or showering.  Wear for 1 week, remove 10/10/19.   Follow-Up: At [facility], you and your health needs are our priority.  As part of our continuing mission to provide you with exceptional heart care, we have created designated Provider Care Teams.  These Care Teams include your primary Cardiologist (physician) and Advanced Practice Providers (APPs -  Physician Assistants and Nurse Practitioners) who all work together to provide you with the care you need, when you need it.  We recommend signing up for the patient portal called "MyChart".  Sign up information is provided on this After Visit Summary.  MyChart is used to connect with patients for Virtual Visits (Telemedicine).  Patients are able to view lab/test results, encounter notes, upcoming appointments, etc.  Non-urgent messages can be sent to your provider as well.   To learn more about what you can do with MyChart, go to [email or web address].    Your next appointment:   3 month(s)  The format for your next appointment:   In Person  Provider:   Berniece Salines, DO   Other Instructions  Echocardiogram An echocardiogram is a procedure that uses painless sound waves (ultrasound) to produce an image of the heart. Images from an echocardiogram can provide important information about:  Signs of coronary artery  disease (CAD).  Aneurysm detection. An aneurysm is a weak or damaged part of an artery wall that bulges out from the normal force of blood pumping through the body.  Heart size and shape. Changes in the size or shape of the heart can be associated with certain conditions, including heart failure, aneurysm, and  CAD.  Heart muscle function.  Heart valve function.  Signs of a past heart attack.  Fluid buildup around the heart.  Thickening of the heart muscle.  A tumor or infectious growth around the heart valves. Tell a health care provider about:  Any allergies you have.  All medicines you are taking, including vitamins, herbs, eye drops, creams, and over-the-counter medicines.  Any blood disorders you have.  Any surgeries you have had.  Any medical conditions you have.  Whether you are pregnant or may be pregnant. What are the risks? Generally, this is a safe procedure. However, problems may occur, including:  Allergic reaction to dye (contrast) that may be used during the procedure. What happens before the procedure? No specific preparation is needed. You may eat and drink normally. What happens during the procedure?   An IV tube may be inserted into one of your veins.  You may receive contrast through this tube. A contrast is an injection that improves the quality of the pictures from your heart.  A gel will be applied to your chest.  A wand-like tool (transducer) will be moved over your chest. The gel will help to transmit the sound waves from the transducer.  The sound waves will harmlessly bounce off of your heart to allow the heart images to be captured in real-time motion. The images will be recorded on a computer. The procedure may vary among health care providers and hospitals. What happens after the procedure?  You may return to your normal, everyday life, including diet, activities, and medicines, unless your health care provider tells you not to do that. Summary  An echocardiogram is a procedure that uses painless sound waves (ultrasound) to produce an image of the heart.  Images from an echocardiogram can provide important information about the size and shape of your heart, heart muscle function, heart valve function, and fluid buildup around your  heart.  You do not need to do anything to prepare before this procedure. You may eat and drink normally.  After the echocardiogram is completed, you may return to your normal, everyday life, unless your health care provider tells you not to do that. This information is not intended to replace advice given to you by your health care provider. Make sure you discuss any questions you have with your health care provider. Document Revised: 07/01/2018 Document Reviewed: 04/12/2016 Elsevier Patient Education  [place].

## 2019-10-03 NOTE — Progress Notes (Addendum)
Cardiology Office Note:    Date:  10/03/2019   ID:  Mike Mclaughlin, DOB [birth year], MRN [id number]  PCP:  Elenore Paddy, NP  Cardiologist:  No primary care provider on file.  Electrophysiologist:  None   Referring MD: Elenore Paddy, NP   " I am short of breath and have history of slow heart rate"   History of Present Illness:    Mike Mclaughlin is a 85 y.o. male with a hx of COPD, respiratory failure, chronic persistent bradycardia, hypertension, hyperlipidemia and pulmonary hypertension was referred by his PCP.  The patient tells me that he had been diagnosed persistent bradycardia for very long time and in the past he wore a monitor at which time he was told that he probably needed a pacemaker but due to surgery he was planning he deferred this.  He did not follow-up with cardiology since that time.  His PCP is concerned and at this time he had been asked to be reevaluated.  He denies any chest pain.  But admits to shortness of breath at baseline.  Past Medical History:  Diagnosis Date  . Arthritis   . Cardiac dysrhythmia, unspecified   . Dyslipidemia   . Esophageal reflux   . H/O sinus bradycardia   . Hypertension   . Insomnia, unspecified   . Mixed hyperlipidemia   . Rheumatism   . Skin cancer, basal cell     Past Surgical History:  Procedure Laterality Date  . CATARACT EXTRACTION    . ESOPHAGOGASTRODUODENOSCOPY N/A 01/30/2017   Procedure: ESOPHAGOGASTRODUODENOSCOPY (EGD);  Surgeon: Carol Ada, MD;  Location: [facility];  Service: Endoscopy;  Laterality: N/A;  . hemmorrhoid surgery      Current Medications: Current Meds  Medication Sig  . albuterol (PROVENTIL HFA;VENTOLIN HFA) 108 (90 Base) MCG/ACT inhaler Inhaler 2 puffs every 4 hours as needed for shortness of breath  . aspirin 81 MG tablet Take 81 mg by mouth daily.  . budesonide-formoterol (SYMBICORT) 160-4.5 MCG/ACT inhaler Inhale 2 puffs into the lungs 2 (two) times daily.  Marland Kitchen omeprazole (PRILOSEC) 20 MG  capsule Take 20 mg by mouth daily.  . OXYGEN 2.5 lpm with sleep  . SPIRIVA RESPIMAT 2.5 MCG/ACT AERS INHALE 2 PUFFS BY MOUTH INTO THE LUNGS DAILY  . Tamsulosin HCl (FLOMAX) 0.4 MG CAPS Take 0.4 mg by mouth daily.      Allergies:   Atorvastatin calcium [atorvastatin], Chantix [varenicline], and Other   Social History   Socioeconomic History  . Marital status: Married    Spouse name: Not on file  . Number of children: Not on file  . Years of education: Not on file  . Highest education level: Not on file  Occupational History  . Not on file  Tobacco Use  . Smoking status: Former Smoker    Packs/day: 0.50    Years: 15.00    Pack years: 7.50    Types: Cigarettes    Quit date: 08/29/2013    Years since quitting: 6.0  . Smokeless tobacco: Never Used  Vaping Use  . Vaping Use: Never used  Substance and Sexual Activity  . Alcohol use: No  . Drug use: No  . Sexual activity: Not on file  Other Topics Concern  . Not on file  Social History Narrative   Married lives with wife.   Retired   Scientist, physiological Strain:   . Difficulty of Paying Living Expenses:   Food Insecurity:   . Worried About  Running Out of Food in the Last Year:   . [place] in the Last Year:   Transportation Needs:   . Lack of Transportation (Medical):   Marland Kitchen Lack of Transportation (Non-Medical):   Physical Activity:   . Days of Exercise per Week:   . Minutes of Exercise per Session:   Stress:   . Feeling of Stress :   Social Connections:   . Frequency of Communication with Friends and Family:   . Frequency of Social Gatherings with Friends and Family:   . Attends Religious Services:   . Active Member of Clubs or Organizations:   . Attends Archivist Meetings:   Marland Kitchen Marital Status:      Family History: The patient's family history includes Cancer in his mother; Heart disease in his sister; Other in his father; Rheumatologic disease in his mother; Stroke in  his father.  ROS:   Review of Systems  Constitution: Negative for decreased appetite, fever and weight gain.  HENT: Negative for congestion, ear discharge, hoarse voice and sore throat.   Eyes: Negative for discharge, redness, vision loss in right eye and visual halos.  Cardiovascular: Negative for chest pain, dyspnea on exertion, leg swelling, orthopnea and palpitations.  Respiratory: Reports shortness of breath.  Negative for cough, hemoptysis, and snoring.   Endocrine: Negative for heat intolerance and polyphagia.  Hematologic/Lymphatic: Negative for bleeding problem. Does not bruise/bleed easily.  Skin: Negative for flushing, nail changes, rash and suspicious lesions.  Musculoskeletal: Negative for arthritis, joint pain, muscle cramps, myalgias, neck pain and stiffness.  Gastrointestinal: Negative for abdominal pain, bowel incontinence, diarrhea and excessive appetite.  Genitourinary: Negative for decreased libido, genital sores and incomplete emptying.  Neurological: Negative for brief paralysis, focal weakness, headaches and loss of balance.  Psychiatric/Behavioral: Negative for altered mental status, depression and suicidal ideas.  Allergic/Immunologic: Negative for HIV exposure and persistent infections.    EKGs/Labs/Other Studies Reviewed:    The following studies were reviewed today:   EKG: Sinus bradycardia, heart rate 54 bpm, no prior EKG for comparison  Recent Labs: 04/13/2019: BUN 24; Creatinine, Ser 1.25; Hemoglobin 11.8; Platelets 219.0; Potassium 4.5; Sodium 140  Recent Lipid Panel No results found for: CHOL, TRIG, HDL, CHOLHDL, VLDL, LDLCALC, LDLDIRECT  Physical Exam:    VS:  BP (!) 154/76 (BP Location: Left Arm, Patient Position: Sitting, Cuff Size: Normal)   Pulse 65   Ht 5\' 10"  (1.778 m)   Wt 201 lb (91.2 kg)   SpO2 97%   BMI 28.84 kg/m     Wt Readings from Last 3 Encounters:  10/03/19 201 lb (91.2 kg)  07/12/19 201 lb 3.2 oz (91.3 kg)  04/13/19 199 lb  9.6 oz (90.5 kg)     GEN: Well nourished, well developed in no acute distress HEENT: Normal NECK: No JVD; No carotid bruits LYMPHATICS: No lymphadenopathy CARDIAC: S1S2 noted,RRR, no murmurs, rubs, gallops RESPIRATORY:  Clear to auscultation without rales, wheezing or rhonchi  ABDOMEN: Soft, non-tender, non-distended, +bowel sounds, no guarding. EXTREMITIES: No edema, No cyanosis, no clubbing MUSCULOSKELETAL:  No deformity  SKIN: Warm and dry NEUROLOGIC:  Alert and oriented x 3, non-focal PSYCHIATRIC:  Normal affect, good insight  ASSESSMENT:    1. Sinus bradycardia   2. Pulmonary hypertension, unspecified ([place])   3. Shortness of breath    PLAN:     1.  He is bradycardic in the office here today.  There has been issue for chronic bradycardia and will plan for pacemaker  placement.  At this time I like to place a monitor on the patient for 7 days to understand his heart rate and rhythm as well.  His shortness of breath could be multifactorial including his chronic respiratory failure.  Due to history of pulmonary hypertension and chronic systemic hypertension I will go ahead and place an echocardiogram to assess LV/RV function as well as for any valvular abnormalities.  Hyperlipidemia continue with current management.  His LDL is 112, HDL 41, total cholesterol 165 and triglyceride 60 on September 19, 2019.  He had a reaction to atorvastatin and prefers not to take a statin medication at this time.  The patient is in agreement with the above plan. The patient left the office in stable condition.  The patient will follow up in 3 months or sooner if needed  Medication Adjustments/Labs and Tests Ordered: Current medicines are reviewed at length with the patient today.  Concerns regarding medicines are outlined above.  Orders Placed This Encounter  Procedures  . LONG TERM MONITOR (3-14 DAYS)  . EKG 12-Lead  . ECHOCARDIOGRAM COMPLETE   No orders of the defined types were placed in this  encounter.   Patient Instructions  Medication Instructions:  No medication changes. *If you need a refill on your cardiac medications before your next appointment, please call your pharmacy*   Lab Work: None ordered If you have labs (blood work) drawn today and your tests are completely normal, you will receive your results only by: Marland Kitchen MyChart Message (if you have MyChart) OR . A paper copy in the mail If you have any lab test that is abnormal or we need to change your treatment, we will call you to review the results.   Testing/Procedures: Your physician has requested that you have an echocardiogram. Echocardiography is a painless test that uses sound waves to create images of your heart. It provides your doctor with information about the size and shape of your heart and how well your heart's chambers and valves are working. This procedure takes approximately one hour. There are no restrictions for this procedure.   WHY IS MY DOCTOR PRESCRIBING ZIO? The Zio system is proven and trusted by physicians to detect and diagnose irregular heart rhythms -- and has been prescribed to hundreds of thousands of patients.  The FDA has cleared the Zio system to monitor for many different kinds of irregular heart rhythms. In a study, physicians were able to reach a diagnosis 90% of the time with the Zio system1.  You can wear the Zio monitor -- a small, discreet, comfortable patch -- during your normal day-to-day activity, including while you sleep, shower, and exercise, while it records every single heartbeat for analysis.  1Barrett, P., et al. Comparison of 24 Hour Holter Monitoring Versus 14 Day Novel Adhesive Patch Electrocardiographic Monitoring. [place], 2014.  ZIO VS. HOLTER MONITORING The Zio monitor can be comfortably worn for up to 14 days. Holter monitors can be worn for 24 to 48 hours, limiting the time to record any irregular heart rhythms you may have. Zio is able  to capture data for the 51% of patients who have their first symptom-triggered arrhythmia after 48 hours.1  LIVE WITHOUT RESTRICTIONS The Zio ambulatory cardiac monitor is a small, unobtrusive, and water-resistant patch--you might even forget you're wearing it. The Zio monitor records and stores every beat of your heart, whether you're sleeping, working out, or showering.  Wear for 1 week, remove 10/10/19.   Follow-Up: At [facility],  you and your health needs are our priority.  As part of our continuing mission to provide you with exceptional heart care, we have created designated Provider Care Teams.  These Care Teams include your primary Cardiologist (physician) and Advanced Practice Providers (APPs -  Physician Assistants and Nurse Practitioners) who all work together to provide you with the care you need, when you need it.  We recommend signing up for the patient portal called "MyChart".  Sign up information is provided on this After Visit Summary.  MyChart is used to connect with patients for Virtual Visits (Telemedicine).  Patients are able to view lab/test results, encounter notes, upcoming appointments, etc.  Non-urgent messages can be sent to your provider as well.   To learn more about what you can do with MyChart, go to [email or web address].    Your next appointment:   3 month(s)  The format for your next appointment:   In Person  Provider:   Berniece Salines, DO   Other Instructions  Echocardiogram An echocardiogram is a procedure that uses painless sound waves (ultrasound) to produce an image of the heart. Images from an echocardiogram can provide important information about:  Signs of coronary artery disease (CAD).  Aneurysm detection. An aneurysm is a weak or damaged part of an artery wall that bulges out from the normal force of blood pumping through the body.  Heart size and shape. Changes in the size or shape of the heart can be associated with certain conditions,  including heart failure, aneurysm, and CAD.  Heart muscle function.  Heart valve function.  Signs of a past heart attack.  Fluid buildup around the heart.  Thickening of the heart muscle.  A tumor or infectious growth around the heart valves. Tell a health care provider about:  Any allergies you have.  All medicines you are taking, including vitamins, herbs, eye drops, creams, and over-the-counter medicines.  Any blood disorders you have.  Any surgeries you have had.  Any medical conditions you have.  Whether you are pregnant or may be pregnant. What are the risks? Generally, this is a safe procedure. However, problems may occur, including:  Allergic reaction to dye (contrast) that may be used during the procedure. What happens before the procedure? No specific preparation is needed. You may eat and drink normally. What happens during the procedure?   An IV tube may be inserted into one of your veins.  You may receive contrast through this tube. A contrast is an injection that improves the quality of the pictures from your heart.  A gel will be applied to your chest.  A wand-like tool (transducer) will be moved over your chest. The gel will help to transmit the sound waves from the transducer.  The sound waves will harmlessly bounce off of your heart to allow the heart images to be captured in real-time motion. The images will be recorded on a computer. The procedure may vary among health care providers and hospitals. What happens after the procedure?  You may return to your normal, everyday life, including diet, activities, and medicines, unless your health care provider tells you not to do that. Summary  An echocardiogram is a procedure that uses painless sound waves (ultrasound) to produce an image of the heart.  Images from an echocardiogram can provide important information about the size and shape of your heart, heart muscle function, heart valve function,  and fluid buildup around your heart.  You do not need to do anything to prepare before  this procedure. You may eat and drink normally.  After the echocardiogram is completed, you may return to your normal, everyday life, unless your health care provider tells you not to do that. This information is not intended to replace advice given to you by your health care provider. Make sure you discuss any questions you have with your health care provider. Document Revised: 07/01/2018 Document Reviewed: 04/12/2016 Elsevier Patient Education  [place].      Adopting a Healthy Lifestyle.  Know what a healthy weight is for you (roughly BMI <25) and aim to maintain this   Aim for 7+ servings of fruits and vegetables daily   65-80+ fluid ounces of water or unsweet tea for healthy kidneys   Limit to max 1 drink of alcohol per day; avoid smoking/tobacco   Limit animal fats in diet for cholesterol and heart health - choose grass fed whenever available   Avoid highly processed foods, and foods high in saturated/trans fats   Aim for low stress - take time to unwind and care for your mental health   Aim for 150 min of moderate intensity exercise weekly for heart health, and weights twice weekly for bone health   Aim for 7-9 hours of sleep daily   When it comes to diets, agreement about the perfect plan isnt easy to find, even among the experts. Experts at the [employer] developed an idea known as the Healthy Eating Plate. Just imagine a plate divided into logical, healthy portions.   The emphasis is on diet quality:   Load up on vegetables and fruits - one-half of your plate: Aim for color and variety, and remember that potatoes dont count.   Go for whole grains - one-quarter of your plate: Whole wheat, barley, wheat berries, quinoa, oats, brown rice, and foods made with them. If you want pasta, go with whole wheat pasta.   Protein power - one-quarter of your  plate: Fish, chicken, beans, and nuts are all healthy, versatile protein sources. Limit red meat.   The diet, however, does go beyond the plate, offering a few other suggestions.   Use healthy plant oils, such as olive, canola, soy, corn, sunflower and peanut. Check the labels, and avoid partially hydrogenated oil, which have unhealthy trans fats.   If youre thirsty, drink water. Coffee and tea are good in moderation, but skip sugary drinks and limit milk and dairy products to one or two daily servings.   The type of carbohydrate in the diet is more important than the amount. Some sources of carbohydrates, such as vegetables, fruits, whole grains, and beans-are healthier than others.   Finally, stay active  Signed, Berniece Salines, DO  10/03/2019 3:30 PM    [place]

## 2019-10-13 ENCOUNTER — Other Ambulatory Visit: Payer: Self-pay

## 2019-10-13 ENCOUNTER — Encounter: Payer: Self-pay | Admitting: Pulmonary Disease

## 2019-10-13 ENCOUNTER — Ambulatory Visit (INDEPENDENT_AMBULATORY_CARE_PROVIDER_SITE_OTHER): Payer: Medicare Other | Admitting: Pulmonary Disease

## 2019-10-13 VITALS — BP 120/78 | HR 78 | Temp 98.3°F | Ht 71.0 in | Wt 203.0 lb

## 2019-10-13 DIAGNOSIS — J9611 Chronic respiratory failure with hypoxia: Secondary | ICD-10-CM | POA: Diagnosis not present

## 2019-10-13 DIAGNOSIS — J449 Chronic obstructive pulmonary disease, unspecified: Secondary | ICD-10-CM

## 2019-10-13 DIAGNOSIS — R5381 Other malaise: Secondary | ICD-10-CM

## 2019-10-13 DIAGNOSIS — Z79899 Other long term (current) drug therapy: Secondary | ICD-10-CM

## 2019-10-13 DIAGNOSIS — Z7189 Other specified counseling: Secondary | ICD-10-CM | POA: Insufficient documentation

## 2019-10-13 HISTORY — DX: Other long term (current) drug therapy: Z79.899

## 2019-10-13 MED ORDER — SPIRIVA RESPIMAT 2.5 MCG/ACT IN AERS: 2.0000 | INHALATION_SPRAY | Freq: Every day | RESPIRATORY_TRACT | 0 refills | Status: DC

## 2019-10-13 NOTE — Assessment & Plan Note (Addendum)
Plan:   continue oxygen therapy as prescribed We will refer to pulmonary rehab

## 2019-10-13 NOTE — Assessment & Plan Note (Signed)
Patient leading sedentary lifestyle  Plan: Referred to pulmonary rehab today patient would prefer [facility]

## 2019-10-13 NOTE — Progress Notes (Signed)
@Patient  ID: Mike Mclaughlin, male    DOB: 1935-02-02, 85 y.o.   MRN: [id number]  Chief Complaint  Patient presents with  . Follow-up    pt states the oxygen is working well    Referring provider: Elenore Paddy, NP  HPI:  85 year old male former smoker followed in our office for COPD  PMH: GERD, dyslipidemia, hypertension Smoker/ Smoking History: Former smoker.  Quit 2015 Maintenance: Symbicort 160, Spiriva Respimat 2.5 Pt of: Dr. Loanne Drilling   10/13/2019  - Visit   85 year old male former smoker followed in our office for COPD.  Patient is followed by Dr. Loanne Drilling.  Last office visit was in April/2021 with myself.  Plan of care from that office visit was for the patient to continue to wear his oxygen to maintain oxygen saturations above 88%, pulmonary function testing was ordered.  Chest x-ray was performed.  Patient was encouraged to remain on Symbicort 160 and Spiriva Respimat 2.5.  We offered a home physical therapy referral which the patient declined due to physical deconditioning.  Patient has been doing well since last being seen.  He admits that he has not improved his overall physical activity.  We will discuss this today.  He remains adherent to Symbicort 160 and Spiriva Respimat 2.5.  He reports now that unfortunately is in the donut hole.  He is requesting samples today.  We will discuss this.  He is using his rescue inhaler 1 time a day.  He feels that his breathing has improved since he has become more adherent with using his oxygen.  Questionaires / Pulmonary Flowsheets:   ACT:  No flowsheet data found.  MMRC: mMRC Dyspnea Scale mMRC Score  07/12/2019 3    Epworth:  No flowsheet data found.  Tests:   04/13/2019-CBC with differential-eosinophils relative 4.3, eosinophils absolute 0.2  10/23/2015-CT chest without contrast-centrilobular emphysema noted bilaterally, slight decrease in lingular nodule now measuring 4 mm.  Given 2 years of stability on imaging follow-up no  further imaging is needed.  Emphysema and bronchial wall thickening and scattered areas of small airway impaction in lower lobes, coronary artery and aortic atherosclerosis  07/12/2019-chest x-ray-emphysematous disease, irregular opacity in the left lung base indeterminate for nodule, further evaluation with CT is recommended  08/01/2019-there is an area of atelectasis in the lingula that is likely corresponds to the finding seen on the patient's recent chest x-ray, no further follow-up is required, there is some mild bronchial wall thickening and mucous plugging at the lung bases, findings are favored to be secondary to reactive airways disease, small to moderate size hiatal hernia, coronary artery disease, moderate emphysema  11/14/2015-spirometry-FVC 2 (48% predicted), ratio 49, FEV1 1 (33% predicted  07/29/2019-pulmonary function test-FVC 2.28 (57% predicted), postbronchodilator ratio 50, postbronchodilator FEV1 1.34 (48% predicted), positive bronchodilator response, mid flow reversibility, DLCO 11.38 (47% predicted)  FENO:  No results found for: NITRICOXIDE  PFT: PFT Results Latest Ref Rng & Units 07/29/2019  FVC-Pre L 2.28  FVC-Predicted Pre % 57  FVC-Post L 2.71  FVC-Predicted Post % 68  Pre FEV1/FVC % % 48  Post FEV1/FCV % % 50  FEV1-Pre L 1.09  FEV1-Predicted Pre % 39  FEV1-Post L 1.34  DLCO UNC% % 47  DLCO COR %Predicted % 64  TLC L 7.34  TLC % Predicted % 103  RV % Predicted % 163    WALK:  SIX MIN WALK 07/12/2019 04/13/2019 04/28/2016 11/29/2013  Supplimental Oxygen during Test? (L/min) - Yes No No  O2 Flow Rate -  2 - -  Type - Continuous - -  Tech Comments: patient was able to finish his walk . Patient was put on O2 2L/MIN when patient's o2 was at 83%, patient's o2 went to 88% and put patient on o2 3L/MIN,Patient's o2 99% went patient was put on 4l/MIn . Patient's o2 stayed at 100% o2 and pule was 68. Patient walked moderate pace. Dropped to 88% halfway through lap 2. Patient  placed on 2L oxygen and came back up to 98%. Patient did another lap and maintained good O2 sats. normal pace/SOB//lmr Walk completed without difficult.  No c/o from pt during walk.  Lowest o2 sat during walk 91% RA.     Imaging: No results found.  Lab Results:  CBC    Component Value Date/Time   WBC 5.6 04/13/2019 1502   RBC 4.43 04/13/2019 1502   HGB 11.8 (L) 04/13/2019 1502   HCT 37.8 (L) 04/13/2019 1502   PLT 219.0 04/13/2019 1502   MCV 85.3 04/13/2019 1502   MCHC 31.4 04/13/2019 1502   RDW 14.2 04/13/2019 1502   LYMPHSABS 1.4 04/13/2019 1502   MONOABS 0.6 04/13/2019 1502   EOSABS 0.2 04/13/2019 1502   BASOSABS 0.1 04/13/2019 1502    BMET    Component Value Date/Time   NA 140 04/13/2019 1502   K 4.5 04/13/2019 1502   CL 106 04/13/2019 1502   CO2 27 04/13/2019 1502   GLUCOSE 100 (H) 04/13/2019 1502   BUN 24 (H) 04/13/2019 1502   CREATININE 1.25 04/13/2019 1502   CALCIUM 9.0 04/13/2019 1502    BNP No results found for: BNP  ProBNP No results found for: PROBNP  Specialty Problems      Pulmonary Problems   COPD GOLD III           Solitary pulmonary nodule    L lingula 89mm 10/2013.  Rescan 10/2014>>>no change in nodule,  Final CT 10/19/15 Tia Alert) > no change so no dedicated f/u rec       Chronic respiratory failure with hypoxia              DOE (dyspnea on exertion)      Allergies  Allergen Reactions  . Atorvastatin Calcium [Atorvastatin]   . Chantix [Varenicline]   . Other Rash    Immunization History  Administered Date(s) Administered  . DTaP 06/30/2011  . Influenza Nasal 12/21/2018  . Influenza Split 01/01/2015  . Influenza, High Dose Seasonal PF 01/09/2016, 01/12/2018, 01/12/2018  . Influenza-Unspecified 12/22/2012, 12/22/2013, 12/22/2017  . PFIZER SARS-COV-2 Vaccination 04/05/2019, 05/06/2019  . Pneumococcal Conjugate-13 09/28/2013  . Pneumococcal Polysaccharide-23 05/30/2010    Past Medical History:  Diagnosis Date  .  Arthritis   . Cardiac dysrhythmia, unspecified   . Dyslipidemia   . Esophageal reflux   . H/O sinus bradycardia   . Hypertension   . Insomnia, unspecified   . Mixed hyperlipidemia   . Rheumatism   . Skin cancer, basal cell     Tobacco History: Social History   Tobacco Use  Smoking Status Former Smoker  . Packs/day: 0.50  . Years: 15.00  . Pack years: 7.50  . Types: Cigarettes  . Quit date: 08/29/2013  . Years since quitting: 6.1  Smokeless Tobacco Never Used   Counseling given: Yes   Continue to not smoke  Outpatient Encounter Medications as of 10/13/2019  Medication Sig  . albuterol (PROVENTIL HFA;VENTOLIN HFA) 108 (90 Base) MCG/ACT inhaler Inhaler 2 puffs every 4 hours as needed for shortness of breath  .  aspirin 81 MG tablet Take 81 mg by mouth daily.  . budesonide-formoterol (SYMBICORT) 160-4.5 MCG/ACT inhaler Inhale 2 puffs into the lungs 2 (two) times daily.  Marland Kitchen omeprazole (PRILOSEC) 20 MG capsule Take 20 mg by mouth daily.  . OXYGEN 2.5 lpm with sleep  . SPIRIVA RESPIMAT 2.5 MCG/ACT AERS INHALE 2 PUFFS BY MOUTH INTO THE LUNGS DAILY  . Tamsulosin HCl (FLOMAX) 0.4 MG CAPS Take 0.4 mg by mouth daily.   . Tiotropium Bromide Monohydrate (SPIRIVA RESPIMAT) 2.5 MCG/ACT AERS Inhale 2 puffs into the lungs daily.   No facility-administered encounter medications on file as of 10/13/2019.     Review of Systems  Review of Systems  Constitutional: Positive for fatigue. Negative for activity change, chills, fever and unexpected weight change.  HENT: Negative for postnasal drip, rhinorrhea, sinus pressure, sinus pain and sore throat.   Eyes: Negative.   Respiratory: Positive for shortness of breath. Negative for cough and wheezing.   Cardiovascular: Negative for chest pain and palpitations.  Gastrointestinal: Negative for constipation, diarrhea, nausea and vomiting.  Endocrine: Negative.   Genitourinary: Negative.   Musculoskeletal: Negative.   Skin: Negative.    Neurological: Negative for dizziness and headaches.  Psychiatric/Behavioral: Negative.  Negative for dysphoric mood. The patient is not nervous/anxious.   All other systems reviewed and are negative.    Physical Exam  BP 120/78 (BP Location: Left Arm, Cuff Size: Normal)   Pulse 78   Temp 98.3 F (36.8 C) (Oral)   Ht 5\' 11"  (1.803 m)   Wt (!) 203 lb (92.1 kg)   SpO2 98%   BMI 28.31 kg/m   Wt Readings from Last 5 Encounters:  10/13/19 (!) 203 lb (92.1 kg)  10/03/19 201 lb (91.2 kg)  07/12/19 201 lb 3.2 oz (91.3 kg)  04/13/19 199 lb 9.6 oz (90.5 kg)  01/30/17 204 lb (92.5 kg)    BMI Readings from Last 5 Encounters:  10/13/19 28.31 kg/m  10/03/19 28.84 kg/m  07/12/19 28.87 kg/m  04/13/19 28.64 kg/m  01/30/17 29.27 kg/m     Physical Exam Vitals and nursing note reviewed.  Constitutional:      General: He is not in acute distress.    Appearance: Normal appearance. He is obese.  HENT:     Head: Normocephalic and atraumatic.     Right Ear: Hearing, tympanic membrane, ear canal and external ear normal.     Left Ear: Hearing, tympanic membrane, ear canal and external ear normal.     Nose: Nose normal. No mucosal edema or rhinorrhea.     Right Turbinates: Not enlarged.     Left Turbinates: Not enlarged.     Mouth/Throat:     Mouth: Mucous membranes are dry.     Pharynx: Oropharynx is clear. No oropharyngeal exudate.  Eyes:     Pupils: Pupils are equal, round, and reactive to light.  Cardiovascular:     Rate and Rhythm: Normal rate and regular rhythm.     Pulses: Normal pulses.     Heart sounds: Normal heart sounds. No murmur heard.   Pulmonary:     Effort: Pulmonary effort is normal.     Breath sounds: No decreased breath sounds, wheezing or rales.     Comments: Diminished breath sounds  Abdominal:     General: Bowel sounds are normal. There is no distension.     Palpations: Abdomen is soft.     Tenderness: There is no abdominal tenderness.  Musculoskeletal:      Cervical back:  Normal range of motion.     Right lower leg: No edema.     Left lower leg: No edema.  Lymphadenopathy:     Cervical: No cervical adenopathy.  Skin:    General: Skin is warm and dry.     Capillary Refill: Capillary refill takes less than 2 seconds.     Findings: No erythema or rash.  Neurological:     General: No focal deficit present.     Mental Status: He is alert and oriented to person, place, and time.     Motor: No weakness.     Coordination: Coordination normal.     Gait: Gait is intact. Gait normal.  Psychiatric:        Mood and Affect: Mood normal.        Behavior: Behavior normal. Behavior is cooperative.        Thought Content: Thought content normal.        Judgment: Judgment normal.       Assessment & Plan:   Chronic respiratory failure with hypoxia Plan:   continue oxygen therapy as prescribed We will refer to pulmonary rehab  COPD GOLD III Plan: Continue Symbicort 160 Continue Spiriva Respimat 2.5 Continue rescue inhaler every 4-6 hours as needed for shortness of breath or wheezing We will refer you to pulmonary rehab today Continue oxygen therapy as prescribed We will request tanks for you to have at home if there is a power outage Work on increasing your overall physical activity Follow-up with our office in 4 months with Dr. Loanne Drilling  Medication management Patient currently in the donut hole  Plan: Spiriva Respimat samples provided today We will [facility] to help with medication assistance as well as COPD management  Physical deconditioning Patient leading sedentary lifestyle  Plan: Referred to pulmonary rehab today patient would prefer [facility]    Return in about 4 months (around 02/13/2020), or if symptoms worsen or fail to improve, for Follow up with Dr. Loanne Drilling.   Lauraine Rinne, NP 10/13/2019   This appointment required 34 minutes of patient care (this includes precharting, chart review,  review of results, face-to-face care, etc.).

## 2019-10-13 NOTE — Assessment & Plan Note (Signed)
Patient currently in the donut hole  Plan: Spiriva Respimat samples provided today We will refer to [facility] to help with medication assistance as well as COPD management

## 2019-10-13 NOTE — Patient Instructions (Addendum)
You were seen today by Lauraine Rinne, NP  for:   1. COPD GOLD III  Continue Symbicort 160 >>> 2 puffs in the morning right when you wake up, rinse out your mouth after use, 12 hours later 2 puffs, rinse after use >>> Take this daily, no matter what >>> This is not a rescue inhaler   Spiriva Respimat 2.5 >>> 2 puffs daily >>> Do this every day >>>This is not a rescue inhaler   Only use your albuterol as a rescue medication to be used if you can't catch your breath by resting or doing a relaxed purse lip breathing pattern.  - The less you use it, the better it will work when you need it. - Ok to use up to 2 puffs  every 4 hours if you must but call for immediate appointment if use goes up over your usual need - Don't leave home without it !!  (think of it like the spare tire for your car)   Note your daily symptoms > remember "red flags" for COPD:   >>>Increase in cough  >>>increase in sputum production >>>increase in shortness of breath or activity  intolerance.   If you notice these symptoms, please call the office to be seen.    We will refer to pulmonary rehab today   2. Chronic respiratory failure with hypoxia/ noct hypoxemia s significant ex desat  Continue oxygen therapy as prescribed  >>>maintain oxygen saturations greater than 88 percent  >>>if unable to maintain oxygen saturations please contact the office  >>>do not smoke with oxygen  >>>can use nasal saline gel or nasal saline rinses to moisturize nose if oxygen causes dryness   3. Medication management  - AMB Referral to [place] Management  Samples of Spiriva Respimat provided today  I have also referred you to [facility] and care management who will contact you over the phone they can help with patient assistance programs and trying to find a way to make your inhalers more affordable  4. Physical deconditioning  We will refer you to pulmonary rehab   We recommend today:  Orders Placed This Encounter    Procedures  . AMB Referral to [place] Management    Referral Priority:   Routine    Referral Type:   Consultation    Referral Reason:   [facility]-Care Management    Number of Visits Requested:   1   Orders Placed This Encounter  Procedures  . AMB Referral to [place] Management   No orders of the defined types were placed in this encounter.   Follow Up:    Return in about 4 months (around 02/13/2020), or if symptoms worsen or fail to improve, for Follow up with Dr. Loanne Drilling.   Please do your part to reduce the spread of COVID-19:      Reduce your risk of any infection  and COVID19 by using the similar precautions used for avoiding the common cold or flu:  Marland Kitchen Wash your hands often with soap and warm water for at least 20 seconds.  If soap and water are not readily available, use an alcohol-based hand sanitizer with at least 60% alcohol.  . If coughing or sneezing, cover your mouth and nose by coughing or sneezing into the elbow areas of your shirt or coat, into a tissue or into your sleeve (not your hands). Langley Gauss A MASK when in public  . Avoid shaking hands with others and consider head nods or verbal greetings  only. . Avoid touching your eyes, nose, or mouth with unwashed hands.  . Avoid close contact with people who are sick. . Avoid places or events with large numbers of people in one location, like concerts or sporting events. . If you have some symptoms but not all symptoms, continue to monitor at home and seek medical attention if your symptoms worsen. . If you are having a medical emergency, call 911.   [place] / e-Visit: [email or web address]         [facility]: [facility]: [phone]                   [facility]: [phone]     It is flu season:   >>> Best ways to protect herself from the flu: Receive the  yearly flu vaccine, practice good hand hygiene washing with soap and also using hand sanitizer when available, eat a nutritious meals, get adequate rest, hydrate appropriately   Please contact the office if your symptoms worsen or you have concerns that you are not improving.   Thank you for choosing [facility] for your healthcare, and for allowing [place] to partner with you on your healthcare journey. I am thankful to be able to provide care to you today.   Wyn Quaker FNP-C    COPD and Physical Activity Chronic obstructive pulmonary disease (COPD) is a long-term (chronic) condition that affects the lungs. COPD is a general term that can be used to describe many different lung problems that cause lung swelling (inflammation) and limit airflow, including chronic bronchitis and emphysema. The main symptom of COPD is shortness of breath, which makes it harder to do even simple tasks. This can also make it harder to exercise and be active. Talk with your health care provider about treatments to help you breathe better and actions you can take to prevent breathing problems during physical activity. What are the benefits of exercising with COPD? Exercising regularly is an important part of a healthy lifestyle. You can still exercise and do physical activities even though you have COPD. Exercise and physical activity improve your shortness of breath by increasing blood flow (circulation). This causes your heart to pump more oxygen through your body. Moderate exercise can improve your:  Oxygen use.  Energy level.  Shortness of breath.  Strength in your breathing muscles.  Heart health.  Sleep.  Self-esteem and feelings of self-worth.  Depression, stress, and anxiety levels. Exercise can benefit everyone with COPD. The severity of your disease may affect how hard you can exercise, especially at first, but everyone can benefit. Talk with your health care provider about how much exercise  is safe for you, and which activities and exercises are safe for you. What actions can I take to prevent breathing problems during physical activity?  Sign up for a pulmonary rehabilitation program. This type of program may include: ? Education about lung diseases. ? Exercise classes that teach you how to exercise and be more active while improving your breathing. This usually involves:  Exercise using your lower extremities, such as a stationary bicycle.  About 30 minutes of exercise, 2 to 5 times per week, for 6 to 12 weeks  Strength training, such as push ups or leg lifts. ? Nutrition education. ? Group classes in which you can talk with others who also have COPD and learn ways to manage stress.  If you use an  oxygen tank, you should use it while you exercise. Work with your health care provider to adjust your oxygen for your physical activity. Your resting flow rate is different from your flow rate during physical activity.  While you are exercising: ? Take slow breaths. ? Pace yourself and do not try to go too fast. ? Purse your lips while breathing out. Pursing your lips is similar to a kissing or whistling position. ? If doing exercise that uses a quick burst of effort, such as weight lifting:  Breathe in before starting the exercise.  Breathe out during the hardest part of the exercise (such as raising the weights). Where to find support You can find support for exercising with COPD from:  Your health care provider.  A pulmonary rehabilitation program.  Your local health department or community health programs.  Support groups, online or in-person. Your health care provider may be able to recommend support groups. Where to find more information You can find more information about exercising with COPD from:  American Lung Association: ClassInsider.se.  COPD Foundation: https://www.rivera.net/. Contact a health care provider if:  Your symptoms get worse.  You have chest  pain.  You have nausea.  You have a fever.  You have trouble talking or catching your breath.  You want to start a new exercise program or a new activity. Summary  COPD is a general term that can be used to describe many different lung problems that cause lung swelling (inflammation) and limit airflow. This includes chronic bronchitis and emphysema.  Exercise and physical activity improve your shortness of breath by increasing blood flow (circulation). This causes your heart to provide more oxygen to your body.  Contact your health care provider before starting any exercise program or new activity. Ask your health care provider what exercises and activities are safe for you. This information is not intended to replace advice given to you by your health care provider. Make sure you discuss any questions you have with your health care provider. Document Revised: 06/30/2018 Document Reviewed: 04/02/2017 Elsevier Patient Education  2020 [employer].

## 2019-10-13 NOTE — Assessment & Plan Note (Signed)
Plan: Continue Symbicort 160 Continue Spiriva Respimat 2.5 Continue rescue inhaler every 4-6 hours as needed for shortness of breath or wheezing We will refer you to pulmonary rehab today Continue oxygen therapy as prescribed We will request tanks for you to have at home if there is a power outage Work on increasing your overall physical activity Follow-up with our office in 4 months with Dr. Loanne Drilling

## 2019-10-19 ENCOUNTER — Other Ambulatory Visit: Payer: Self-pay

## 2019-10-19 NOTE — Patient Outreach (Signed)
[facility]) Care Management  10/19/2019  Jasiah Elsen [birth year] [id number]   Screening- new referral  Placed call to patient who is in the doughnut hole per referral. No answer. Left a message for patient to return call.  PLAN: will attempt 2nd outreach in 3 days and will mail outreach letter.  Tomasa Rand, RN, BSN, CEN [facility] [employer] [phone]

## 2019-10-21 ENCOUNTER — Other Ambulatory Visit: Payer: Self-pay

## 2019-10-21 ENCOUNTER — Ambulatory Visit (INDEPENDENT_AMBULATORY_CARE_PROVIDER_SITE_OTHER): Payer: Medicare Other

## 2019-10-21 DIAGNOSIS — I272 Pulmonary hypertension, unspecified: Secondary | ICD-10-CM | POA: Diagnosis not present

## 2019-10-21 DIAGNOSIS — R0602 Shortness of breath: Secondary | ICD-10-CM | POA: Diagnosis not present

## 2019-10-21 LAB — ECHOCARDIOGRAM COMPLETE
Area-P 1/2: 2.08 cm2
Calc EF: 56.4 %
S' Lateral: 3.2 cm
Single Plane A2C EF: 55.5 %
Single Plane A4C EF: 57.5 %

## 2019-10-21 NOTE — Progress Notes (Signed)
Complete echocardiogram performed.  Jimmy Zoria Rawlinson RDCS, RVT  

## 2019-10-24 ENCOUNTER — Other Ambulatory Visit: Payer: Self-pay

## 2019-10-24 DIAGNOSIS — R001 Bradycardia, unspecified: Secondary | ICD-10-CM | POA: Diagnosis not present

## 2019-10-24 NOTE — Patient Outreach (Signed)
[facility]) Care Management  10/24/2019  Mike Mclaughlin 1934-12-02 [id number]    Screening:  Placed call to patient who answered and I reviewed reason for call.  Patient reports that he is in the doughnut hole.  Reports he has money to get medications at this time. Also reports that he could get his medications from the [place].   Patient declines wanting [facility] assistance at this time.  PLAN: will close case. Offered to mail an Economist and patient accepted and states he would call if he changes his mind.  Tomasa Rand, RN, BSN, CEN [facility] [employer] [phone]

## 2019-10-25 ENCOUNTER — Telehealth: Payer: Self-pay

## 2019-10-25 NOTE — Telephone Encounter (Signed)
Spoke with patient regarding results and recommendation.  Patient verbalizes understanding and is agreeable to plan of care. Advised patient to call back with any issues or concerns.  

## 2019-10-25 NOTE — Telephone Encounter (Signed)
Left message on patients voicemail to please return our call.   

## 2019-10-25 NOTE — Telephone Encounter (Signed)
-----   Message from Berniece Salines, DO sent at 10/24/2019  8:13 AM EDT ----- There is mild dilatation of the ascending aorta, and  the heart is not fully relaxing like it should ( diastolic dysfunction) ,but otherwise normal. I will discuss it at the next office visit.  No further testing is required at this time.

## 2019-10-25 NOTE — Telephone Encounter (Signed)
error 

## 2019-11-15 ENCOUNTER — Telehealth: Payer: Self-pay | Admitting: Pulmonary Disease

## 2019-11-15 DIAGNOSIS — J449 Chronic obstructive pulmonary disease, unspecified: Secondary | ICD-10-CM | POA: Diagnosis not present

## 2019-11-15 NOTE — Telephone Encounter (Signed)
This order was reentered with an MD ordering, original was sent from a NP. See fax number in message below. [facility] notified that this is in the works.

## 2019-11-16 DIAGNOSIS — J449 Chronic obstructive pulmonary disease, unspecified: Secondary | ICD-10-CM | POA: Diagnosis not present

## 2019-11-16 NOTE — Telephone Encounter (Signed)
New forms will be sent to [employer] re[facility] [place]

## 2019-11-18 DIAGNOSIS — J449 Chronic obstructive pulmonary disease, unspecified: Secondary | ICD-10-CM | POA: Diagnosis not present

## 2019-11-21 DIAGNOSIS — J449 Chronic obstructive pulmonary disease, unspecified: Secondary | ICD-10-CM | POA: Diagnosis not present

## 2019-11-23 DIAGNOSIS — J449 Chronic obstructive pulmonary disease, unspecified: Secondary | ICD-10-CM | POA: Diagnosis not present

## 2019-11-25 DIAGNOSIS — J449 Chronic obstructive pulmonary disease, unspecified: Secondary | ICD-10-CM | POA: Diagnosis not present

## 2019-11-29 DIAGNOSIS — J449 Chronic obstructive pulmonary disease, unspecified: Secondary | ICD-10-CM | POA: Diagnosis not present

## 2019-11-30 DIAGNOSIS — J449 Chronic obstructive pulmonary disease, unspecified: Secondary | ICD-10-CM | POA: Diagnosis not present

## 2019-12-02 DIAGNOSIS — J449 Chronic obstructive pulmonary disease, unspecified: Secondary | ICD-10-CM | POA: Diagnosis not present

## 2019-12-05 DIAGNOSIS — J449 Chronic obstructive pulmonary disease, unspecified: Secondary | ICD-10-CM | POA: Diagnosis not present

## 2019-12-07 DIAGNOSIS — J449 Chronic obstructive pulmonary disease, unspecified: Secondary | ICD-10-CM | POA: Diagnosis not present

## 2019-12-09 DIAGNOSIS — J449 Chronic obstructive pulmonary disease, unspecified: Secondary | ICD-10-CM | POA: Diagnosis not present

## 2019-12-12 DIAGNOSIS — J449 Chronic obstructive pulmonary disease, unspecified: Secondary | ICD-10-CM | POA: Diagnosis not present

## 2019-12-14 DIAGNOSIS — J449 Chronic obstructive pulmonary disease, unspecified: Secondary | ICD-10-CM | POA: Diagnosis not present

## 2019-12-16 DIAGNOSIS — J449 Chronic obstructive pulmonary disease, unspecified: Secondary | ICD-10-CM | POA: Diagnosis not present

## 2019-12-19 DIAGNOSIS — J449 Chronic obstructive pulmonary disease, unspecified: Secondary | ICD-10-CM | POA: Diagnosis not present

## 2019-12-20 DIAGNOSIS — Z9981 Dependence on supplemental oxygen: Secondary | ICD-10-CM | POA: Diagnosis not present

## 2019-12-20 DIAGNOSIS — Z23 Encounter for immunization: Secondary | ICD-10-CM | POA: Diagnosis not present

## 2019-12-20 DIAGNOSIS — J9611 Chronic respiratory failure with hypoxia: Secondary | ICD-10-CM | POA: Diagnosis not present

## 2019-12-20 DIAGNOSIS — K5904 Chronic idiopathic constipation: Secondary | ICD-10-CM | POA: Diagnosis not present

## 2019-12-20 DIAGNOSIS — J449 Chronic obstructive pulmonary disease, unspecified: Secondary | ICD-10-CM | POA: Diagnosis not present

## 2019-12-21 DIAGNOSIS — J449 Chronic obstructive pulmonary disease, unspecified: Secondary | ICD-10-CM | POA: Diagnosis not present

## 2019-12-23 DIAGNOSIS — J449 Chronic obstructive pulmonary disease, unspecified: Secondary | ICD-10-CM | POA: Diagnosis not present

## 2019-12-26 DIAGNOSIS — J449 Chronic obstructive pulmonary disease, unspecified: Secondary | ICD-10-CM | POA: Diagnosis not present

## 2019-12-28 DIAGNOSIS — J449 Chronic obstructive pulmonary disease, unspecified: Secondary | ICD-10-CM | POA: Diagnosis not present

## 2019-12-30 DIAGNOSIS — J449 Chronic obstructive pulmonary disease, unspecified: Secondary | ICD-10-CM | POA: Diagnosis not present

## 2020-01-02 DIAGNOSIS — J449 Chronic obstructive pulmonary disease, unspecified: Secondary | ICD-10-CM | POA: Diagnosis not present

## 2020-01-04 DIAGNOSIS — M790 Rheumatism, unspecified: Secondary | ICD-10-CM | POA: Insufficient documentation

## 2020-01-04 DIAGNOSIS — E782 Mixed hyperlipidemia: Secondary | ICD-10-CM | POA: Insufficient documentation

## 2020-01-04 DIAGNOSIS — G4700 Insomnia, unspecified: Secondary | ICD-10-CM | POA: Insufficient documentation

## 2020-01-04 DIAGNOSIS — C4491 Basal cell carcinoma of skin, unspecified: Secondary | ICD-10-CM | POA: Insufficient documentation

## 2020-01-04 DIAGNOSIS — J449 Chronic obstructive pulmonary disease, unspecified: Secondary | ICD-10-CM | POA: Diagnosis not present

## 2020-01-06 ENCOUNTER — Encounter: Payer: Self-pay | Admitting: Cardiology

## 2020-01-06 ENCOUNTER — Ambulatory Visit (INDEPENDENT_AMBULATORY_CARE_PROVIDER_SITE_OTHER): Payer: Medicare Other | Admitting: Cardiology

## 2020-01-06 ENCOUNTER — Other Ambulatory Visit: Payer: Self-pay

## 2020-01-06 VITALS — BP 112/58 | HR 90 | Ht 70.0 in | Wt 203.0 lb

## 2020-01-06 DIAGNOSIS — R001 Bradycardia, unspecified: Secondary | ICD-10-CM

## 2020-01-06 DIAGNOSIS — I10 Essential (primary) hypertension: Secondary | ICD-10-CM | POA: Diagnosis not present

## 2020-01-06 DIAGNOSIS — J449 Chronic obstructive pulmonary disease, unspecified: Secondary | ICD-10-CM | POA: Diagnosis not present

## 2020-01-06 DIAGNOSIS — E782 Mixed hyperlipidemia: Secondary | ICD-10-CM | POA: Diagnosis not present

## 2020-01-06 NOTE — Progress Notes (Signed)
Cardiology Office Note:    Date:  01/06/2020   ID:  [name], DOB 1934-09-22, MRN [id number]  PCP:  Elenore Paddy, NP  Cardiologist:  Berniece Salines, DO  Electrophysiologist:  None   Referring MD: Elenore Paddy, NP   " I am doing well"  History of Present Illness:    Mike Mclaughlin is a 85 y.o. male with a hx of COPD, respiratory failure, chronic persistent bradycardia, hypertension, hyperlipidemia and pulmonary hypertension was referred by his PCP. During his initial evaluation with me the patient shared with me that in the past he wore a monitor he was told that he probably needed a pacemaker but the patient deferred at that time. And did not follow-up with cardiology.  During his initial visit I placed a monitor on the patient, and we share his results with him which showed that his average rate was 48 bpm. The patient had made it clear that he does not want to be considered for a pacemaker unless he feels like he needs it.  He is here today for follow-up visit. He denies any chest pain, lightheadedness or dizziness. He is chronically short of breath due to his chronic respiratory failure.    Past Medical History:  Diagnosis Date  . Arthritis   . Cardiac dysrhythmia, unspecified   . Chronic pain of right knee 09/04/2017  . Chronic respiratory failure with hypoxia 11/14/2015         . COPD GOLD III 11/29/2013      . DOE (dyspnea on exertion) 10/06/2018  . Dyslipidemia   . Esophageal reflux   . H/O sinus bradycardia   . Hypertension   . Insomnia, unspecified   . Malaise and fatigue 10/06/2018  . Medication management 10/13/2019  . Mixed hyperlipidemia   . Physical deconditioning 07/12/2019  . Rheumatism   . Sinus bradycardia 09/04/2017  . Skin cancer, basal cell   . Solitary pulmonary nodule 11/29/2013   L lingula 64mm 10/2013.  Rescan 10/2014>>>no change in nodule,  Final CT 10/19/15 Tia Alert) > no change so no dedicated f/u rec   . Status post right hip replacement 11/18/2017      Past Surgical History:  Procedure Laterality Date  . CATARACT EXTRACTION    . ESOPHAGOGASTRODUODENOSCOPY N/A 01/30/2017   Procedure: ESOPHAGOGASTRODUODENOSCOPY (EGD);  Surgeon: Carol Ada, MD;  Location: [facility];  Service: Endoscopy;  Laterality: N/A;  . hemmorrhoid surgery      Current Medications: Current Meds  Medication Sig  . albuterol (PROVENTIL HFA;VENTOLIN HFA) 108 (90 Base) MCG/ACT inhaler Inhaler 2 puffs every 4 hours as needed for shortness of breath  . aspirin 81 MG tablet Take 81 mg by mouth daily.  . budesonide-formoterol (SYMBICORT) 160-4.5 MCG/ACT inhaler Inhale 2 puffs into the lungs 2 (two) times daily.  Marland Kitchen docusate sodium (COLACE) 100 MG capsule Take 100 mg by mouth daily.  Marland Kitchen omeprazole (PRILOSEC) 20 MG capsule Take 20 mg by mouth daily.  . OXYGEN 2.5 lpm with sleep  . SPIRIVA RESPIMAT 2.5 MCG/ACT AERS INHALE 2 PUFFS BY MOUTH INTO THE LUNGS DAILY  . Tamsulosin HCl (FLOMAX) 0.4 MG CAPS Take 0.4 mg by mouth daily.   . Tiotropium Bromide Monohydrate (SPIRIVA RESPIMAT) 2.5 MCG/ACT AERS Inhale 2 puffs into the lungs daily.     Allergies:   Atorvastatin calcium [atorvastatin], Chantix [varenicline], and Other   Social History   Socioeconomic History  . Marital status: Married    Spouse name: Not on file  . Number of children:  Not on file  . Years of education: Not on file  . Highest education level: Not on file  Occupational History  . Not on file  Tobacco Use  . Smoking status: Former Smoker    Packs/day: 0.50    Years: 15.00    Pack years: 7.50    Types: Cigarettes    Quit date: 08/29/2013    Years since quitting: 6.3  . Smokeless tobacco: Never Used  Vaping Use  . Vaping Use: Never used  Substance and Sexual Activity  . Alcohol use: No  . Drug use: No  . Sexual activity: Not on file  Other Topics Concern  . Not on file  Social History Narrative   Married lives with wife.   Retired   Scientist, physiological  Strain:   . Difficulty of Paying Living Expenses: Not on file  Food Insecurity:   . Worried About Charity fundraiser in the Last Year: Not on file  . Ran Out of Food in the Last Year: Not on file  Transportation Needs:   . Lack of Transportation (Medical): Not on file  . Lack of Transportation (Non-Medical): Not on file  Physical Activity:   . Days of Exercise per Week: Not on file  . Minutes of Exercise per Session: Not on file  Stress:   . Feeling of Stress : Not on file  Social Connections:   . Frequency of Communication with Friends and Family: Not on file  . Frequency of Social Gatherings with Friends and Family: Not on file  . Attends Religious Services: Not on file  . Active Member of Clubs or Organizations: Not on file  . Attends Archivist Meetings: Not on file  . Marital Status: Not on file     Family History: The patient's family history includes Cancer in his mother; Heart disease in his sister; Other in his father; Rheumatologic disease in his mother; Stroke in his father.  ROS:   Review of Systems  Constitution: Negative for decreased appetite, fever and weight gain.  HENT: Negative for congestion, ear discharge, hoarse voice and sore throat.   Eyes: Negative for discharge, redness, vision loss in right eye and visual halos.  Cardiovascular: Negative for chest pain, dyspnea on exertion, leg swelling, orthopnea and palpitations.  Respiratory: Negative for cough, hemoptysis, shortness of breath and snoring.   Endocrine: Negative for heat intolerance and polyphagia.  Hematologic/Lymphatic: Negative for bleeding problem. Does not bruise/bleed easily.  Skin: Negative for flushing, nail changes, rash and suspicious lesions.  Musculoskeletal: Negative for arthritis, joint pain, muscle cramps, myalgias, neck pain and stiffness.  Gastrointestinal: Negative for abdominal pain, bowel incontinence, diarrhea and excessive appetite.  Genitourinary: Negative for  decreased libido, genital sores and incomplete emptying.  Neurological: Negative for brief paralysis, focal weakness, headaches and loss of balance.  Psychiatric/Behavioral: Negative for altered mental status, depression and suicidal ideas.  Allergic/Immunologic: Negative for HIV exposure and persistent infections.    EKGs/Labs/Other Studies Reviewed:    The following studies were reviewed today:   EKG:  The ekg ordered today demonstrates    Recent Labs: 04/13/2019: BUN 24; Creatinine, Ser 1.25; Hemoglobin 11.8; Platelets 219.0; Potassium 4.5; Sodium 140  Recent Lipid Panel No results found for: CHOL, TRIG, HDL, CHOLHDL, VLDL, LDLCALC, LDLDIRECT  Physical Exam:    VS:  BP (!) 112/58   Pulse 90   Ht 5\' 10"  (1.778 m)   Wt 203 lb (92.1 kg)   SpO2  97% Comment: 3L  BMI 29.13 kg/m     Wt Readings from Last 3 Encounters:  01/06/20 203 lb (92.1 kg)  10/13/19 (!) 203 lb (92.1 kg)  10/03/19 201 lb (91.2 kg)     GEN: Well nourished, well developed in no acute distress HEENT: Normal NECK: No JVD; No carotid bruits LYMPHATICS: No lymphadenopathy CARDIAC: S1S2 noted,RRR, no murmurs, rubs, gallops RESPIRATORY:  Clear to auscultation without rales, wheezing or rhonchi  ABDOMEN: Soft, non-tender, non-distended, +bowel sounds, no guarding. EXTREMITIES: No edema, No cyanosis, no clubbing MUSCULOSKELETAL:  No deformity  SKIN: Warm and dry NEUROLOGIC:  Alert and oriented x 3, non-focal PSYCHIATRIC:  Normal affect, good insight  ASSESSMENT:    1. Primary hypertension   2. Sinus bradycardia   3. Mixed hyperlipidemia    PLAN:     He still is bradycardic but he denies any symptoms. At this time he is not interested in pursuing any pacemaker evaluation. I recommended that the patient see my EP colleagues just for consultation but he has deferred. We will continue to follow the patient and I have advised him strongly to notify my office if he gets any symptoms of lightheadedness,  worsening shortness of breath or any syncope episodes.  Lifestyle modification for his hyperlipidemia  The patient is in agreement with the above plan. The patient left the office in stable condition.  The patient will follow up in 6 months or sooner if needed.   Medication Adjustments/Labs and Tests Ordered: Current medicines are reviewed at length with the patient today.  Concerns regarding medicines are outlined above.  No orders of the defined types were placed in this encounter.  No orders of the defined types were placed in this encounter.   Patient Instructions  Medication Instructions:  Your physician recommends that you continue on your current medications as directed. Please refer to the Current Medication list given to you today.  *If you need a refill on your cardiac medications before your next appointment, please call your pharmacy*   Lab Work: None. If you have labs (blood work) drawn today and your tests are completely normal, you will receive your results only by: Marland Kitchen MyChart Message (if you have MyChart) OR . A paper copy in the mail If you have any lab test that is abnormal or we need to change your treatment, we will call you to review the results.   Testing/Procedures: None.    Follow-Up: At [facility], you and your health needs are our priority.  As part of our continuing mission to provide you with exceptional heart care, we have created designated Provider Care Teams.  These Care Teams include your primary Cardiologist (physician) and Advanced Practice Providers (APPs -  Physician Assistants and Nurse Practitioners) who all work together to provide you with the care you need, when you need it.  We recommend signing up for the patient portal called "MyChart".  Sign up information is provided on this After Visit Summary.  MyChart is used to connect with patients for Virtual Visits (Telemedicine).  Patients are able to view lab/test results, encounter  notes, upcoming appointments, etc.  Non-urgent messages can be sent to your provider as well.   To learn more about what you can do with MyChart, go to [email or web address].    Your next appointment:   6 month(s)  The format for your next appointment:   In Person  Provider:   Berniece Salines, DO   Other Instructions      Adopting  a Healthy Lifestyle.  Know what a healthy weight is for you (roughly BMI <25) and aim to maintain this   Aim for 7+ servings of fruits and vegetables daily   65-80+ fluid ounces of water or unsweet tea for healthy kidneys   Limit to max 1 drink of alcohol per day; avoid smoking/tobacco   Limit animal fats in diet for cholesterol and heart health - choose grass fed whenever available   Avoid highly processed foods, and foods high in saturated/trans fats   Aim for low stress - take time to unwind and care for your mental health   Aim for 150 min of moderate intensity exercise weekly for heart health, and weights twice weekly for bone health   Aim for 7-9 hours of sleep daily   When it comes to diets, agreement about the perfect plan isnt easy to find, even among the experts. Experts at the [employer] developed an idea known as the Healthy Eating Plate. Just imagine a plate divided into logical, healthy portions.   The emphasis is on diet quality:   Load up on vegetables and fruits - one-half of your plate: Aim for color and variety, and remember that potatoes dont count.   Go for whole grains - one-quarter of your plate: Whole wheat, barley, wheat berries, quinoa, oats, brown rice, and foods made with them. If you want pasta, go with whole wheat pasta.   Protein power - one-quarter of your plate: Fish, chicken, beans, and nuts are all healthy, versatile protein sources. Limit red meat.   The diet, however, does go beyond the plate, offering a few other suggestions.   Use healthy plant oils, such as olive, canola, soy,  corn, sunflower and peanut. Check the labels, and avoid partially hydrogenated oil, which have unhealthy trans fats.   If youre thirsty, drink water. Coffee and tea are good in moderation, but skip sugary drinks and limit milk and dairy products to one or two daily servings.   The type of carbohydrate in the diet is more important than the amount. Some sources of carbohydrates, such as vegetables, fruits, whole grains, and beans-are healthier than others.   Finally, stay active  Signed, Berniece Salines, DO  01/06/2020 11:00 PM    [place]

## 2020-01-06 NOTE — Patient Instructions (Signed)

## 2020-01-09 DIAGNOSIS — J449 Chronic obstructive pulmonary disease, unspecified: Secondary | ICD-10-CM | POA: Diagnosis not present

## 2020-01-11 DIAGNOSIS — J449 Chronic obstructive pulmonary disease, unspecified: Secondary | ICD-10-CM | POA: Diagnosis not present

## 2020-01-13 DIAGNOSIS — J449 Chronic obstructive pulmonary disease, unspecified: Secondary | ICD-10-CM | POA: Diagnosis not present

## 2020-01-16 DIAGNOSIS — J449 Chronic obstructive pulmonary disease, unspecified: Secondary | ICD-10-CM | POA: Diagnosis not present

## 2020-01-18 DIAGNOSIS — J449 Chronic obstructive pulmonary disease, unspecified: Secondary | ICD-10-CM | POA: Diagnosis not present

## 2020-01-20 DIAGNOSIS — J449 Chronic obstructive pulmonary disease, unspecified: Secondary | ICD-10-CM | POA: Diagnosis not present

## 2020-01-23 DIAGNOSIS — Z79899 Other long term (current) drug therapy: Secondary | ICD-10-CM | POA: Diagnosis not present

## 2020-01-23 DIAGNOSIS — J9611 Chronic respiratory failure with hypoxia: Secondary | ICD-10-CM | POA: Diagnosis not present

## 2020-01-23 DIAGNOSIS — R5381 Other malaise: Secondary | ICD-10-CM | POA: Diagnosis not present

## 2020-01-23 DIAGNOSIS — J449 Chronic obstructive pulmonary disease, unspecified: Secondary | ICD-10-CM | POA: Diagnosis not present

## 2020-01-25 DIAGNOSIS — R5381 Other malaise: Secondary | ICD-10-CM | POA: Diagnosis not present

## 2020-01-25 DIAGNOSIS — J449 Chronic obstructive pulmonary disease, unspecified: Secondary | ICD-10-CM | POA: Diagnosis not present

## 2020-01-25 DIAGNOSIS — J9611 Chronic respiratory failure with hypoxia: Secondary | ICD-10-CM | POA: Diagnosis not present

## 2020-01-25 DIAGNOSIS — Z79899 Other long term (current) drug therapy: Secondary | ICD-10-CM | POA: Diagnosis not present

## 2020-01-27 DIAGNOSIS — Z79899 Other long term (current) drug therapy: Secondary | ICD-10-CM | POA: Diagnosis not present

## 2020-01-27 DIAGNOSIS — R5381 Other malaise: Secondary | ICD-10-CM | POA: Diagnosis not present

## 2020-01-27 DIAGNOSIS — J9611 Chronic respiratory failure with hypoxia: Secondary | ICD-10-CM | POA: Diagnosis not present

## 2020-01-27 DIAGNOSIS — J449 Chronic obstructive pulmonary disease, unspecified: Secondary | ICD-10-CM | POA: Diagnosis not present

## 2020-01-30 DIAGNOSIS — J449 Chronic obstructive pulmonary disease, unspecified: Secondary | ICD-10-CM | POA: Diagnosis not present

## 2020-01-30 DIAGNOSIS — R5381 Other malaise: Secondary | ICD-10-CM | POA: Diagnosis not present

## 2020-01-30 DIAGNOSIS — J9611 Chronic respiratory failure with hypoxia: Secondary | ICD-10-CM | POA: Diagnosis not present

## 2020-01-30 DIAGNOSIS — Z79899 Other long term (current) drug therapy: Secondary | ICD-10-CM | POA: Diagnosis not present

## 2020-01-31 DIAGNOSIS — Z23 Encounter for immunization: Secondary | ICD-10-CM | POA: Diagnosis not present

## 2020-02-01 DIAGNOSIS — Z79899 Other long term (current) drug therapy: Secondary | ICD-10-CM | POA: Diagnosis not present

## 2020-02-01 DIAGNOSIS — J9611 Chronic respiratory failure with hypoxia: Secondary | ICD-10-CM | POA: Diagnosis not present

## 2020-02-01 DIAGNOSIS — R5381 Other malaise: Secondary | ICD-10-CM | POA: Diagnosis not present

## 2020-02-01 DIAGNOSIS — J449 Chronic obstructive pulmonary disease, unspecified: Secondary | ICD-10-CM | POA: Diagnosis not present

## 2020-02-03 DIAGNOSIS — Z79899 Other long term (current) drug therapy: Secondary | ICD-10-CM | POA: Diagnosis not present

## 2020-02-03 DIAGNOSIS — J449 Chronic obstructive pulmonary disease, unspecified: Secondary | ICD-10-CM | POA: Diagnosis not present

## 2020-02-03 DIAGNOSIS — R5381 Other malaise: Secondary | ICD-10-CM | POA: Diagnosis not present

## 2020-02-03 DIAGNOSIS — J9611 Chronic respiratory failure with hypoxia: Secondary | ICD-10-CM | POA: Diagnosis not present

## 2020-02-14 ENCOUNTER — Other Ambulatory Visit: Payer: Self-pay

## 2020-02-14 ENCOUNTER — Encounter: Payer: Self-pay | Admitting: Pulmonary Disease

## 2020-02-14 ENCOUNTER — Ambulatory Visit (INDEPENDENT_AMBULATORY_CARE_PROVIDER_SITE_OTHER): Payer: Medicare Other | Admitting: Pulmonary Disease

## 2020-02-14 DIAGNOSIS — J449 Chronic obstructive pulmonary disease, unspecified: Secondary | ICD-10-CM

## 2020-02-14 DIAGNOSIS — Z79899 Other long term (current) drug therapy: Secondary | ICD-10-CM

## 2020-02-14 DIAGNOSIS — R5381 Other malaise: Secondary | ICD-10-CM | POA: Diagnosis not present

## 2020-02-14 DIAGNOSIS — J9611 Chronic respiratory failure with hypoxia: Secondary | ICD-10-CM | POA: Diagnosis not present

## 2020-02-14 MED ORDER — SPIRIVA RESPIMAT 2.5 MCG/ACT IN AERS: 2.0000 | INHALATION_SPRAY | Freq: Every day | RESPIRATORY_TRACT | 6 refills | Status: DC

## 2020-02-14 MED ORDER — BUDESONIDE-FORMOTEROL FUMARATE 160-4.5 MCG/ACT IN AERO
2.0000 | INHALATION_SPRAY | Freq: Two times a day (BID) | RESPIRATORY_TRACT | 6 refills | Status: DC
Start: 2020-02-14 — End: 2020-08-13

## 2020-02-14 NOTE — Patient Instructions (Signed)
Very severe COPD CONTINUE Spiriva 2.5 mcg TWO puffs ONCE a day CONTINUE Symbicort 160-4.5 mcg TWO puffs TWICE a day CONTINUE Proair AS NEEDED for shortness of breath and wheezing Congratulations on completing Pulmonary Rehab! Continue maintenance therapy as often as you would like.  Chronic hypoxemic respiratory failure CONTINUE supplemental oxygen to maintain levels >88% with activity and sleep We will perform ambulatory O2 at your next visit  Follow-up in 6 months

## 2020-02-14 NOTE — Progress Notes (Signed)
Subjective:   PATIENT ID: Mike Mclaughlin GENDER: male DOB: December 30, 1934, MRN: [id number]   HPI  Chief Complaint  Patient presents with  . Follow-up    12 weeks of pulm rehab done.  extensive walking makes him more [facility]    Reason for Visit: Follow-up   [name] 85 year old male with COPD and bradycardia who presents for follow-up.  Since our last visit, he has completed 12 weeks of Pulmonary Rehab in [place]. He feels that this has significantly improved his strength and is no longer using a cane. While there he focused on upper and lower body strength building as well as treadmill use. He enjoyed his time there and is considering continuing maintenance therapy. Dyspnea has improved and he overall is more mobile. He is compliant with his oxygen and bronchodilators. He currently is in the donut hole but is able to afford the medications and believes they help his overall condition.  Social History: 1/2ppd x 20 years. Quit 2013.  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  I have personally reviewed patient's past medical/family/social history/allergies/current medications.  Past Medical History:  Diagnosis Date  . Arthritis   . Cardiac dysrhythmia, unspecified   . Chronic pain of right knee 09/04/2017  . Chronic respiratory failure with hypoxia 11/14/2015         . COPD GOLD III 11/29/2013      . DOE (dyspnea on exertion) 10/06/2018  . Dyslipidemia   . Esophageal reflux   . H/O sinus bradycardia   . Hypertension   . Insomnia, unspecified   . Malaise and fatigue 10/06/2018  . Medication management 10/13/2019  . Mixed hyperlipidemia   . Physical deconditioning 07/12/2019  . Rheumatism   . Sinus bradycardia 09/04/2017  . Skin cancer, basal cell   . Solitary pulmonary nodule 11/29/2013   L lingula 40mm 10/2013.  Rescan 10/2014>>>no change in nodule,  Final CT 10/19/15 Tia Alert) > no change so no dedicated f/u rec   . Status post  right hip replacement 11/18/2017      Outpatient Medications Prior to Visit  Medication Sig Dispense Refill  . albuterol (PROVENTIL HFA;VENTOLIN HFA) 108 (90 Base) MCG/ACT inhaler Inhaler 2 puffs every 4 hours as needed for shortness of breath 1 Inhaler 2  . aspirin 81 MG tablet Take 81 mg by mouth daily.    Marland Kitchen docusate sodium (COLACE) 100 MG capsule Take 100 mg by mouth daily.    Marland Kitchen omeprazole (PRILOSEC) 20 MG capsule Take 20 mg by mouth daily.    . OXYGEN 2.5 lpm with sleep    . SPIRIVA RESPIMAT 2.5 MCG/ACT AERS INHALE 2 PUFFS BY MOUTH INTO THE LUNGS DAILY 4 g 2  . Tamsulosin HCl (FLOMAX) 0.4 MG CAPS Take 0.4 mg by mouth daily.     . budesonide-formoterol (SYMBICORT) 160-4.5 MCG/ACT inhaler Inhale 2 puffs into the lungs 2 (two) times daily. 1 Inhaler 11  . Tiotropium Bromide Monohydrate (SPIRIVA RESPIMAT) 2.5 MCG/ACT AERS Inhale 2 puffs into the lungs daily. 4 g 0   No facility-administered medications prior to visit.    Review of Systems  Constitutional: Negative for chills, diaphoresis, fever, malaise/fatigue and weight loss.  HENT: Negative for congestion.   Respiratory: Positive for shortness of breath. Negative for cough, hemoptysis, sputum production and wheezing.   Cardiovascular: Negative for chest pain, palpitations and leg swelling.     Objective:   Vitals:   02/14/20 1333  BP: 140/62  Pulse: (!) 44  Temp: 97.6 F (36.4 C)  TempSrc: Tympanic  SpO2: 97%  Weight: 201 lb 8 oz (91.4 kg)   SpO2: 97 %  Physical Exam: General: Well-appearing, no acute distress HENT: [place], AT Eyes: EOMI, no scleral icterus Respiratory: Diminished breath sounds bilaterally.  No crackles, wheezing or rales Cardiovascular: RRR, -M/R/G, no JVD Extremities:-Edema,-tenderness Neuro: AAO x4, CNII-XII grossly intact Skin: Intact, no rashes or bruising Psych: Normal mood, normal affect  Data Reviewed:  Imaging: CT Chest 10/23/15 (report only) - Centrilobular emphysema. 59mm lingular nodule  improved compared to prior imaging  PFT: 11/14/15 FVC 2.0 (48%) FEV1 1.0 (33%) Ratio 49  Interpretation: Very severe obstructive defect present  Labs: CBC    Component Value Date/Time   WBC 5.6 04/13/2019 1502   RBC 4.43 04/13/2019 1502   HGB 11.8 (L) 04/13/2019 1502   HCT 37.8 (L) 04/13/2019 1502   PLT 219.0 04/13/2019 1502   MCV 85.3 04/13/2019 1502   MCHC 31.4 04/13/2019 1502   RDW 14.2 04/13/2019 1502   LYMPHSABS 1.4 04/13/2019 1502   MONOABS 0.6 04/13/2019 1502   EOSABS 0.2 04/13/2019 1502   BASOSABS 0.1 04/13/2019 1502   BMET    Component Value Date/Time   NA 140 04/13/2019 1502   K 4.5 04/13/2019 1502   CL 106 04/13/2019 1502   CO2 27 04/13/2019 1502   GLUCOSE 100 (H) 04/13/2019 1502   BUN 24 (H) 04/13/2019 1502   CREATININE 1.25 04/13/2019 1502   CALCIUM 9.0 04/13/2019 1502   Ambulatory O2 Patient Saturations on Room Air at Rest = 97%  Patient Saturations on Room Air while Ambulating = 88%  Patient Saturations on 2 Liters of oxygen while Ambulating = 96%  Imaging, labs and test noted above have been reviewed independently by me.    Assessment & Plan:   Discussion: 84 year old male with very severe COPD and chronic hypoxemic respiratory failure who presents for follow-up. We discussed inhaler management and pulmonary rehab. Encouraged continuing rehab maintenance sessions if able.  Very severe COPD CONTINUE Spiriva 2.5 mcg TWO puffs ONCE a day CONTINUE Symbicort 160-4.5 mcg TWO puffs TWICE a day CONTINUE Proair AS NEEDED for shortness of breath and wheezing Congratulations on completing Pulmonary Rehab! Continue maintenance therapy as often as you would like.  Chronic hypoxemic respiratory failure CONTINUE supplemental oxygen to maintain levels >88% with activity and sleep We will perform ambulatory O2 at your next visit  Health Maintenance Immunization History  Administered Date(s) Administered  . DTaP 06/30/2011  . Influenza Nasal 12/21/2018   . Influenza Split 01/01/2015  . Influenza, High Dose Seasonal PF 01/09/2016, 01/12/2018, 01/12/2018, 12/26/2019  . Influenza-Unspecified 12/22/2012, 12/22/2013, 12/22/2017  . PFIZER SARS-COV-2 Vaccination 04/05/2019, 05/06/2019, 02/01/2020  . Pneumococcal Conjugate-13 09/28/2013  . Pneumococcal Polysaccharide-23 05/30/2010  Planning for COVID-19 vaccine this week  CT Lung Screen - not qualified  No orders of the defined types were placed in this encounter.  Meds ordered this encounter  Medications  . budesonide-formoterol (SYMBICORT) 160-4.5 MCG/ACT inhaler    Sig: Inhale 2 puffs into the lungs 2 (two) times daily.    Dispense:  6 g    Refill:  6    Order Specific Question:   Lot Number?    Answer:   [id number]    Order Specific Question:   Expiration Date?    Answer:   09/21/2016    Order Specific Question:   Manufacturer?    Answer:   [employer] [[id number]]    Order Specific  Question:   Quantity    Answer:   1  . Tiotropium Bromide Monohydrate (SPIRIVA RESPIMAT) 2.5 MCG/ACT AERS    Sig: Inhale 2 puffs into the lungs daily.    Dispense:  4 g    Refill:  6    Order Specific Question:   Lot Number?    Answer:   [id number]    Order Specific Question:   Expiration Date?    Answer:   12/21/2020    Order Specific Question:   Quantity    Answer:   4    Return in about 6 months (around 08/13/2020).  I have spent a total time of 31-minutes on the day of the appointment reviewing prior documentation, coordinating care and discussing medical diagnosis and plan with the patient/family. Imaging, labs and tests included in this note have been reviewed and interpreted independently by me.  Maxine Huynh Rodman Pickle, MD [facility] 02/14/2020 2:00 PM  Office Number [phone]

## 2020-02-21 DIAGNOSIS — N401 Enlarged prostate with lower urinary tract symptoms: Secondary | ICD-10-CM | POA: Diagnosis not present

## 2020-02-21 DIAGNOSIS — R351 Nocturia: Secondary | ICD-10-CM | POA: Diagnosis not present

## 2020-03-13 DIAGNOSIS — Z9981 Dependence on supplemental oxygen: Secondary | ICD-10-CM | POA: Diagnosis not present

## 2020-03-13 DIAGNOSIS — J449 Chronic obstructive pulmonary disease, unspecified: Secondary | ICD-10-CM | POA: Diagnosis not present

## 2020-03-13 DIAGNOSIS — D649 Anemia, unspecified: Secondary | ICD-10-CM | POA: Diagnosis not present

## 2020-03-13 DIAGNOSIS — J9611 Chronic respiratory failure with hypoxia: Secondary | ICD-10-CM | POA: Diagnosis not present

## 2020-06-12 DIAGNOSIS — J9611 Chronic respiratory failure with hypoxia: Secondary | ICD-10-CM | POA: Diagnosis not present

## 2020-06-12 DIAGNOSIS — J449 Chronic obstructive pulmonary disease, unspecified: Secondary | ICD-10-CM | POA: Diagnosis not present

## 2020-06-12 DIAGNOSIS — Z9981 Dependence on supplemental oxygen: Secondary | ICD-10-CM | POA: Diagnosis not present

## 2020-06-12 DIAGNOSIS — I10 Essential (primary) hypertension: Secondary | ICD-10-CM | POA: Diagnosis not present

## 2020-06-12 DIAGNOSIS — D509 Iron deficiency anemia, unspecified: Secondary | ICD-10-CM | POA: Diagnosis not present

## 2020-06-20 DIAGNOSIS — Z1211 Encounter for screening for malignant neoplasm of colon: Secondary | ICD-10-CM | POA: Diagnosis not present

## 2020-06-26 ENCOUNTER — Other Ambulatory Visit: Payer: Self-pay | Admitting: Gastroenterology

## 2020-06-26 DIAGNOSIS — K219 Gastro-esophageal reflux disease without esophagitis: Secondary | ICD-10-CM | POA: Diagnosis not present

## 2020-06-26 DIAGNOSIS — K5904 Chronic idiopathic constipation: Secondary | ICD-10-CM | POA: Diagnosis not present

## 2020-06-26 DIAGNOSIS — Z1211 Encounter for screening for malignant neoplasm of colon: Secondary | ICD-10-CM | POA: Diagnosis not present

## 2020-06-26 DIAGNOSIS — D509 Iron deficiency anemia, unspecified: Secondary | ICD-10-CM | POA: Diagnosis not present

## 2020-07-05 ENCOUNTER — Ambulatory Visit (INDEPENDENT_AMBULATORY_CARE_PROVIDER_SITE_OTHER): Payer: Medicare Other | Admitting: Cardiology

## 2020-07-05 ENCOUNTER — Other Ambulatory Visit: Payer: Self-pay

## 2020-07-05 ENCOUNTER — Encounter: Payer: Self-pay | Admitting: Cardiology

## 2020-07-05 VITALS — BP 138/68 | HR 60 | Ht 70.0 in | Wt 201.2 lb

## 2020-07-05 DIAGNOSIS — J9611 Chronic respiratory failure with hypoxia: Secondary | ICD-10-CM

## 2020-07-05 DIAGNOSIS — E782 Mixed hyperlipidemia: Secondary | ICD-10-CM | POA: Diagnosis not present

## 2020-07-05 DIAGNOSIS — I10 Essential (primary) hypertension: Secondary | ICD-10-CM

## 2020-07-05 DIAGNOSIS — R001 Bradycardia, unspecified: Secondary | ICD-10-CM | POA: Diagnosis not present

## 2020-07-05 NOTE — Progress Notes (Signed)
Cardiology Office Note:    Date:  07/05/2020   ID:  Mike Mclaughlin, DOB 08-Mar-1935, MRN [id number]  PCP:  Elenore Paddy, NP  Cardiologist:  Berniece Salines, DO  Electrophysiologist:  None   Referring MD: Elenore Paddy, NP   I am doing better  History of Present Illness:    Mike Mclaughlin is a 86 y.o. male with a hx of of COPD, respiratory failure on oxygen, chronic persistent bradycardia, hypertension, hyperlipidemia and pulmonary hypertension.  I last saw the patient on January 06, 2020 at that time he appeared to be doing well from a cardiovascular standpoint.  He had not had any symptoms.  We discussed his persistent bradycardia.  I educated the patient on symptoms to be concerned about with his bradycardia and potential evaluation for pacemaker.  Today he says he has not had any lightheadedness, no dizziness, no shortness of breath or any syncope episode.  He also has been using his oxygen as prescribed by his primary provider.  Past Medical History:  Diagnosis Date  . Arthritis   . Cardiac dysrhythmia, unspecified   . Chronic pain of right knee 09/04/2017  . Chronic respiratory failure with hypoxia 11/14/2015         . COPD GOLD III 11/29/2013      . DOE (dyspnea on exertion) 10/06/2018  . Dyslipidemia   . Esophageal reflux   . H/O sinus bradycardia   . Hypertension   . Insomnia, unspecified   . Malaise and fatigue 10/06/2018  . Medication management 10/13/2019  . Mixed hyperlipidemia   . Physical deconditioning 07/12/2019  . Rheumatism   . Sinus bradycardia 09/04/2017  . Skin cancer, basal cell   . Solitary pulmonary nodule 11/29/2013   L lingula 66mm 10/2013.  Rescan 10/2014>>>no change in nodule,  Final CT 10/19/15 Tia Alert) > no change so no dedicated f/u rec   . Status post right hip replacement 11/18/2017    Past Surgical History:  Procedure Laterality Date  . CATARACT EXTRACTION    . ESOPHAGOGASTRODUODENOSCOPY N/A 01/30/2017   Procedure: ESOPHAGOGASTRODUODENOSCOPY (EGD);   Surgeon: Carol Ada, MD;  Location: [facility];  Service: Endoscopy;  Laterality: N/A;  . hemmorrhoid surgery      Current Medications: Current Meds  Medication Sig  . albuterol (PROVENTIL HFA;VENTOLIN HFA) 108 (90 Base) MCG/ACT inhaler Inhaler 2 puffs every 4 hours as needed for shortness of breath  . aspirin 81 MG tablet Take 81 mg by mouth daily.  . budesonide-formoterol (SYMBICORT) 160-4.5 MCG/ACT inhaler Inhale 2 puffs into the lungs 2 (two) times daily.  Marland Kitchen docusate sodium (COLACE) 100 MG capsule Take 100 mg by mouth daily.  Marland Kitchen omeprazole (PRILOSEC) 20 MG capsule Take 20 mg by mouth daily.  . OXYGEN Place 3 L into the nose daily. 2.5 lpm with sleep  . Tamsulosin HCl (FLOMAX) 0.4 MG CAPS Take 0.4 mg by mouth daily.   . Tiotropium Bromide Monohydrate (SPIRIVA RESPIMAT) 2.5 MCG/ACT AERS Inhale 2 puffs into the lungs daily.  . [DISCONTINUED] SPIRIVA RESPIMAT 2.5 MCG/ACT AERS INHALE 2 PUFFS BY MOUTH INTO THE LUNGS DAILY     Allergies:   Atorvastatin calcium [atorvastatin], Chantix [varenicline], and Other   Social History   Socioeconomic History  . Marital status: Married    Spouse name: Not on file  . Number of children: Not on file  . Years of education: Not on file  . Highest education level: Not on file  Occupational History  . Not on file  Tobacco Use  .  Smoking status: Former Smoker    Packs/day: 0.50    Years: 15.00    Pack years: 7.50    Types: Cigarettes    Quit date: 08/29/2013    Years since quitting: 6.8  . Smokeless tobacco: Never Used  Vaping Use  . Vaping Use: Never used  Substance and Sexual Activity  . Alcohol use: No  . Drug use: No  . Sexual activity: Not on file  Other Topics Concern  . Not on file  Social History Narrative   Married lives with wife.   Retired   Investment banker, operational of Radio broadcast assistant Strain: Not on [employer] Insecurity: Not on file  Transportation Needs: Not on file  Physical Activity: Not on file  Stress:  Not on file  Social Connections: Not on file     Family History: The patient's family history includes Cancer in his mother; Heart disease in his sister; Other in his father; Rheumatologic disease in his mother; Stroke in his father.  ROS:   Review of Systems  Constitution: Negative for decreased appetite, fever and weight gain.  HENT: Negative for congestion, ear discharge, hoarse voice and sore throat.   Eyes: Negative for discharge, redness, vision loss in right eye and visual halos.  Cardiovascular: Negative for chest pain, dyspnea on exertion, leg swelling, orthopnea and palpitations.  Respiratory: Negative for cough, hemoptysis, shortness of breath and snoring.   Endocrine: Negative for heat intolerance and polyphagia.  Hematologic/Lymphatic: Negative for bleeding problem. Does not bruise/bleed easily.  Skin: Negative for flushing, nail changes, rash and suspicious lesions.  Musculoskeletal: Negative for arthritis, joint pain, muscle cramps, myalgias, neck pain and stiffness.  Gastrointestinal: Negative for abdominal pain, bowel incontinence, diarrhea and excessive appetite.  Genitourinary: Negative for decreased libido, genital sores and incomplete emptying.  Neurological: Negative for brief paralysis, focal weakness, headaches and loss of balance.  Psychiatric/Behavioral: Negative for altered mental status, depression and suicidal ideas.  Allergic/Immunologic: Negative for HIV exposure and persistent infections.    EKGs/Labs/Other Studies Reviewed:    The following studies were reviewed today:   EKG: None today  Transthoracic echocardiogram July 2021 IMPRESSIONS    1. Left ventricular ejection fraction, by estimation, is 55 to 60%. The  left ventricle has normal function. The left ventricle has no regional  wall motion abnormalities. There is mild concentric left ventricular  hypertrophy. Left ventricular diastolic  parameters are consistent with Grade I diastolic  dysfunction (impaired  relaxation). Elevated left ventricular end-diastolic pressure.  2. Right ventricular systolic function is normal. The right ventricular  size is normal.  3. Left atrial size was moderately dilated.  4. The mitral valve is normal in structure. No evidence of mitral valve  regurgitation. No evidence of mitral stenosis.  5. The aortic valve is tricuspid. Aortic valve regurgitation is mild.  Mild aortic valve sclerosis is present, with no evidence of aortic valve  stenosis.  6. There is mild dilatation of the ascending aorta measuring 37 mm.  7. Sinus bradycardia 41-49 BPM  8. The inferior vena cava is dilated in size with >50% respiratory  variability, suggesting right atrial pressure of 8 mmHg.   FINDINGS  Left Ventricle: Left ventricular ejection fraction, by estimation, is 55  to 60%. The left ventricle has normal function. The left ventricle has no  regional wall motion abnormalities. The left ventricular internal cavity  size was normal in size. There is  mild concentric left ventricular hypertrophy. Left ventricular diastolic  parameters are consistent  with Grade I diastolic dysfunction (impaired  relaxation). Elevated left ventricular end-diastolic pressure.   Right Ventricle: The right ventricular size is normal. No increase in  right ventricular wall thickness. Right ventricular systolic function is  normal. The tricuspid regurgitant velocity is 2.90 m/s, and with an  assumed right atrial pressure of 8 mmHg,  the estimated right ventricular systolic pressure is 09.3 mmHg.   Left Atrium: Left atrial size was moderately dilated.   Right Atrium: Right atrial size was normal in size.   Pericardium: There is no evidence of pericardial effusion.   Mitral Valve: The mitral valve is normal in structure. Normal mobility of  the mitral valve leaflets. No evidence of mitral valve regurgitation. No  evidence of mitral valve stenosis.   Tricuspid  Valve: The tricuspid valve is normal in structure. Tricuspid  valve regurgitation is mild . No evidence of tricuspid stenosis.   Aortic Valve: The aortic valve is tricuspid. Aortic valve regurgitation is  mild. Mild aortic valve sclerosis is present, with no evidence of aortic  valve stenosis.   Pulmonic Valve: The pulmonic valve was normal in structure. Pulmonic valve  regurgitation is not visualized. No evidence of pulmonic stenosis.   Aorta: The aortic root is normal in size and structure. There is mild  dilatation of the ascending aorta measuring 37 mm.   Venous: The inferior vena cava is dilated in size with greater than 50%  respiratory variability, suggesting right atrial pressure of 8 mmHg.   IAS/Shunts: No atrial level shunt detected by color flow Doppler.     Recent Labs: No results found for requested labs within last 8760 hours.  Recent Lipid Panel No results found for: CHOL, TRIG, HDL, CHOLHDL, VLDL, LDLCALC, LDLDIRECT  Physical Exam:    VS:  BP 138/68   Pulse 60   Ht 5\' 10"  (1.778 m)   Wt 201 lb 3.2 oz (91.3 kg)   SpO2 98%   BMI 28.87 kg/m     Wt Readings from Last 3 Encounters:  07/05/20 201 lb 3.2 oz (91.3 kg)  02/14/20 201 lb 8 oz (91.4 kg)  01/06/20 203 lb (92.1 kg)     GEN: Well nourished, well developed in no acute distress HEENT: Normal NECK: No JVD; No carotid bruits LYMPHATICS: No lymphadenopathy CARDIAC: S1S2 noted,RRR, no murmurs, rubs, gallops RESPIRATORY:  Clear to auscultation without rales, wheezing or rhonchi  ABDOMEN: Soft, non-tender, non-distended, +bowel sounds, no guarding. EXTREMITIES: No edema, No cyanosis, no clubbing MUSCULOSKELETAL:  No deformity  SKIN: Warm and dry NEUROLOGIC:  Alert and oriented x 3, non-focal PSYCHIATRIC:  Normal affect, good insight  ASSESSMENT:    1. Sinus bradycardia   2. Hypertension, unspecified type   3. Chronic respiratory failure with hypoxia   4. Mixed hyperlipidemia    PLAN:    He  appears to be doing well from a cardiovascular standpoint. He is finding his colonoscopy for sometime in May and he is looking forward to this. He has not had any symptoms of chest pain, shortness of breath, lightheadedness or dizziness or any syncope episode. Will continue to monitor.  The patient is in agreement with the above plan. The patient left the office in stable condition.  The patient will follow up in 1 year.   Medication Adjustments/Labs and Tests Ordered: Current medicines are reviewed at length with the patient today.  Concerns regarding medicines are outlined above.  No orders of the defined types were placed in this encounter.  No orders of the  defined types were placed in this encounter.   There are no Patient Instructions on file for this visit.   Adopting a Healthy Lifestyle.  Know what a healthy weight is for you (roughly BMI <25) and aim to maintain this   Aim for 7+ servings of fruits and vegetables daily   65-80+ fluid ounces of water or unsweet tea for healthy kidneys   Limit to max 1 drink of alcohol per day; avoid smoking/tobacco   Limit animal fats in diet for cholesterol and heart health - choose grass fed whenever available   Avoid highly processed foods, and foods high in saturated/trans fats   Aim for low stress - take time to unwind and care for your mental health   Aim for 150 min of moderate intensity exercise weekly for heart health, and weights twice weekly for bone health   Aim for 7-9 hours of sleep daily   When it comes to diets, agreement about the perfect plan isnt easy to find, even among the experts. Experts at the [place] developed an idea known as the Healthy Eating Plate. Just imagine a plate divided into logical, healthy portions.   The emphasis is on diet quality:   Load up on vegetables and fruits - one-half of your plate: Aim for color and variety, and remember that potatoes dont count.   Go for  whole grains - one-quarter of your plate: Whole wheat, barley, wheat berries, quinoa, oats, brown rice, and foods made with them. If you want pasta, go with whole wheat pasta.   Protein power - one-quarter of your plate: Fish, chicken, beans, and nuts are all healthy, versatile protein sources. Limit red meat.   The diet, however, does go beyond the plate, offering a few other suggestions.   Use healthy plant oils, such as olive, canola, soy, corn, sunflower and peanut. Check the labels, and avoid partially hydrogenated oil, which have unhealthy trans fats.   If youre thirsty, drink water. Coffee and tea are good in moderation, but skip sugary drinks and limit milk and dairy products to one or two daily servings.   The type of carbohydrate in the diet is more important than the amount. Some sources of carbohydrates, such as vegetables, fruits, whole grains, and beans-are healthier than others.   Finally, stay active  Signed, Berniece Salines, DO  07/05/2020 2:40 PM     [facility]

## 2020-07-05 NOTE — Patient Instructions (Signed)
Medication Instructions:  Your physician recommends that you continue on your current medications as directed. Please refer to the Current Medication list given to you today.  *If you need a refill on your cardiac medications before your next appointment, please call your pharmacy*   Lab Work: None If you have labs (blood work) drawn today and your tests are completely normal, you will receive your results only by: Marland Kitchen MyChart Message (if you have MyChart) OR . A paper copy in the mail If you have any lab test that is abnormal or we need to change your treatment, we will call you to review the results.   Testing/Procedures: None   Follow-Up: At [facility], you and your health needs are our priority.  As part of our continuing mission to provide you with exceptional heart care, we have created designated Provider Care Teams.  These Care Teams include your primary Cardiologist (physician) and Advanced Practice Providers (APPs -  Physician Assistants and Nurse Practitioners) who all work together to provide you with the care you need, when you need it.  We recommend signing up for the patient portal called "MyChart".  Sign up information is provided on this After Visit Summary.  MyChart is used to connect with patients for Virtual Visits (Telemedicine).  Patients are able to view lab/test results, encounter notes, upcoming appointments, etc.  Non-urgent messages can be sent to your provider as well.   To learn more about what you can do with MyChart, go to [email or web address].    Your next appointment:   1 year(s)  The format for your next appointment:   In Person  Provider:   Berniece Salines, DO   Other Instructions  You are okay to get your colonoscopy.

## 2020-07-31 ENCOUNTER — Other Ambulatory Visit (HOSPITAL_COMMUNITY)
Admission: RE | Admit: 2020-07-31 | Discharge: 2020-07-31 | Disposition: A | Discharge status: Home / Self Care | Payer: Medicare Other | Source: Ambulatory Visit | Attending: Gastroenterology | Admitting: Gastroenterology

## 2020-07-31 DIAGNOSIS — Z20822 Contact with and (suspected) exposure to covid-19: Secondary | ICD-10-CM | POA: Insufficient documentation

## 2020-07-31 DIAGNOSIS — Z01812 Encounter for preprocedural laboratory examination: Secondary | ICD-10-CM | POA: Diagnosis not present

## 2020-07-31 LAB — SARS CORONAVIRUS 2 (TAT 6-24 HRS): SARS Coronavirus 2: NEGATIVE

## 2020-07-31 NOTE — Progress Notes (Signed)
Attempted to obtain medical history via telephone, unable to reach at this time. I left a voicemail to return pre surgical testing department's phone call.  

## 2020-08-01 ENCOUNTER — Encounter (HOSPITAL_COMMUNITY): Payer: Self-pay | Admitting: Gastroenterology

## 2020-08-01 ENCOUNTER — Other Ambulatory Visit: Payer: Self-pay

## 2020-08-03 ENCOUNTER — Encounter (HOSPITAL_COMMUNITY)
Admission: RE | Disposition: A | Discharge status: Home / Self Care | Payer: Self-pay | Source: Home / Self Care | Attending: Gastroenterology

## 2020-08-03 ENCOUNTER — Other Ambulatory Visit: Payer: Self-pay

## 2020-08-03 ENCOUNTER — Ambulatory Visit (HOSPITAL_COMMUNITY): Payer: Medicare Other | Admitting: Certified Registered Nurse Anesthetist

## 2020-08-03 ENCOUNTER — Encounter (HOSPITAL_COMMUNITY): Payer: Self-pay | Admitting: Gastroenterology

## 2020-08-03 ENCOUNTER — Ambulatory Visit (HOSPITAL_COMMUNITY)
Admission: RE | Admit: 2020-08-03 | Discharge: 2020-08-03 | Disposition: A | Discharge status: Home / Self Care | Payer: Medicare Other | Attending: Gastroenterology | Admitting: Gastroenterology

## 2020-08-03 DIAGNOSIS — Z808 Family history of malignant neoplasm of other organs or systems: Secondary | ICD-10-CM | POA: Diagnosis not present

## 2020-08-03 DIAGNOSIS — I10 Essential (primary) hypertension: Secondary | ICD-10-CM | POA: Insufficient documentation

## 2020-08-03 DIAGNOSIS — K449 Diaphragmatic hernia without obstruction or gangrene: Secondary | ICD-10-CM | POA: Insufficient documentation

## 2020-08-03 DIAGNOSIS — E782 Mixed hyperlipidemia: Secondary | ICD-10-CM | POA: Diagnosis not present

## 2020-08-03 DIAGNOSIS — I499 Cardiac arrhythmia, unspecified: Secondary | ICD-10-CM | POA: Insufficient documentation

## 2020-08-03 DIAGNOSIS — K222 Esophageal obstruction: Secondary | ICD-10-CM | POA: Diagnosis not present

## 2020-08-03 DIAGNOSIS — Z87891 Personal history of nicotine dependence: Secondary | ICD-10-CM | POA: Insufficient documentation

## 2020-08-03 DIAGNOSIS — Z888 Allergy status to other drugs, medicaments and biological substances status: Secondary | ICD-10-CM | POA: Diagnosis not present

## 2020-08-03 DIAGNOSIS — Z96641 Presence of right artificial hip joint: Secondary | ICD-10-CM | POA: Diagnosis not present

## 2020-08-03 DIAGNOSIS — K5730 Diverticulosis of large intestine without perforation or abscess without bleeding: Secondary | ICD-10-CM | POA: Insufficient documentation

## 2020-08-03 DIAGNOSIS — Z809 Family history of malignant neoplasm, unspecified: Secondary | ICD-10-CM | POA: Insufficient documentation

## 2020-08-03 DIAGNOSIS — K219 Gastro-esophageal reflux disease without esophagitis: Secondary | ICD-10-CM | POA: Insufficient documentation

## 2020-08-03 DIAGNOSIS — D509 Iron deficiency anemia, unspecified: Secondary | ICD-10-CM | POA: Diagnosis not present

## 2020-08-03 DIAGNOSIS — J449 Chronic obstructive pulmonary disease, unspecified: Secondary | ICD-10-CM | POA: Diagnosis not present

## 2020-08-03 DIAGNOSIS — Z8249 Family history of ischemic heart disease and other diseases of the circulatory system: Secondary | ICD-10-CM | POA: Diagnosis not present

## 2020-08-03 DIAGNOSIS — K5520 Angiodysplasia of colon without hemorrhage: Secondary | ICD-10-CM | POA: Insufficient documentation

## 2020-08-03 HISTORY — PX: HOT HEMOSTASIS: SHX5433

## 2020-08-03 HISTORY — PX: COLONOSCOPY WITH PROPOFOL: SHX5780

## 2020-08-03 HISTORY — PX: ESOPHAGOGASTRODUODENOSCOPY (EGD) WITH PROPOFOL: SHX5813

## 2020-08-03 HISTORY — PX: HEMOSTASIS CLIP PLACEMENT: SHX6857

## 2020-08-03 SURGERY — ESOPHAGOGASTRODUODENOSCOPY (EGD) WITH PROPOFOL
Anesthesia: Monitor Anesthesia Care

## 2020-08-03 MED ORDER — EPHEDRINE SULFATE-NACL 50-0.9 MG/10ML-% IV SOSY
PREFILLED_SYRINGE | INTRAVENOUS | Status: DC | PRN
  Administered 2020-08-03: 10 mg via INTRAVENOUS

## 2020-08-03 MED ORDER — PROPOFOL 10 MG/ML IV BOLUS
INTRAVENOUS | Status: DC | PRN
  Administered 2020-08-03: 20 mg via INTRAVENOUS

## 2020-08-03 MED ORDER — PROPOFOL 10 MG/ML IV BOLUS
INTRAVENOUS | Status: AC
  Filled 2020-08-03: qty 20

## 2020-08-03 MED ORDER — LACTATED RINGERS IV SOLN
INTRAVENOUS | Status: AC | PRN
  Administered 2020-08-03: 20 mL/h via INTRAVENOUS

## 2020-08-03 MED ORDER — PROPOFOL 500 MG/50ML IV EMUL
INTRAVENOUS | Status: DC | PRN
  Administered 2020-08-03: 100 ug/kg/min via INTRAVENOUS

## 2020-08-03 MED ORDER — LIDOCAINE 2% (20 MG/ML) 5 ML SYRINGE
INTRAMUSCULAR | Status: DC | PRN
  Administered 2020-08-03: 80 mg via INTRAVENOUS

## 2020-08-03 SURGICAL SUPPLY — 24 items

## 2020-08-03 NOTE — Transfer of Care (Signed)
Immediate Anesthesia Transfer of Care Note  Patient: Mike Mclaughlin  Procedure(s) Performed: ESOPHAGOGASTRODUODENOSCOPY (EGD) WITH PROPOFOL (N/A ) COLONOSCOPY WITH PROPOFOL (N/A ) HOT HEMOSTASIS (ARGON PLASMA COAGULATION/BICAP) (N/A ) HEMOSTASIS CLIP PLACEMENT  Patient Location: PACU and Endoscopy Unit  Anesthesia Type:MAC  Level of Consciousness: awake, alert  and patient cooperative  Airway & Oxygen Therapy: Patient Spontanous Breathing and Patient connected to face mask oxygen  Post-op Assessment: Report given to RN and Post -op Vital signs reviewed and stable  Post vital signs: Reviewed and stable  Last Vitals:  Vitals Value Taken Time  BP    Temp    Pulse 65 08/03/20 1248  Resp 11 08/03/20 1248  SpO2 100 % 08/03/20 1248  Vitals shown include unvalidated device data.  Last Pain:  Vitals:   08/03/20 1140  TempSrc: Temporal  PainSc: 0-No pain         Complications: No complications documented.

## 2020-08-03 NOTE — Op Note (Signed)
[facility] Patient Name: Mike Mclaughlin Procedure Date: 08/03/2020 MRN: [id number] Attending MD: Carol Ada , MD Date of Birth: [birth year] CSN: [id number] Age: 86 Admit Type: Outpatient Procedure:                Colonoscopy Indications:              Iron deficiency anemia Providers:                Carol Ada, MD, Mariana Arn, Tyrone Apple,                            Technician, Ladona Ridgel, Technician, Christell Faith, CRNA Referring MD:              Medicines:                Propofol per Anesthesia Complications:            No immediate complications. Estimated Blood Loss:     Estimated blood loss: none. Procedure:                Pre-Anesthesia Assessment:                           - Prior to the procedure, a History and Physical                            was performed, and patient medications and                            allergies were reviewed. The patient's tolerance of                            previous anesthesia was also reviewed. The risks                            and benefits of the procedure and the sedation                            options and risks were discussed with the patient.                            All questions were answered, and informed consent                            was obtained. Prior Anticoagulants: The patient has                            taken no previous anticoagulant or antiplatelet                            agents. ASA Grade Assessment: III - A patient with                            severe systemic disease. After  reviewing the risks                            and benefits, the patient was deemed in                            satisfactory condition to undergo the procedure.                           - Sedation was administered by an anesthesia                            professional. Deep sedation was attained.                           After obtaining informed consent, the colonoscope                             was passed under direct vision. Throughout the                            procedure, the patient's blood pressure, pulse, and                            oxygen saturations were monitored continuously. The                            [id number]-[id number] ([id number] Olympus pediatric colonscope                            was introduced through the anus and advanced to the                            the cecum, identified by appendiceal orifice and                            ileocecal valve. The colonoscopy was performed                            without difficulty. The patient tolerated the                            procedure well. The quality of the bowel                            preparation was good. The ileocecal valve,                            appendiceal orifice, and rectum were photographed. Scope In: 12:16:10 PM Scope Out: 12:39:59 PM Scope Withdrawal Time: 0 hours 20 minutes 46 seconds  Total Procedure Duration: 0 hours 23 minutes 49 seconds  Findings:      Six medium-sized patchy angiodysplastic lesions without bleeding were       found in the ascending colon and in the cecum. Coagulation for tissue  destruction using monopolar probe was successful. Estimated blood loss       was minimal. To prevent bleeding post-intervention, eight hemostatic       clips were successfully placed (MR conditional). There was no bleeding       at the end of the procedure.      Scattered small and large-mouthed diverticula were found in the sigmoid       colon and descending colon. Impression:               - Six non-bleeding colonic angiodysplastic lesions.                            Treated with a monopolar probe. Clips (MR                            conditional) were placed.                           - Diverticulosis in the sigmoid colon and in the                            descending colon.                           - No specimens collected. Moderate Sedation:       Not Applicable - Patient had care per Anesthesia. Recommendation:           - Patient has a contact number available for                            emergencies. The signs and symptoms of potential                            delayed complications were discussed with the                            patient. Return to normal activities tomorrow.                            Written discharge instructions were provided to the                            patient.                           - Resume previous diet.                           - Continue present medications.                           - Follow HGB.                           - Follow up with Dr. Collene Mares in one month. Procedure Code(s):        --- Professional ---  [phone], Colonoscopy, flexible; with ablation of                            tumor(s), polyp(s), or other lesion(s) (includes                            pre- and post-dilation and guide wire passage, when                            performed) Diagnosis Code(s):        --- Professional ---                           K55.20, Angiodysplasia of colon without hemorrhage                           D50.9, Iron deficiency anemia, unspecified                           K57.30, Diverticulosis of large intestine without                            perforation or abscess without bleeding CPT copyright 2019 [employer]. All rights reserved. The codes documented in this report are preliminary and upon coder review may  be revised to meet current compliance requirements. Carol Ada, MD Carol Ada, MD 08/03/2020 12:48:52 PM This report has been signed electronically. Number of Addenda: 0

## 2020-08-03 NOTE — Anesthesia Preprocedure Evaluation (Addendum)
Anesthesia Evaluation  Patient identified by MRN, date of birth, ID band Patient awake    Reviewed: Allergy & Precautions, NPO status , Patient's Chart, lab work & pertinent test results  History of Anesthesia Complications (+) PROLONGED EMERGENCE  Airway Mallampati: II  TM Distance: >3 FB     Dental   Pulmonary COPD, former smoker,    breath sounds clear to auscultation       Cardiovascular hypertension, + DOE   Rhythm:Regular Rate:Normal     Neuro/Psych    GI/Hepatic Neg liver ROS, GERD  ,  Endo/Other  negative endocrine ROS  Renal/GU negative Renal ROS     Musculoskeletal   Abdominal   Peds  Hematology   Anesthesia Other Findings   Reproductive/Obstetrics                            Anesthesia Physical Anesthesia Plan  ASA: III  Anesthesia Plan: MAC   Post-op Pain Management:    Induction: Intravenous  PONV Risk Score and Plan: 1 and Treatment may vary due to age or medical condition  Airway Management Planned: Nasal Cannula and Simple Face Mask  Additional Equipment:   Intra-op Plan:   Post-operative Plan:   Informed Consent: I have reviewed the patients History and Physical, chart, labs and discussed the procedure including the risks, benefits and alternatives for the proposed anesthesia with the patient or authorized representative who has indicated his/her understanding and acceptance.       Plan Discussed with: CRNA and Anesthesiologist  Anesthesia Plan Comments:         Anesthesia Quick Evaluation

## 2020-08-03 NOTE — Discharge Instructions (Signed)

## 2020-08-03 NOTE — H&P (Signed)
Mike Mclaughlin  HPI: This 86 year old [place] male presents to the office for evaluation of persistent iron deficiency anemia. He is accompanied by his wife today. His last CBC done on 06/12/2020 revealed a hemoglobin of 11.3 gm/dl and on 03/13/2020; prior to that last year his hemoglobin was 11.5 gm/dl with an Iron level of 43 and Ferritin of 10.4. He has 1 BM every 3 days on an average, with no obvious blood or mucus in the stool. He is taking Docusate Sodium 2 tablets 1-2 times per day. He is taking Omeprazole 20 mg a day for acid reflux with good control. He has good appetite and his weight has been stable. He denies having any complaints of abdominal pain, nausea, vomiting, dysphagia or odynophagia. He denies having a family history of colon cancer, celiac sprue or IBD. His last colonoscopy was done on 09/06/2008 which revealed internal hemorrhoids and fragments of hyperplastic polyps were removed from the rectum. He is on 3 L of oxygen 24 hours a day for advanced COPD.    Past Medical History:  Diagnosis Date  . Arthritis   . Cardiac dysrhythmia, unspecified   . Chronic pain of right knee 09/04/2017  . Chronic respiratory failure with hypoxia 11/14/2015         . COPD GOLD III 11/29/2013      . DOE (dyspnea on exertion) 10/06/2018  . Dyslipidemia   . Esophageal reflux   . H/O sinus bradycardia   . Hypertension   . Insomnia, unspecified   . Malaise and fatigue 10/06/2018  . Medication management 10/13/2019  . Mixed hyperlipidemia   . Physical deconditioning 07/12/2019  . Rheumatism   . Sinus bradycardia 09/04/2017  . Skin cancer, basal cell   . Solitary pulmonary nodule 11/29/2013   L lingula 57mm 10/2013.  Rescan 10/2014>>>no change in nodule,  Final CT 10/19/15 Tia Alert) > no change so no dedicated f/u rec   . Status post right hip replacement 11/18/2017    Past Surgical History:  Procedure Laterality Date  . CATARACT EXTRACTION    . ESOPHAGOGASTRODUODENOSCOPY N/A 01/30/2017   Procedure:  ESOPHAGOGASTRODUODENOSCOPY (EGD);  Surgeon: Carol Ada, MD;  Location: [facility];  Service: Endoscopy;  Laterality: N/A;  . hemmorrhoid surgery      Family History  Problem Relation Age of Onset  . Cancer Mother   . Rheumatologic disease Mother   . Other Father        brain tumor  . Stroke Father   . Heart disease Sister     Social History:  reports that he quit smoking about 6 years ago. His smoking use included cigarettes. He has a 7.50 pack-year smoking history. He has never used smokeless tobacco. He reports that he does not drink alcohol and does not use drugs.  Allergies:  Allergies  Allergen Reactions  . Atorvastatin Calcium [Atorvastatin]     Unaware   . Chantix [Varenicline]     Unaware   . Other Rash    Medications: Scheduled: Continuous:  No results found for this or any previous visit (from the past 24 hour(s)).   No results found.  ROS:  As stated above in the HPI otherwise negative.  Height 5\' 10"  (1.778 m), weight 90.7 kg.    PE: Gen: NAD, Alert and Oriented HEENT:  [place]/AT, EOMI Neck: Supple, no LAD Lungs: CTA Bilaterally CV: RRR without M/G/R ABD: Soft, NTND, +BS Ext: No C/C/E  Assessment/Plan: 1) IDA - EGD/colonoscopy.  Alaine Loughney D 08/03/2020, 11:41 AM

## 2020-08-03 NOTE — Anesthesia Procedure Notes (Signed)
Procedure Name: MAC Date/Time: 08/03/2020 11:59 AM Performed by: West Pugh, CRNA Pre-anesthesia Checklist: Patient identified, Emergency Drugs available, Suction available, Patient being monitored and Timeout performed Patient Re-evaluated:Patient Re-evaluated prior to induction Oxygen Delivery Method: Simple face mask Preoxygenation: Pre-oxygenation with 100% oxygen Induction Type: IV induction Placement Confirmation: positive ETCO2 Dental Injury: Teeth and Oropharynx as per pre-operative assessment

## 2020-08-03 NOTE — Anesthesia Postprocedure Evaluation (Signed)
Anesthesia Post Note  Patient: Jonthan Leite  Procedure(s) Performed: ESOPHAGOGASTRODUODENOSCOPY (EGD) WITH PROPOFOL (N/A ) COLONOSCOPY WITH PROPOFOL (N/A ) HOT HEMOSTASIS (ARGON PLASMA COAGULATION/BICAP) (N/A ) HEMOSTASIS CLIP PLACEMENT     Patient location during evaluation: Endoscopy Anesthesia Type: MAC Level of consciousness: awake Pain management: pain level controlled Vital Signs Assessment: post-procedure vital signs reviewed and stable Respiratory status: spontaneous breathing Cardiovascular status: stable Postop Assessment: no apparent nausea or vomiting Anesthetic complications: no   No complications documented.  Last Vitals:  Vitals:   08/03/20 1300 08/03/20 1310  BP: (!) 111/48 (!) 137/54  Pulse: 64 65  Resp: (!) 21 13  Temp:    SpO2: 90% 93%    Last Pain:  Vitals:   08/03/20 1310  TempSrc:   PainSc: 0-No pain                 Torsten Weniger

## 2020-08-03 NOTE — Op Note (Signed)
[facility] Patient Name: Mike Mclaughlin Procedure Date: 08/03/2020 MRN: [id number] Attending MD: Carol Ada , MD Date of Birth: 30-May-1934 CSN: [id number] Age: 86 Admit Type: Outpatient Procedure:                Upper GI endoscopy Indications:              Iron deficiency anemia Providers:                Carol Ada, MD, Mariana Arn, Tyrone Apple,                            Technician, Ladona Ridgel, Technician, Christell Faith, CRNA Referring MD:              Medicines:                Propofol per Anesthesia Complications:            No immediate complications. Estimated Blood Loss:     Estimated blood loss: none. Procedure:                Pre-Anesthesia Assessment:                           - Prior to the procedure, a History and Physical                            was performed, and patient medications and                            allergies were reviewed. The patient's tolerance of                            previous anesthesia was also reviewed. The risks                            and benefits of the procedure and the sedation                            options and risks were discussed with the patient.                            All questions were answered, and informed consent                            was obtained. Prior Anticoagulants: The patient has                            taken no previous anticoagulant or antiplatelet                            agents. ASA Grade Assessment: III - A patient with                            severe systemic  disease. After reviewing the risks                            and benefits, the patient was deemed in                            satisfactory condition to undergo the procedure.                           - Sedation was administered by an anesthesia                            professional. Deep sedation was attained.                           After obtaining informed consent, the  endoscope was                            passed under direct vision. Throughout the                            procedure, the patient's blood pressure, pulse, and                            oxygen saturations were monitored continuously. The                            PCF-H190DL ([id number]) Olympus pediatric colonscope                            was introduced through the mouth, and advanced to                            the second part of duodenum. The upper GI endoscopy                            was accomplished without difficulty. The patient                            tolerated the procedure well. Scope In: Scope Out: Findings:      One benign-appearing, intrinsic moderate (circumferential scarring or       stenosis; an endoscope may pass) stenosis was found at the       gastroesophageal junction. This stenosis measured 1 cm (inner diameter)       x less than one cm (in length). The stenosis was traversed.      A 4 cm hiatal hernia was present.      The stomach was normal.      The examined duodenum was normal.      The distal esophagus was torturous. A benign stricture was identified       and it was dilated with passage of the pediatric colonoscope. Impression:               - Benign-appearing esophageal stenosis.                           -  4 cm hiatal hernia.                           - Normal stomach.                           - Normal examined duodenum.                           - No specimens collected. Moderate Sedation:      Not Applicable - Patient had care per Anesthesia. Recommendation:           - Proceed with the colonoscopy. Procedure Code(s):        --- Professional ---                           [phone], Esophagogastroduodenoscopy, flexible,                            transoral; diagnostic, including collection of                            specimen(s) by brushing or washing, when performed                            (separate procedure) Diagnosis Code(s):         --- Professional ---                           K22.2, Esophageal obstruction                           K44.9, Diaphragmatic hernia without obstruction or                            gangrene                           D50.9, Iron deficiency anemia, unspecified CPT copyright 2019 [employer]. All rights reserved. The codes documented in this report are preliminary and upon coder review may  be revised to meet current compliance requirements. Carol Ada, MD Carol Ada, MD 08/03/2020 12:51:47 PM This report has been signed electronically. Number of Addenda: 0

## 2020-08-07 ENCOUNTER — Encounter (HOSPITAL_COMMUNITY): Payer: Self-pay | Admitting: Gastroenterology

## 2020-08-13 ENCOUNTER — Encounter: Payer: Self-pay | Admitting: Pulmonary Disease

## 2020-08-13 ENCOUNTER — Other Ambulatory Visit: Payer: Self-pay | Admitting: Pulmonary Disease

## 2020-08-13 ENCOUNTER — Ambulatory Visit (INDEPENDENT_AMBULATORY_CARE_PROVIDER_SITE_OTHER): Payer: Medicare Other | Admitting: Pulmonary Disease

## 2020-08-13 ENCOUNTER — Other Ambulatory Visit: Payer: Self-pay

## 2020-08-13 VITALS — BP 122/70 | HR 52 | Temp 98.1°F | Ht 70.0 in | Wt 202.0 lb

## 2020-08-13 DIAGNOSIS — J9611 Chronic respiratory failure with hypoxia: Secondary | ICD-10-CM | POA: Diagnosis not present

## 2020-08-13 DIAGNOSIS — Z79899 Other long term (current) drug therapy: Secondary | ICD-10-CM

## 2020-08-13 DIAGNOSIS — R410 Disorientation, unspecified: Secondary | ICD-10-CM

## 2020-08-13 DIAGNOSIS — R5381 Other malaise: Secondary | ICD-10-CM | POA: Diagnosis not present

## 2020-08-13 DIAGNOSIS — R0902 Hypoxemia: Secondary | ICD-10-CM | POA: Diagnosis not present

## 2020-08-13 DIAGNOSIS — J449 Chronic obstructive pulmonary disease, unspecified: Secondary | ICD-10-CM

## 2020-08-13 MED ORDER — SPIRIVA RESPIMAT 2.5 MCG/ACT IN AERS: 2.0000 | INHALATION_SPRAY | Freq: Every day | RESPIRATORY_TRACT | 6 refills | Status: DC

## 2020-08-13 MED ORDER — ALBUTEROL SULFATE HFA 108 (90 BASE) MCG/ACT IN AERS: INHALATION_SPRAY | RESPIRATORY_TRACT | 2 refills | Status: DC

## 2020-08-13 MED ORDER — SPIRIVA RESPIMAT 2.5 MCG/ACT IN AERS: 2.0000 | INHALATION_SPRAY | Freq: Every day | RESPIRATORY_TRACT | 0 refills | Status: DC

## 2020-08-13 MED ORDER — BUDESONIDE-FORMOTEROL FUMARATE 160-4.5 MCG/ACT IN AERO: 2.0000 | INHALATION_SPRAY | Freq: Two times a day (BID) | RESPIRATORY_TRACT | 6 refills | Status: DC

## 2020-08-13 NOTE — Patient Instructions (Signed)
Very severe COPD CONTINUE Spiriva 2.5 mcg TWO puffs ONCE a day CONTINUE Symbicort 160-4.5 mcg TWO puffs TWICE a day CONTINUE Proair AS NEEDED for shortness of breath and wheezing CONTINUE maintenance therapy as often as you would like.  Chronic hypoxemic respiratory failure Intermittent confusion CONTINUE supplemental oxygen to maintain levels >88% with activity and sleep ORDER arterial blood gas to evaluate for CO2  Follow-up with me in 3 months

## 2020-08-13 NOTE — Progress Notes (Signed)
Subjective:   PATIENT ID: Mike Mclaughlin GENDER: male DOB: [birth year], MRN: [id number]   HPI  Chief Complaint  Patient presents with  . Follow-up    Reports having to increase oxygen from 2.5L to 3L at night due to increased shortness of breath when lying down.     Reason for Visit: Follow-up   [name] is a 86 year old male with chronic hypoxemic respiratory failure secondary to COPD and bradycardia who presents for follow-up. Wife is present  Synopsis: 2021 - Established care with [facility]. Started on Spiriva in January. Compliant on both Symbicort and Spiriva. On home oxygen nightly. Completed Pulmonary Rehab in [place] 2022 - Continued maintenance rehab program. Started requiring O2 with activity.  08/13/20 Since our our last visit he is continuing maintenance program three times a week including 20-30 minutes on tread mill and upper body exercises . He is compliant with Symbicort or Spiriva. He uses his rescue inhaler at least 1-2 times a day.  He has difficulty when walking on inclines.  He reports using more oxygen at night overnight due to shortness of breath. His wife reports episodes of confusion that self resolve. Unclear if this is related to low oxygen levels which his wife reports has dropped to 79% when he sits up.   Social History: 1/2ppd x 20 years. Quit 2013.  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  I have personally reviewed patient's past medical/family/social history/allergies/current medications.  Past Medical History:  Diagnosis Date  . Arthritis   . Cardiac dysrhythmia, unspecified   . Chronic pain of right knee 09/04/2017  . Chronic respiratory failure with hypoxia 11/14/2015         . COPD GOLD III 11/29/2013      . DOE (dyspnea on exertion) 10/06/2018  . Dyslipidemia   . Esophageal reflux   . H/O sinus bradycardia   . Hypertension   . Insomnia, unspecified   . Malaise and fatigue  10/06/2018  . Medication management 10/13/2019  . Mixed hyperlipidemia   . Physical deconditioning 07/12/2019  . Rheumatism   . Sinus bradycardia 09/04/2017  . Skin cancer, basal cell   . Solitary pulmonary nodule 11/29/2013   L lingula 5mm 10/2013.  Rescan 10/2014>>>no change in nodule,  Final CT 10/19/15 Tia Alert) > no change so no dedicated f/u rec   . Status post right hip replacement 11/18/2017      Outpatient Medications Prior to Visit  Medication Sig Dispense Refill  . albuterol (PROVENTIL HFA;VENTOLIN HFA) 108 (90 Base) MCG/ACT inhaler Inhaler 2 puffs every 4 hours as needed for shortness of breath 1 Inhaler 2  . aspirin 81 MG tablet Take 81 mg by mouth daily.    . budesonide-formoterol (SYMBICORT) 160-4.5 MCG/ACT inhaler Inhale 2 puffs into the lungs 2 (two) times daily. 6 g 6  . docusate sodium (COLACE) 100 MG capsule Take 100 mg by mouth daily.    . ferrous sulfate 325 (65 FE) MG tablet Take 325 mg by mouth daily with breakfast.    . omeprazole (PRILOSEC) 20 MG capsule Take 20 mg by mouth daily.    . OXYGEN Place 3 L into the nose daily. 2.5 lpm with sleep    . Tamsulosin HCl (FLOMAX) 0.4 MG CAPS Take 0.4 mg by mouth daily.     . Tiotropium Bromide Monohydrate (SPIRIVA RESPIMAT) 2.5 MCG/ACT AERS Inhale 2 puffs into the lungs daily. 4 g 6   No facility-administered medications prior to visit.  Review of Systems  Constitutional: Negative for chills, diaphoresis, fever, malaise/fatigue and weight loss.  HENT: Negative for congestion.   Respiratory: Positive for shortness of breath. Negative for cough, hemoptysis, sputum production and wheezing.   Cardiovascular: Negative for chest pain, palpitations and leg swelling.  Neurological:       Mild confusion      Objective:   Vitals:   08/13/20 1328  BP: 122/70  Pulse: (!) 52  Temp: 98.1 F (36.7 C)  TempSrc: Temporal  SpO2: 99%  Weight: 202 lb (91.6 kg)  Height: 5\' 10"  (1.778 m)      Physical Exam: General:  Well-appearing, no acute distress HENT: [place], AT Eyes: EOMI, no scleral icterus Respiratory: Diminished breath sounds bilaterally.  No crackles, wheezing or rales Cardiovascular: RRR, -M/R/G, no JVD Extremities:-Edema,-tenderness Neuro: AAO x4, CNII-XII grossly intact Skin: Intact, no rashes or bruising Psych: Normal mood, normal affect  Data Reviewed:  Imaging: CT Chest 10/23/15 (report only) - Centrilobular emphysema. 31mm lingular nodule improved compared to prior imaging CT Chest 08/01/19 - Diffuse moderate centrilobular emphysema. Left lingular atelectasis  PFT: 11/14/15 FVC 2.0 (48%) FEV1 1.0 (33%) Ratio 49  Interpretation: Very severe obstructive defect present  Labs: CBC    Component Value Date/Time   WBC 5.6 04/13/2019 1502   RBC 4.43 04/13/2019 1502   HGB 11.8 (L) 04/13/2019 1502   HCT 37.8 (L) 04/13/2019 1502   PLT 219.0 04/13/2019 1502   MCV 85.3 04/13/2019 1502   MCHC 31.4 04/13/2019 1502   RDW 14.2 04/13/2019 1502   LYMPHSABS 1.4 04/13/2019 1502   MONOABS 0.6 04/13/2019 1502   EOSABS 0.2 04/13/2019 1502   BASOSABS 0.1 04/13/2019 1502   BMET    Component Value Date/Time   NA 140 04/13/2019 1502   K 4.5 04/13/2019 1502   CL 106 04/13/2019 1502   CO2 27 04/13/2019 1502   GLUCOSE 100 (H) 04/13/2019 1502   BUN 24 (H) 04/13/2019 1502   CREATININE 1.25 04/13/2019 1502   CALCIUM 9.0 04/13/2019 1502   Ambulatory O2 08/13/20 Patient Saturations on Room Air at Rest = 96%  Patient Saturations on Room Air while Ambulating = 84%  Patient Saturations on 3 Liters of oxygen while Ambulating = 90%  Imaging, labs and test noted above have been reviewed independently by me.    Assessment & Plan:   Discussion: 86 year old male with chronic hypoxemic respiratory failure secondary to very severe COPD who presents for follow-up. Bronchodilators are optimized. No exacerbations in the last 12 months. Increased O2 requirement during the day, which is new. Not acutely ill so  I suspect this is progression of his COPD. With his episodes of confusion will need to rule out chronic hypercarbia that may benefit from NIV  Very severe COPD CONTINUE Spiriva 2.5 mcg TWO puffs ONCE a day CONTINUE Symbicort 160-4.5 mcg TWO puffs TWICE a day CONTINUE Proair AS NEEDED for shortness of breath and wheezing CONTINUE maintenance therapy as often as you would like.  Chronic hypoxemic respiratory failure secondary to COPD Intermittent confusion CONTINUE supplemental oxygen to maintain levels >88% with activity and sleep ORDER arterial blood gas to evaluate for CO2  Health Maintenance Immunization History  Administered Date(s) Administered  . DTaP 06/30/2011  . Influenza Nasal 12/21/2018  . Influenza Split 01/01/2015  . Influenza, High Dose Seasonal PF 01/09/2016, 01/12/2018, 01/12/2018, 12/26/2019  . Influenza-Unspecified 12/22/2012, 12/22/2013, 12/22/2017  . PFIZER(Purple Top)SARS-COV-2 Vaccination 04/05/2019, 05/06/2019, 02/01/2020  . Pneumococcal Conjugate-13 09/28/2013  . Pneumococcal Polysaccharide-23 05/30/2010  CT Lung Screen - not qualified  Orders Placed This Encounter  Procedures  . Pulmonary Function Test    Standing Status:   Future    Standing Expiration Date:   08/13/2021    Scheduling Instructions:     Please schedule at [facility] Specific Question:   Where should this test be performed?    Answer:   Other    Order Specific Question:   ABG    Answer:   Yes   No orders of the defined types were placed in this encounter.  Return in about 3 months (around 11/13/2020).  I have spent a total time of 31-minutes on the day of the appointment reviewing prior documentation, coordinating care and discussing medical diagnosis and plan with the patient/family. Imaging, labs and tests included in this note have been reviewed and interpreted independently by me.  [place], MD [facility] 08/13/2020 1:28 PM  Office Number  [phone]

## 2020-08-15 DIAGNOSIS — R0902 Hypoxemia: Secondary | ICD-10-CM | POA: Diagnosis not present

## 2020-08-15 DIAGNOSIS — J449 Chronic obstructive pulmonary disease, unspecified: Secondary | ICD-10-CM | POA: Diagnosis not present

## 2020-08-15 DIAGNOSIS — J439 Emphysema, unspecified: Secondary | ICD-10-CM | POA: Diagnosis not present

## 2020-08-24 ENCOUNTER — Encounter: Payer: Self-pay | Admitting: Pulmonary Disease

## 2020-09-06 DIAGNOSIS — K449 Diaphragmatic hernia without obstruction or gangrene: Secondary | ICD-10-CM | POA: Diagnosis not present

## 2020-09-06 DIAGNOSIS — Q2733 Arteriovenous malformation of digestive system vessel: Secondary | ICD-10-CM | POA: Diagnosis not present

## 2020-09-06 DIAGNOSIS — K219 Gastro-esophageal reflux disease without esophagitis: Secondary | ICD-10-CM | POA: Diagnosis not present

## 2020-09-06 DIAGNOSIS — K5730 Diverticulosis of large intestine without perforation or abscess without bleeding: Secondary | ICD-10-CM | POA: Diagnosis not present

## 2020-09-06 DIAGNOSIS — K5900 Constipation, unspecified: Secondary | ICD-10-CM | POA: Diagnosis not present

## 2020-09-06 DIAGNOSIS — D509 Iron deficiency anemia, unspecified: Secondary | ICD-10-CM | POA: Diagnosis not present

## 2020-09-19 DIAGNOSIS — J449 Chronic obstructive pulmonary disease, unspecified: Secondary | ICD-10-CM | POA: Diagnosis not present

## 2020-09-19 DIAGNOSIS — Z9981 Dependence on supplemental oxygen: Secondary | ICD-10-CM | POA: Diagnosis not present

## 2020-09-19 DIAGNOSIS — J9611 Chronic respiratory failure with hypoxia: Secondary | ICD-10-CM | POA: Diagnosis not present

## 2020-09-19 DIAGNOSIS — Z79899 Other long term (current) drug therapy: Secondary | ICD-10-CM | POA: Diagnosis not present

## 2020-09-19 DIAGNOSIS — I10 Essential (primary) hypertension: Secondary | ICD-10-CM | POA: Diagnosis not present

## 2020-09-19 DIAGNOSIS — D509 Iron deficiency anemia, unspecified: Secondary | ICD-10-CM | POA: Diagnosis not present

## 2020-09-19 DIAGNOSIS — E782 Mixed hyperlipidemia: Secondary | ICD-10-CM | POA: Diagnosis not present

## 2020-09-19 DIAGNOSIS — Z0000 Encounter for general adult medical examination without abnormal findings: Secondary | ICD-10-CM | POA: Diagnosis not present

## 2020-10-03 DIAGNOSIS — Z20828 Contact with and (suspected) exposure to other viral communicable diseases: Secondary | ICD-10-CM | POA: Diagnosis not present

## 2021-01-08 DIAGNOSIS — Z23 Encounter for immunization: Secondary | ICD-10-CM | POA: Diagnosis not present

## 2021-01-17 ENCOUNTER — Encounter: Payer: Self-pay | Admitting: Pulmonary Disease

## 2021-01-17 ENCOUNTER — Other Ambulatory Visit: Payer: Self-pay

## 2021-01-17 ENCOUNTER — Ambulatory Visit (INDEPENDENT_AMBULATORY_CARE_PROVIDER_SITE_OTHER): Payer: Medicare Other | Admitting: Pulmonary Disease

## 2021-01-17 DIAGNOSIS — J9611 Chronic respiratory failure with hypoxia: Secondary | ICD-10-CM

## 2021-01-17 DIAGNOSIS — Z79899 Other long term (current) drug therapy: Secondary | ICD-10-CM | POA: Diagnosis not present

## 2021-01-17 DIAGNOSIS — R5381 Other malaise: Secondary | ICD-10-CM

## 2021-01-17 DIAGNOSIS — J449 Chronic obstructive pulmonary disease, unspecified: Secondary | ICD-10-CM

## 2021-01-17 MED ORDER — SPIRIVA RESPIMAT 2.5 MCG/ACT IN AERS: 2.0000 | INHALATION_SPRAY | Freq: Every day | RESPIRATORY_TRACT | 6 refills | Status: DC

## 2021-01-17 MED ORDER — BUDESONIDE-FORMOTEROL FUMARATE 160-4.5 MCG/ACT IN AERO: 2.0000 | INHALATION_SPRAY | Freq: Two times a day (BID) | RESPIRATORY_TRACT | 6 refills | Status: DC

## 2021-01-17 NOTE — Progress Notes (Signed)
Subjective:   PATIENT ID: Mike Mclaughlin GENDER: male DOB: 01/14/1935, MRN: [id number]   HPI  Chief Complaint  Patient presents with   Follow-up    COPD III, doe even with o2 wears out faster   Reason for Visit: Follow-up   [name] is a 86 year old male with chronic hypoxemic respiratory failure secondary to COPD, bradycardia, probable pulmonary hypertension and chronic diastolic heart failure who presents for follow-up.  Synopsis: 2021 - Established care with [facility]. Started on Spiriva in January. Compliant on both Symbicort and Spiriva. On home oxygen nightly. Completed Pulmonary Rehab in [place] 2022 - Continued maintenance rehab program. Started requiring O2 with activity.  08/13/20 Since our our last visit he is continuing maintenance program three times a week including 20-30 minutes on tread mill and upper body exercises . He is compliant with Symbicort or Spiriva. He uses his rescue inhaler at least 1-2 times a day.  He has difficulty when walking on inclines.  He reports using more oxygen at night overnight due to shortness of breath. His wife reports episodes of confusion that self resolve. Unclear if this is related to low oxygen levels which his wife reports has dropped to 79% when he sits up.   01/17/21 Since our last visit he was completed rehab and is now participating in wellness program three times a week. They do not check his oxygen but he wears it continuously during exercise. He is compliant with Symbicort and Spiriva. Both he and his wife had COVID-19 in the summer. Wife reports that his oxygen drops on the pulsed oxygen. He wears continuous oxygen at night. Denies coughing and shortness of breath. Has wheezing at night. Wife reports nightmares where he still asleep but can move around violently and hurt himself (scratches arm).   Social History: 1/2ppd x 20 years. Quit 2013.  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  Past Medical History:  Diagnosis Date   Arthritis    Cardiac dysrhythmia, unspecified    Chronic pain of right knee 09/04/2017   Chronic respiratory failure with hypoxia 11/14/2015          COPD GOLD III 11/29/2013       DOE (dyspnea on exertion) 10/06/2018   Dyslipidemia    Esophageal reflux    H/O sinus bradycardia    Hypertension    Insomnia, unspecified    Malaise and fatigue 10/06/2018   Medication management 10/13/2019   Mixed hyperlipidemia    Physical deconditioning 07/12/2019   Rheumatism    Sinus bradycardia 09/04/2017   Skin cancer, basal cell    Solitary pulmonary nodule 11/29/2013   L lingula 59mm 10/2013.  Rescan 10/2014>>>no change in nodule,  Final CT 10/19/15 Tia Alert) > no change so no dedicated f/u rec    Status post right hip replacement 11/18/2017      Outpatient Medications Prior to Visit  Medication Sig Dispense Refill   albuterol (PROAIR HFA) 108 (90 Base) MCG/ACT inhaler Inhale 1 puff into the lungs every 6 (six) hours as needed for wheezing or shortness of breath. 18 g 5   aspirin 81 MG tablet Take 81 mg by mouth daily.     budesonide-formoterol (SYMBICORT) 160-4.5 MCG/ACT inhaler Inhale 2 puffs into the lungs 2 (two) times daily. 6 g 6   docusate sodium (COLACE) 100 MG capsule Take 100 mg by mouth daily.     ferrous sulfate 325 (65 FE) MG tablet Take 325 mg by mouth daily  with breakfast.     omeprazole (PRILOSEC) 20 MG capsule Take 20 mg by mouth daily.     OXYGEN Place 3 L into the nose daily. 2.5 lpm with sleep     Tamsulosin HCl (FLOMAX) 0.4 MG CAPS Take 0.4 mg by mouth daily.      Tiotropium Bromide Monohydrate (SPIRIVA RESPIMAT) 2.5 MCG/ACT AERS Inhale 2 puffs into the lungs daily. 4 g 0   Tiotropium Bromide Monohydrate (SPIRIVA RESPIMAT) 2.5 MCG/ACT AERS Inhale 2 puffs into the lungs daily. 4 g 6   No facility-administered medications prior to visit.    Review of Systems  Constitutional:  Negative for chills, diaphoresis, fever,  malaise/fatigue and weight loss.  HENT:  Negative for congestion.   Respiratory:  Positive for cough, shortness of breath and wheezing. Negative for hemoptysis and sputum production.   Cardiovascular:  Negative for chest pain, palpitations and leg swelling.    Objective:   Vitals:   01/17/21 1351  BP: (!) 170/62  Pulse: (!) 41  Temp: 97.8 F (36.6 C)  TempSrc: Oral  SpO2: 100%  Weight: 204 lb (92.5 kg)  Height: 5\' 10"  (1.778 m)     Physical Exam: General: Well-appearing, no acute distress HENT: [place], AT Eyes: EOMI, no scleral icterus Respiratory: Diminished breath sounds bilaterally.  No crackles, wheezing or rales Cardiovascular: RRR, -M/R/G, no JVD Extremities:-Edema,-tenderness Neuro: AAO x4, CNII-XII grossly intact Psych: Normal mood, normal affect  Data Reviewed:  Imaging: CT Chest 10/23/15 (report only) - Centrilobular emphysema. 61mm lingular nodule improved compared to prior imaging CT Chest 08/01/19 - Diffuse moderate centrilobular emphysema. Left lingular atelectasis  PFT: 11/14/15 FVC 2.0 (48%) FEV1 1.0 (33%) Ratio 49  Interpretation: Very severe obstructive defect present  01/17/21 FVC 2.71 (68%) FEV1 1.34 (48%) Ratio 48  TLC 103% DLCO 47% Interpretation: Severe obstructive defect with air trapping and moderately reduced DLCO consistent with emphysema. Significant bronchodilator response in FEV1 and FVC  Labs: Absolute eos 04/13/19 - 200  Ambulatory O2 01/17/21 Patient Saturations on Room Air at Rest = 98% Patient Saturations on Room Air while Ambulating = 75% Patient Saturations on 3 Liters of oxygen while Ambulating = 91% Comments: Physician (myself) re-walked patient on 3L O2 POC with no desaturation on 2 laps at brisk pace. On lap 3 patient report feeling breathless and desatted to 80%. With rest recovered to 93% in less than one minute. Of note waveform not optimal during ambulation however no desaturations below 91% on this walk.    Assessment & Plan:    Discussion: 86 year old male with chronic hypoxemic respiratory failure secondary to COPD, probable pulmonary HTN (WHO III) and chronic diastolic heart failure who presents for follow-up. Although active he is having uncontrolled symptoms and intermittent hypoxemia. In-office he had significant desaturations to 75% on his initial ambulatory O2 requiring 15L O2 for recovery. Suspected aberrant readings due to poor waveform readings so repeat ambulatory walk was performed when patient was deemed stable. Repeat ambulatory O2 documented above with SpO2 >91% on 3L POC.   I suspect that his COPD is progressing. We discussed goals of care and that his prognosis would likely poor in the event of respiratory illness with his baseline lung function and co-morbidities. Wife present and acknowledged this had not been discussed.  End-stage COPD - symptomatic CONTINUE Spiriva 2.5 mcg TWO puffs ONCE a day. REFILL CONTINUE Symbicort 160-4.5 mcg TWO puffs TWICE a day. REFILL CONTINUE Proair AS NEEDED for shortness of breath and wheezing CONTINUE maintenance exercise 3-5  times a week ARRANGE for pulmonary function tests Discussed goals of care including wish for CPR and ventilator. Please continue discussions at home   Chronic hypoxemic respiratory failure secondary to COPD Intermittent confusion CONTINUE supplemental oxygen to maintain levels >88% with activity and sleep  Nightmares/Activity --Call if you wish for sleep referral  Health Maintenance Immunization History  Administered Date(s) Administered   DTaP 06/30/2011   Influenza Nasal 12/21/2018   Influenza Split 01/01/2015   Influenza, High Dose Seasonal PF 01/09/2016, 01/12/2018, 01/12/2018, 12/26/2019   Influenza-Unspecified 12/22/2012, 12/22/2013, 12/22/2017   PFIZER(Purple Top)SARS-COV-2 Vaccination 04/05/2019, 05/06/2019, 02/01/2020   Pneumococcal Conjugate-13 09/28/2013   Pneumococcal Polysaccharide-23 05/30/2010   CT Lung Screen - not  qualified  No orders of the defined types were placed in this encounter.  Meds ordered this encounter  Medications   Tiotropium Bromide Monohydrate (SPIRIVA RESPIMAT) 2.5 MCG/ACT AERS    Sig: Inhale 2 puffs into the lungs daily.    Dispense:  4 g    Refill:  6    Order Specific Question:   Lot Number?    Answer:   [id number]    Order Specific Question:   Expiration Date?    Answer:   12/21/2020    Order Specific Question:   Quantity    Answer:   4   budesonide-formoterol (SYMBICORT) 160-4.5 MCG/ACT inhaler    Sig: Inhale 2 puffs into the lungs 2 (two) times daily.    Dispense:  6 g    Refill:  6    Order Specific Question:   Lot Number?    Answer:   [id number]    Order Specific Question:   Expiration Date?    Answer:   09/21/2016    Order Specific Question:   Manufacturer?    Answer:   [place] [[id number]]    Order Specific Question:   Quantity    Answer:   1   Return in about 3 months (around 04/19/2021).  I have spent a total time of 50-minutes on the day of the appointment reviewing prior documentation, coordinating care and discussing medical diagnosis and plan with the patient/family. Past medical history, allergies, medications were reviewed. Pertinent imaging, labs and tests included in this note have been reviewed and interpreted independently by me.  [place], MD [facility] 01/17/2021 1:46 PM  Office Number [phone]

## 2021-01-17 NOTE — Patient Instructions (Addendum)
Very severe COPD CONTINUE Spiriva 2.5 mcg TWO puffs ONCE a day. REFILL CONTINUE Symbicort 160-4.5 mcg TWO puffs TWICE a day. REFILL CONTINUE Proair AS NEEDED for shortness of breath and wheezing CONTINUE maintenance exercise 3-5 times a week ARRANGE for pulmonary function tests Discussed goals of care including wish for CPR and ventilator. Please continue discussions at home   Chronic hypoxemic respiratory failure secondary to COPD Intermittent confusion CONTINUE supplemental oxygen to maintain levels >88% with activity and sleep  Nightmares/Activity --Call if you wish for sleep referral   Follow-up with me in 3 months with PFTs before visit

## 2021-02-19 DIAGNOSIS — R351 Nocturia: Secondary | ICD-10-CM | POA: Diagnosis not present

## 2021-02-19 DIAGNOSIS — R972 Elevated prostate specific antigen [PSA]: Secondary | ICD-10-CM | POA: Diagnosis not present

## 2021-02-19 DIAGNOSIS — N401 Enlarged prostate with lower urinary tract symptoms: Secondary | ICD-10-CM | POA: Diagnosis not present

## 2021-03-21 DIAGNOSIS — K5904 Chronic idiopathic constipation: Secondary | ICD-10-CM | POA: Diagnosis not present

## 2021-03-21 DIAGNOSIS — J9611 Chronic respiratory failure with hypoxia: Secondary | ICD-10-CM | POA: Diagnosis not present

## 2021-03-21 DIAGNOSIS — I10 Essential (primary) hypertension: Secondary | ICD-10-CM | POA: Diagnosis not present

## 2021-03-21 DIAGNOSIS — Z9981 Dependence on supplemental oxygen: Secondary | ICD-10-CM | POA: Diagnosis not present

## 2021-04-08 ENCOUNTER — Ambulatory Visit (INDEPENDENT_AMBULATORY_CARE_PROVIDER_SITE_OTHER): Payer: Medicare Other | Admitting: Pulmonary Disease

## 2021-04-08 ENCOUNTER — Other Ambulatory Visit: Payer: Self-pay

## 2021-04-08 ENCOUNTER — Encounter: Payer: Self-pay | Admitting: Pulmonary Disease

## 2021-04-08 VITALS — BP 152/62 | HR 58 | Temp 98.1°F | Ht 70.0 in | Wt 202.4 lb

## 2021-04-08 DIAGNOSIS — J449 Chronic obstructive pulmonary disease, unspecified: Secondary | ICD-10-CM

## 2021-04-08 DIAGNOSIS — R0902 Hypoxemia: Secondary | ICD-10-CM | POA: Diagnosis not present

## 2021-04-08 DIAGNOSIS — J9611 Chronic respiratory failure with hypoxia: Secondary | ICD-10-CM

## 2021-04-08 LAB — PULMONARY FUNCTION TEST
DL/VA % pred: 112 %
DL/VA: 4.3 ml/min/mmHg/L
DLCO cor % pred: 76 %
DLCO cor: 18.29 ml/min/mmHg
DLCO unc % pred: 42 %
DLCO unc: 10.03 ml/min/mmHg
FEF 25-75 Post: 0.61 L/sec
FEF 25-75 Pre: 0.44 L/sec
FEF2575-%Change-Post: 37 %
FEF2575-%Pred-Post: 34 %
FEF2575-%Pred-Pre: 24 %
FEV1-%Change-Post: 14 %
FEV1-%Pred-Post: 46 %
FEV1-%Pred-Pre: 40 %
FEV1-Post: 1.26 L
FEV1-Pre: 1.1 L
FEV1FVC-%Change-Post: 3 %
FEV1FVC-%Pred-Pre: 64 %
FEV6-%Change-Post: 10 %
FEV6-%Pred-Post: 70 %
FEV6-%Pred-Pre: 63 %
FEV6-Post: 2.52 L
FEV6-Pre: 2.28 L
FEV6FVC-%Change-Post: 0 %
FEV6FVC-%Pred-Post: 102 %
FEV6FVC-%Pred-Pre: 102 %
FVC-%Change-Post: 10 %
FVC-%Pred-Post: 68 %
FVC-%Pred-Pre: 62 %
FVC-Post: 2.65 L
FVC-Pre: 2.39 L
Post FEV1/FVC ratio: 47 %
Post FEV6/FVC ratio: 95 %
Pre FEV1/FVC ratio: 46 %
Pre FEV6/FVC Ratio: 95 %
RV % pred: 175 %
RV: 4.8 L
TLC % pred: 107 %
TLC: 7.6 L

## 2021-04-08 MED ORDER — IPRATROPIUM-ALBUTEROL 0.5-2.5 (3) MG/3ML IN SOLN: 3.0000 mL | Freq: Four times a day (QID) | RESPIRATORY_TRACT | 3 refills | Status: DC | PRN

## 2021-04-08 NOTE — Patient Instructions (Addendum)
End-stage COPD - symptomatic START Duonebs q6h AS NEEDED for shortness of breath or wheezing ARRANGE for nebulizer CONTINUE Spiriva 2.5 mcg TWO puffs ONCE a day. REFILL CONTINUE Symbicort 160-4.5 mcg TWO puffs TWICE a day. REFILL CONTINUE Proair AS NEEDED for shortness of breath and wheezing CONTINUE maintenance exercise 3-5 times a week   Chronic hypoxemic respiratory failure secondary to COPD Intermittent confusion CONTINUE 3L pulsed supplemental oxygen to maintain levels >88% with activity and sleep  Follow-up with me in April 2023

## 2021-04-08 NOTE — Progress Notes (Signed)
PFT done today. 

## 2021-04-08 NOTE — Progress Notes (Signed)
Subjective:   PATIENT ID: Mike Mclaughlin GENDER: male DOB: [birth year], MRN: [id number]   HPI  Chief Complaint  Patient presents with   Follow-up    Breathing issues at night   Reason for Visit: Follow-up   Mike Mclaughlin is a 87 year old male with chronic hypoxemic respiratory failure secondary to COPD, bradycardia, probable pulmonary hypertension and chronic diastolic heart failure who presents for follow-up.  Synopsis: 2021 - Established care with [facility]. Started on Spiriva in January. Compliant on both Symbicort and Spiriva. On home oxygen nightly. Completed Pulmonary Rehab in [place] 2022 - Continued maintenance rehab program. Started requiring O2 with activity. Had [place] in July  08/13/20 Since our our last visit he is continuing maintenance program three times a week including 20-30 minutes on tread mill and upper body exercises . He is compliant with Symbicort or Spiriva. He uses his rescue inhaler at least 1-2 times a day.  He has difficulty when walking on inclines.  He reports using more oxygen at night overnight due to shortness of breath. His wife reports episodes of confusion that self resolve. Unclear if this is related to low oxygen levels which his wife reports has dropped to 79% when he sits up.   01/17/21 Since our last visit he was completed rehab and is now participating in wellness program three times a week. They do not check his oxygen but he wears it continuously during exercise. He is compliant with Symbicort and Spiriva. Both he and his wife had COVID-19 in the summer. Wife reports that his oxygen drops on the pulsed oxygen. He wears continuous oxygen at night. Denies coughing and shortness of breath. Has wheezing at night. Wife reports nightmares where he still asleep but can move around violently and hurt himself (scratches arm).   04/08/21 Since our last visit he has been compliant with his Symbicort and Spiriva. His wife provides additional  history that is short of breath with exertion. Also seems to occur at night. Oxygen with drop to mid-80s with moderate to heavy exertion. No wheezing or cough. He remains active with activity including going to church.  Social History: 1/2ppd x 20 years. Quit 2013.  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  Past Medical History:  Diagnosis Date   Arthritis    Cardiac dysrhythmia, unspecified    Chronic pain of right knee 09/04/2017   Chronic respiratory failure with hypoxia 11/14/2015          COPD GOLD III 11/29/2013       DOE (dyspnea on exertion) 10/06/2018   Dyslipidemia    Esophageal reflux    H/O sinus bradycardia    Hypertension    Insomnia, unspecified    Malaise and fatigue 10/06/2018   Medication management 10/13/2019   Mixed hyperlipidemia    Physical deconditioning 07/12/2019   Rheumatism    Sinus bradycardia 09/04/2017   Skin cancer, basal cell    Solitary pulmonary nodule 11/29/2013   L lingula 106mm 10/2013.  Rescan 10/2014>>>no change in nodule,  Final CT 10/19/15 Tia Alert) > no change so no dedicated f/u rec    Status post right hip replacement 11/18/2017      Outpatient Medications Prior to Visit  Medication Sig Dispense Refill   albuterol (PROAIR HFA) 108 (90 Base) MCG/ACT inhaler Inhale 1 puff into the lungs every 6 (six) hours as needed for wheezing or shortness of breath. 18 g 5   aspirin 81 MG tablet Take 81 mg by  mouth daily.     budesonide-formoterol (SYMBICORT) 160-4.5 MCG/ACT inhaler Inhale 2 puffs into the lungs 2 (two) times daily. 6 g 6   omeprazole (PRILOSEC) 20 MG capsule Take 20 mg by mouth daily.     OXYGEN Place 3 L into the nose daily. 2.5 lpm with sleep     Tamsulosin HCl (FLOMAX) 0.4 MG CAPS Take 0.4 mg by mouth daily.      Tiotropium Bromide Monohydrate (SPIRIVA RESPIMAT) 2.5 MCG/ACT AERS Inhale 2 puffs into the lungs daily. 4 g 6   docusate sodium (COLACE) 100 MG capsule Take 100 mg by mouth daily.  (Patient not taking: Reported on 04/08/2021)     ferrous sulfate 325 (65 FE) MG tablet Take 325 mg by mouth daily with breakfast. (Patient not taking: Reported on 04/08/2021)     Tiotropium Bromide Monohydrate (SPIRIVA RESPIMAT) 2.5 MCG/ACT AERS Inhale 2 puffs into the lungs daily. (Patient not taking: Reported on 01/17/2021) 4 g 0   No facility-administered medications prior to visit.    Review of Systems  Constitutional:  Negative for chills, diaphoresis, fever, malaise/fatigue and weight loss.  HENT:  Negative for congestion.   Respiratory:  Positive for shortness of breath. Negative for cough, hemoptysis, sputum production and wheezing.   Cardiovascular:  Negative for chest pain, palpitations and leg swelling.    Objective:   Vitals:   04/08/21 1402  BP: (!) 152/62  Pulse: (!) 58  Temp: 98.1 F (36.7 C)  TempSrc: Oral  SpO2: 99%  Weight: 202 lb 6.4 oz (91.8 kg)  Height: 5\' 10"  (1.778 m)   SpO2: 99 % (3L) O2 Device: Nasal cannula O2 Flow Rate (L/min): 3 L/min O2 Type: Pulse O2  Physical Exam: General: Well-appearing, no acute distress HENT: [place], AT Eyes: EOMI, no scleral icterus Respiratory: Diminished breath sounds bilaterally.  No crackles, wheezing or rales Cardiovascular: RRR, -M/R/G, no JVD Extremities:-Edema,-tenderness Neuro: AAO x4, CNII-XII grossly intact Psych: Normal mood, normal affect  Data Reviewed:  Imaging: CT Chest 10/23/15 (report only) - Centrilobular emphysema. 80mm lingular nodule improved compared to prior imaging CT Chest 08/01/19 - Diffuse moderate centrilobular emphysema. Left lingular atelectasis  PFT: 11/14/15 FVC 2.0 (48%) FEV1 1.0 (33%) Ratio 49  Interpretation: Very severe obstructive defect present  01/17/21 FVC 2.71 (68%) FEV1 1.34 (48%) Ratio 48  TLC 103% DLCO 47% Interpretation: Severe obstructive defect with air trapping and moderately reduced DLCO consistent with emphysema. Significant bronchodilator response in FEV1 and  FVC  04/08/21 FVC 2.65 (68%) FEV1 1.26 (46%) Ratio 46  TLC 107% RV 175% DLCO 42% Interpretation: Severe obstructive defect with air trapping and moderately reduced DLCO consistent with emphysema. Significant bronchodilator response with FEV1  Labs: CBC    Component Value Date/Time   WBC 5.6 04/13/2019 1502   RBC 4.43 04/13/2019 1502   HGB 11.8 (L) 04/13/2019 1502   HCT 37.8 (L) 04/13/2019 1502   PLT 219.0 04/13/2019 1502   MCV 85.3 04/13/2019 1502   MCHC 31.4 04/13/2019 1502   RDW 14.2 04/13/2019 1502   LYMPHSABS 1.4 04/13/2019 1502   MONOABS 0.6 04/13/2019 1502   EOSABS 0.2 04/13/2019 1502   BASOSABS 0.1 04/13/2019 1502   Absolute eos 04/13/19 - 200  Ambulatory O2 04/08/21 Patient Saturations on Room Air at Rest = 97% Patient Saturations on Room Air while Ambulating = 83% Patient Saturations on 3 Liters of oxygen while Ambulating = 94%    Assessment & Plan:   Discussion: 87 year old male with chronic hypoxemic respiratory failure  secondary to COPD, probable pulmonary HTN (WHO III) and chronic diastolic heart failure who presents for follow-up.  Remains active with ambulatory desaturations related to his progressive COPD +/- probable PH. We discussed optimizing bronchodilator management and will add nebulizer to his current regimen. We have previously discussed goals of care and that his prognosis would likely poor in the event of respiratory illness with his baseline lung function and co-morbidities.   End-stage COPD - persistent symptoms however does not limit activity START Duonebs q6h AS NEEDED for shortness of breath or wheezing ARRANGE for nebulizer CONTINUE Spiriva 2.5 mcg TWO puffs ONCE a day. REFILL CONTINUE Symbicort 160-4.5 mcg TWO puffs TWICE a day. REFILL CONTINUE Proair AS NEEDED for shortness of breath and wheezing CONTINUE maintenance exercise 3-5 times a week   Chronic hypoxemic respiratory failure secondary to COPD Intermittent confusion CONTINUE 3L  pulsed supplemental oxygen to maintain levels >88% with activity and sleep  GOC Discussed goals of care including wish for CPR and ventilator. Please continue discussions at home  Nightmares/Activity --Call if you wish for sleep referral  Health Maintenance Immunization History  Administered Date(s) Administered   DTaP 06/30/2011   Fluad Quad(high Dose 65+) 01/03/2021   Influenza Nasal 12/21/2018   Influenza Split 01/01/2015   Influenza, High Dose Seasonal PF 01/09/2016, 01/12/2018, 01/12/2018, 12/26/2019   Influenza-Unspecified 12/22/2012, 12/22/2013, 12/22/2017   PFIZER(Purple Top)SARS-COV-2 Vaccination 04/05/2019, 05/06/2019, 02/01/2020   Pneumococcal Conjugate-13 09/28/2013   Pneumococcal Polysaccharide-23 05/30/2010   CT Lung Screen - not qualified  Orders Placed This Encounter  Procedures   Ambulatory Referral for DME    Referral Priority:   Routine    Referral Type:   Durable Medical Equipment Purchase    Number of Visits Requested:   1   Meds ordered this encounter  Medications   ipratropium-albuterol (DUONEB) 0.5-2.5 (3) MG/3ML SOLN    Sig: Take 3 mLs by nebulization every 6 (six) hours as needed.    Dispense:  360 mL    Refill:  3   Return in about 3 months (around 07/07/2021).  I have spent a total time of 39-minutes on the day of the appointment reviewing prior documentation, coordinating care and discussing medical diagnosis and plan with the patient/family. Past medical history, allergies, medications were reviewed. Pertinent imaging, labs and tests included in this note have been reviewed and interpreted independently by me.  [place], MD [facility] 04/08/2021 3:03 PM  Office Number [phone]

## 2021-04-09 ENCOUNTER — Other Ambulatory Visit: Payer: Self-pay | Admitting: *Deleted

## 2021-04-09 MED ORDER — IPRATROPIUM-ALBUTEROL 0.5-2.5 (3) MG/3ML IN SOLN: 3.0000 mL | Freq: Four times a day (QID) | RESPIRATORY_TRACT | 3 refills | Status: DC | PRN

## 2021-04-15 ENCOUNTER — Telehealth: Payer: Self-pay | Admitting: Pulmonary Disease

## 2021-04-16 ENCOUNTER — Other Ambulatory Visit: Payer: Self-pay

## 2021-04-16 DIAGNOSIS — J449 Chronic obstructive pulmonary disease, unspecified: Secondary | ICD-10-CM

## 2021-04-16 DIAGNOSIS — J9611 Chronic respiratory failure with hypoxia: Secondary | ICD-10-CM

## 2021-04-16 NOTE — Telephone Encounter (Signed)
Called patient to confirm the location of where he wanted the medication sent to.   [facility]   Nothing further needed at this time

## 2021-06-24 ENCOUNTER — Encounter: Payer: Self-pay | Admitting: Pulmonary Disease

## 2021-06-24 ENCOUNTER — Ambulatory Visit (INDEPENDENT_AMBULATORY_CARE_PROVIDER_SITE_OTHER): Payer: Medicare Other | Admitting: Pulmonary Disease

## 2021-06-24 VITALS — BP 130/60 | HR 71 | Temp 98.1°F | Ht 70.0 in | Wt 198.8 lb

## 2021-06-24 DIAGNOSIS — J449 Chronic obstructive pulmonary disease, unspecified: Secondary | ICD-10-CM

## 2021-06-24 DIAGNOSIS — J441 Chronic obstructive pulmonary disease with (acute) exacerbation: Secondary | ICD-10-CM

## 2021-06-24 MED ORDER — PREDNISONE 10 MG PO TABS: 40.0000 mg | ORAL_TABLET | Freq: Every day | ORAL | 0 refills | Status: AC

## 2021-06-24 NOTE — Progress Notes (Signed)
? ? ?Subjective:  ? ?PATIENT ID: Mike Mclaughlin GENDER: male DOB: [birth year], MRN: [id number] ? ? ?HPI ? ?Chief Complaint  ?Patient presents with  ? Follow-up  ?  More sob in the past week.  Having more sob at night, having to use nebulizer during the night.  ? ?Reason for Visit: Follow-up  ? ?[name] is a 87 year old male with chronic hypoxemic respiratory failure secondary to COPD, bradycardia, probable pulmonary hypertension and chronic diastolic heart failure who presents for follow-up. ? ?Synopsis: ?2021 - Established care with [facility]. Started on Spiriva in January. Compliant on both Symbicort and Spiriva. On home oxygen nightly. Completed Pulmonary Rehab in [place] ?2022 - Continued maintenance rehab program. Started requiring O2 with activity. Had [place] in July ? ?08/13/20 ?Since our our last visit he is continuing maintenance program three times a week including 20-30 minutes on tread mill and upper body exercises . He is compliant with Symbicort or Spiriva. He uses his rescue inhaler at least 1-2 times a day.  He has difficulty when walking on inclines.  He reports using more oxygen at night overnight due to shortness of breath. His wife reports episodes of confusion that self resolve. Unclear if this is related to low oxygen levels which his wife reports has dropped to 79% when he sits up.  ? ?01/17/21 ?Since our last visit he was completed rehab and is now participating in wellness program three times a week. They do not check his oxygen but he wears it continuously during exercise. He is compliant with Symbicort and Spiriva. Both he and his wife had COVID-19 in the summer. Wife reports that his oxygen drops on the pulsed oxygen. He wears continuous oxygen at night. Denies coughing and shortness of breath. Has wheezing at night. Wife reports nightmares where he still asleep but can move around violently and hurt himself (scratches arm).  ? ?04/08/21 ?Since our last visit he has been  compliant with his Symbicort and Spiriva. His wife provides additional history that is short of breath with exertion. Also seems to occur at night. Oxygen with drop to mid-80s with moderate to heavy exertion. No wheezing or cough. He remains active with activity including going to church. ? ?06/24/21 ?He reports in the last week needing his duonebs nightly. He reports some cough and congestion. Denies wheezing. Denies any recent triggers. He wears oxygen at night but reports feeling short of breath more than usual. Oxygen in the morning can be in the 70s  but will improve after a few minutes. Compliant with his inhalers. ? ?Social History: ?1/2ppd x 20 years. Quit 2013. ? 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 ? ?Past Medical History:  ?Diagnosis Date  ? Arthritis   ? Cardiac dysrhythmia, unspecified   ? Chronic pain of right knee 09/04/2017  ? Chronic respiratory failure with hypoxia 11/14/2015  ?       ? COPD GOLD III 11/29/2013  ?    ? DOE (dyspnea on exertion) 10/06/2018  ? Dyslipidemia   ? Esophageal reflux   ? H/O sinus bradycardia   ? Hypertension   ? Insomnia, unspecified   ? Malaise and fatigue 10/06/2018  ? Medication management 10/13/2019  ? Mixed hyperlipidemia   ? Physical deconditioning 07/12/2019  ? Rheumatism   ? Sinus bradycardia 09/04/2017  ? Skin cancer, basal cell   ? Solitary pulmonary nodule 11/29/2013  ? L lingula 45m 10/2013.  Rescan 10/2014>>>no change in nodule,  Final CT 10/19/15 (  [place]) > no change so no dedicated f/u rec   ? Status post right hip replacement 11/18/2017  ?  ? ? ?Outpatient Medications Prior to Visit  ?Medication Sig Dispense Refill  ? albuterol (PROAIR HFA) 108 (90 Base) MCG/ACT inhaler Inhale 1 puff into the lungs every 6 (six) hours as needed for wheezing or shortness of breath. 18 g 5  ? aspirin 81 MG tablet Take 81 mg by mouth daily.    ? budesonide-formoterol (SYMBICORT) 160-4.5 MCG/ACT inhaler Inhale 2 puffs into the lungs 2 (two)  times daily. 6 g 6  ? ipratropium-albuterol (DUONEB) 0.5-2.5 (3) MG/3ML SOLN Take 3 mLs by nebulization every 6 (six) hours as needed. 360 mL 3  ? omeprazole (PRILOSEC) 20 MG capsule Take 20 mg by mouth daily.    ? OXYGEN Place 3 L into the nose daily. 2.5 lpm with sleep    ? Tamsulosin HCl (FLOMAX) 0.4 MG CAPS Take 0.4 mg by mouth daily.     ? Tiotropium Bromide Monohydrate (SPIRIVA RESPIMAT) 2.5 MCG/ACT AERS Inhale 2 puffs into the lungs daily. 4 g 6  ? docusate sodium (COLACE) 100 MG capsule Take 100 mg by mouth daily. (Patient not taking: Reported on 04/08/2021)    ? ferrous sulfate 325 (65 FE) MG tablet Take 325 mg by mouth daily with breakfast. (Patient not taking: Reported on 04/08/2021)    ? ?No facility-administered medications prior to visit.  ? ? ?Review of Systems  ?Constitutional:  Negative for chills, diaphoresis, fever, malaise/fatigue and weight loss.  ?HENT:  Positive for congestion.   ?Respiratory:  Positive for shortness of breath. Negative for cough, hemoptysis, sputum production and wheezing.   ?Cardiovascular:  Negative for chest pain, palpitations and leg swelling.  ? ? ?Objective:  ? ?Vitals:  ? 06/24/21 1356  ?BP: 130/60  ?Pulse: 71  ?Temp: 98.1 ?F (36.7 ?C)  ?TempSrc: Oral  ?SpO2: 97%  ?Weight: 198 lb 12.8 oz (90.2 kg)  ?Height: '5\' 10"'$  (1.778 m)  ? ?SpO2: 97 % ?O2 Device: None (Room air) ? ?Physical Exam: ?General: Well-appearing, no acute distress ?HENT: [place], AT ?Eyes: EOMI, no scleral icterus ?Respiratory: Diminished breath sounds to auscultation bilaterally.  No crackles, wheezing or rales ?Cardiovascular: RRR, -M/R/G, no JVD ?Extremities:-Edema,-tenderness ?Neuro: AAO x4, CNII-XII grossly intact ?Psych: Normal mood, normal affect ? ? ?Data Reviewed: ? ?Imaging: ?CT Chest 10/23/15 (report only) - Centrilobular emphysema. 57m lingular nodule improved compared to prior imaging ?CT Chest 08/01/19 - Diffuse moderate centrilobular emphysema. Left lingular atelectasis ? ?PFT: ?11/14/15 ?FVC 2.0 (48%)  FEV1 1.0 (33%) Ratio 49  ?Interpretation: Very severe obstructive defect present ? ?01/17/21 ?FVC 2.71 (68%) FEV1 1.34 (48%) Ratio 48  TLC 103% DLCO 47% ?Interpretation: Severe obstructive defect with air trapping and moderately reduced DLCO consistent with emphysema. Significant bronchodilator response in FEV1 and FVC ? ?04/08/21 ?FVC 2.65 (68%) FEV1 1.26 (46%) Ratio 46  TLC 107% RV 175% DLCO 42% ?Interpretation: Severe obstructive defect with air trapping and moderately reduced DLCO consistent with emphysema. Significant bronchodilator response with FEV1 ? ?Labs: ?CBC ?   ?Component Value Date/Time  ? WBC 5.6 04/13/2019 1502  ? RBC 4.43 04/13/2019 1502  ? HGB 11.8 (L) 04/13/2019 1502  ? HCT 37.8 (L) 04/13/2019 1502  ? PLT 219.0 04/13/2019 1502  ? MCV 85.3 04/13/2019 1502  ? MCHC 31.4 04/13/2019 1502  ? RDW 14.2 04/13/2019 1502  ? LYMPHSABS 1.4 04/13/2019 1502  ? MONOABS 0.6 04/13/2019 1502  ? EOSABS 0.2 04/13/2019 1502  ? BASOSABS  0.1 04/13/2019 1502  ? ?Absolute eos 04/13/19 - 200 ? ?Ambulatory O2 04/08/21 ?Patient Saturations on Room Air at Rest = 97% ?Patient Saturations on Room Air while Ambulating = 83% ?Patient Saturations on 3 Liters of oxygen while Ambulating = 94% ?   ?Assessment & Plan:  ? ?Discussion: ?87 year old male with chronic hypoxemic respiratory failure secondary to COPD, probable pulmonary HTN (WHO III) and chronic diastolic heart failure who presents for follow-up.  Discussed clinical course and management of COPD including bronchodilator regimen and action plan for exacerbation. Ambulatory O2 stable this visit. ? ?End-stage COPD ?COPD exacerbation ?Prednisone 40 mg x 5 days ?CONTINUE Duonebs q6h AS NEEDED for shortness of breath or wheezing ?CONTINUE Spiriva 2.5 mcg TWO puffs ONCE a day.  ?CONTINUE Symbicort 160-4.5 mcg TWO puffs TWICE a day.  ?CONTINUE Proair AS NEEDED for shortness of breath and wheezing ?CONTINUE maintenance exercise 3-5 times a week ?  ?Chronic hypoxemic respiratory failure  secondary to COPD ?Intermittent confusion ?CONTINUE 3L pulsed supplemental oxygen to maintain levels >88% with activity and sleep ? ?[place] ?Discussed goals of care including wish for CPR and ventilator. Ple

## 2021-06-24 NOTE — Patient Instructions (Addendum)
?  End-stage COPD ?COPD exacerbation ?Prednisone 40 mg x 5 days ?CONTINUE Duonebs q6h AS NEEDED for shortness of breath or wheezing ?CONTINUE Spiriva 2.5 mcg TWO puffs ONCE a day.  ?CONTINUE Symbicort 160-4.5 mcg TWO puffs TWICE a day.  ?CONTINUE Proair AS NEEDED for shortness of breath and wheezing ?CONTINUE maintenance exercise 3-5 times a week ? ?Follow-up with me in 6 months ?

## 2021-07-29 ENCOUNTER — Ambulatory Visit (INDEPENDENT_AMBULATORY_CARE_PROVIDER_SITE_OTHER): Payer: Medicare Other | Admitting: Cardiology

## 2021-07-29 ENCOUNTER — Ambulatory Visit (INDEPENDENT_AMBULATORY_CARE_PROVIDER_SITE_OTHER): Payer: Medicare Other

## 2021-07-29 ENCOUNTER — Encounter: Payer: Self-pay | Admitting: Cardiology

## 2021-07-29 VITALS — BP 136/76 | HR 55 | Ht 70.0 in | Wt 197.0 lb

## 2021-07-29 DIAGNOSIS — R0602 Shortness of breath: Secondary | ICD-10-CM | POA: Diagnosis not present

## 2021-07-29 DIAGNOSIS — R001 Bradycardia, unspecified: Secondary | ICD-10-CM | POA: Diagnosis not present

## 2021-07-29 DIAGNOSIS — R42 Dizziness and giddiness: Secondary | ICD-10-CM | POA: Diagnosis not present

## 2021-07-29 DIAGNOSIS — E782 Mixed hyperlipidemia: Secondary | ICD-10-CM | POA: Diagnosis not present

## 2021-07-29 DIAGNOSIS — I10 Essential (primary) hypertension: Secondary | ICD-10-CM

## 2021-07-29 NOTE — Patient Instructions (Addendum)
Medication Instructions:  Your physician recommends that you continue on your current medications as directed. Please refer to the Current Medication list given to you today.  *If you need a refill on your cardiac medications before your next appointment, please call your pharmacy*   Lab Work: None If you have labs (blood work) drawn today and your tests are completely normal, you will receive your results only by: MyChart Message (if you have MyChart) OR A paper copy in the mail If you have any lab test that is abnormal or we need to change your treatment, we will call you to review the results.   Testing/Procedures: ZIO XT- Long Term Monitor Instructions  Your physician has requested you wear a ZIO patch monitor for 14 days.  This is a single patch monitor. [employer] supplies one patch monitor per enrollment. Additional stickers are not available. Please do not apply patch if you will be having a Nuclear Stress Test,  Echocardiogram, Cardiac CT, MRI, or Chest Xray during the period you would be wearing the  monitor. The patch cannot be worn during these tests. You cannot remove and re-apply the  ZIO XT patch monitor.  Your ZIO patch monitor will be mailed 3 day USPS to your address on file. It may take 3-5 days  to receive your monitor after you have been enrolled.  Once you have received your monitor, please review the enclosed instructions. Your monitor  has already been registered assigning a specific monitor serial # to you.  Billing and Patient Assistance Program Information  We have supplied [employer] with any of your insurance information on file for billing purposes. [employer] offers a sliding scale Patient Assistance Program for patients that do not have  insurance, or whose insurance does not completely cover the cost of the ZIO monitor.  You must apply for the Patient Assistance Program to qualify for this discounted rate.  To apply, please call [employer] at [phone], select  option 4, select option 2, ask to apply for  Patient Assistance Program. [employer] will ask your household income, and how many people  are in your household. They will quote your out-of-pocket cost based on that information.  [employer] will also be able to set up a 12-month, interest-free payment plan if needed.  Applying the monitor   Shave hair from upper left chest.  Hold abrader disc by orange tab. Rub abrader in 40 strokes over the upper left chest as  indicated in your monitor instructions.  Clean area with 4 enclosed alcohol pads. Let dry.  Apply patch as indicated in monitor instructions. Patch will be placed under collarbone on left  side of chest with arrow pointing upward.  Rub patch adhesive wings for 2 minutes. Remove [place] label marked "1". Remove the [place]  label marked "2". Rub patch adhesive wings for 2 additional minutes.  While looking in a mirror, press and release button in center of patch. A small green light will  flash 3-4 times. This will be your only indicator that the monitor has been turned on.  Do not shower for the first 24 hours. You may shower after the first 24 hours.  Press the button if you feel a symptom. You will hear a small click. Record Date, Time and  Symptom in the Patient Logbook.  When you are ready to remove the patch, follow instructions on the last 2 pages of Patient  Logbook. Stick patch monitor onto the last page of Patient Logbook.  Place Patient Logbook in   the blue and [place] box. Use locking tab on box and tape box closed  securely. The blue and [place] box has prepaid postage on it. Please place it in the mailbox as  soon as possible. Your physician should have your test results approximately 7 days after the  monitor has been mailed back to [employer].  Call [employer] Customer Care at [phone] if you have questions regarding  your ZIO XT patch monitor. Call them immediately if you see an orange light blinking on your  monitor.   If your monitor falls off in less than 4 days, contact our Monitor department at [phone].  If your monitor becomes loose or falls off after 4 days call [employer] at [phone] for  suggestions on securing your monitor    Follow-Up: At [facility], you and your health needs are our priority.  As part of our continuing mission to provide you with exceptional heart care, we have created designated Provider Care Teams.  These Care Teams include your primary Cardiologist (physician) and Advanced Practice Providers (APPs -  Physician Assistants and Nurse Practitioners) who all work together to provide you with the care you need, when you need it.  We recommend signing up for the patient portal called "MyChart".  Sign up information is provided on this After Visit Summary.  MyChart is used to connect with patients for Virtual Visits (Telemedicine).  Patients are able to view lab/test results, encounter notes, upcoming appointments, etc.  Non-urgent messages can be sent to your provider as well.   To learn more about what you can do with MyChart, go to [email or web address].    Your next appointment:   6 month(s)  The format for your next appointment:   In Person  Provider:   Kardie Tobb, DO     Other Instructions   Important Information About Sugar       

## 2021-07-29 NOTE — Progress Notes (Unsigned)
Enrolled for [employer] to mail a ZIO XT long term holter monitor to the patients address on file.  

## 2021-07-30 NOTE — Progress Notes (Signed)
?Cardiology Office Note:   ? ?Date:  07/30/2021  ? ?ID:  Mike Mclaughlin, DOB 1934/08/29, MRN [id number] ? ?PCP:  Elenore Paddy, NP  ?Cardiologist:  Berniece Salines, DO  ?Electrophysiologist:  None  ? ?Referring MD: Elenore Paddy, NP  ? ?" I am having some lightheadedness at times" ? ?History of Present Illness:   ? ?Mike Mclaughlin is a 87 y.o. male with a hx of COPD, respiratory failure on oxygen, chronic persistent bradycardia, hypertension, hyperlipidemia and pulmonary hypertension. ?  ?The patient was seen on July 06, 2018.  At that time he appeared to be doing well from a cardiovascular standpoint.  He denied any lightheadedness or dizziness at that time. ? ?Since I saw the patient he tells me that he has had some increasing lightheadedness and dizziness.  No chest pain or shortness of breath. ? ?Past Medical History:  ?Diagnosis Date  ? Arthritis   ? Cardiac dysrhythmia, unspecified   ? Chronic pain of right knee 09/04/2017  ? Chronic respiratory failure with hypoxia 11/14/2015  ?       ? COPD GOLD III 11/29/2013  ?    ? DOE (dyspnea on exertion) 10/06/2018  ? Dyslipidemia   ? Esophageal reflux   ? H/O sinus bradycardia   ? Hypertension   ? Insomnia, unspecified   ? Malaise and fatigue 10/06/2018  ? Medication management 10/13/2019  ? Mixed hyperlipidemia   ? Physical deconditioning 07/12/2019  ? Rheumatism   ? Sinus bradycardia 09/04/2017  ? Skin cancer, basal cell   ? Solitary pulmonary nodule 11/29/2013  ? L lingula 29m 10/2013.  Rescan 10/2014>>>no change in nodule,  Final CT 10/19/15 ([facility] > no change so no dedicated f/u rec   ? Status post right hip replacement 11/18/2017  ? ? ?Past Surgical History:  ?Procedure Laterality Date  ? CATARACT EXTRACTION    ? COLONOSCOPY WITH PROPOFOL N/A 08/03/2020  ? Procedure: COLONOSCOPY WITH PROPOFOL;  Surgeon: HCarol Ada MD;  Location: [facility];  Service: Endoscopy;  Laterality: N/A;  ? ESOPHAGOGASTRODUODENOSCOPY N/A 01/30/2017  ? Procedure: ESOPHAGOGASTRODUODENOSCOPY (EGD);   Surgeon: HCarol Ada MD;  [facility];  Service: Endoscopy;  Laterality: N/A;  ? ESOPHAGOGASTRODUODENOSCOPY (EGD) WITH PROPOFOL N/A 08/03/2020  ? Procedure: ESOPHAGOGASTRODUODENOSCOPY (EGD) WITH PROPOFOL;  Surgeon: HCarol Ada MD;  Location: [facility];  Service: Endoscopy;  Laterality: N/A;  ? hemmorrhoid surgery    ? HEMOSTASIS CLIP PLACEMENT  08/03/2020  ? Procedure: HEMOSTASIS CLIP PLACEMENT;  Surgeon: HCarol Ada MD;  Location: [facility];  Service: Endoscopy;;  ? HOT HEMOSTASIS N/A 08/03/2020  ? Procedure: HOT HEMOSTASIS (ARGON PLASMA COAGULATION/BICAP);  Surgeon: HCarol Ada MD;  [facility];  Service: Endoscopy;  Laterality: N/A;  ? ? ?Current Medications: ?Current Meds  ?Medication Sig  ? albuterol (PROAIR HFA) 108 (90 Base) MCG/ACT inhaler Inhale 1 puff into the lungs every 6 (six) hours as needed for wheezing or shortness of breath.  ? aspirin 81 MG tablet Take 81 mg by mouth daily.  ? budesonide-formoterol (SYMBICORT) 160-4.5 MCG/ACT inhaler Inhale 2 puffs into the lungs 2 (two) times daily.  ? ipratropium-albuterol (DUONEB) 0.5-2.5 (3) MG/3ML SOLN Take 3 mLs by nebulization every 6 (six) hours as needed.  ? omeprazole (PRILOSEC) 20 MG capsule Take 20 mg by mouth daily.  ? OXYGEN Place 3 L into the nose daily. 2.5 lpm with sleep  ? Tamsulosin HCl (FLOMAX) 0.4 MG CAPS Take 0.4 mg by mouth daily.   ? Tiotropium Bromide Monohydrate (SPIRIVA RESPIMAT) 2.5  MCG/ACT AERS Inhale 2 puffs into the lungs daily.  ?  ? ?Allergies:   Atorvastatin calcium [atorvastatin], Chantix [varenicline], and Other  ? ?Social History  ? ?Socioeconomic History  ? Marital status: Married  ?  Spouse name: Not on file  ? Number of children: Not on file  ? Years of education: Not on file  ? Highest education level: Not on file  ?Occupational History  ? Not on file  ?Tobacco Use  ? Smoking status: Former  ?  Packs/day: 0.50  ?  Years: 15.00  ?  Pack years: 7.50  ?  Types: Cigarettes  ?  Quit date:  08/29/2013  ?  Years since quitting: 7.9  ? Smokeless tobacco: Never  ?Vaping Use  ? Vaping Use: Never used  ?Substance and Sexual Activity  ? Alcohol use: No  ? Drug use: No  ? Sexual activity: Not on file  ?Other Topics Concern  ? Not on file  ?Social History Narrative  ? Married lives with wife.  ? Retired  ? ?Social Determinants of Health  ? ?Financial Resource Strain: Not on file  ?Food Insecurity: Not on file  ?Transportation Needs: Not on file  ?Physical Activity: Not on file  ?Stress: Not on file  ?Social Connections: Not on file  ?  ? ?Family History: ?The patient's family history includes Cancer in his mother; Heart disease in his sister; Other in his father; Rheumatologic disease in his mother; Stroke in his father. ? ?ROS:   ?Review of Systems  ?Constitution: Reports lightheadedness and dizziness.  Negative for decreased appetite, fever and weight gain.  ?HENT: Negative for congestion, ear discharge, hoarse voice and sore throat.   ?Eyes: Negative for discharge, redness, vision loss in right eye and visual halos.  ?Cardiovascular: Negative for chest pain, dyspnea on exertion, leg swelling, orthopnea and palpitations.  ?Respiratory: Negative for cough, hemoptysis, shortness of breath and snoring.   ?Endocrine: Negative for heat intolerance and polyphagia.  ?Hematologic/Lymphatic: Negative for bleeding problem. Does not bruise/bleed easily.  ?Skin: Negative for flushing, nail changes, rash and suspicious lesions.  ?Musculoskeletal: Negative for arthritis, joint pain, muscle cramps, myalgias, neck pain and stiffness.  ?Gastrointestinal: Negative for abdominal pain, bowel incontinence, diarrhea and excessive appetite.  ?Genitourinary: Negative for decreased libido, genital sores and incomplete emptying.  ?Neurological: Negative for brief paralysis, focal weakness, headaches and loss of balance.  ?Psychiatric/Behavioral: Negative for altered mental status, depression and suicidal ideas.  ?Allergic/Immunologic:  Negative for HIV exposure and persistent infections.  ? ? ?EKGs/Labs/Other Studies Reviewed:   ? ?The following studies were reviewed today: ? ? ?EKG:  The ekg ordered today demonstrates sinus bradycardia heart rate 57 beats a minute. ? ?Transthoracic echocardiogram July 2021 IMPRESSIONS  ? 1. Left ventricular ejection fraction, by estimation, is 55 to 60%. The  ?left ventricle has normal function. The left ventricle has no regional  ?wall motion abnormalities. There is mild concentric left ventricular  ?hypertrophy. Left ventricular diastolic  ?parameters are consistent with Grade I diastolic dysfunction (impaired  ?relaxation). Elevated left ventricular end-diastolic pressure.  ? 2. Right ventricular systolic function is normal. The right ventricular  ?size is normal.  ? 3. Left atrial size was moderately dilated.  ? 4. The mitral valve is normal in structure. No evidence of mitral valve  ?regurgitation. No evidence of mitral stenosis.  ? 5. The aortic valve is tricuspid. Aortic valve regurgitation is mild.  ?Mild aortic valve sclerosis is present, with no evidence of aortic valve  ?stenosis.  ?  6. There is mild dilatation of the ascending aorta measuring 37 mm.  ? 7. Sinus bradycardia 41-49 BPM  ? 8. The inferior vena cava is dilated in size with >50% respiratory  ?variability, suggesting right atrial pressure of 8 mmHg.  ? ?FINDINGS  ? Left Ventricle: Left ventricular ejection fraction, by estimation, is 55  ?to 60%. The left ventricle has normal function. The left ventricle has no  ?regional wall motion abnormalities. The left ventricular internal cavity  ?size was normal in size. There is  ? mild concentric left ventricular hypertrophy. Left ventricular diastolic  ?parameters are consistent with Grade I diastolic dysfunction (impaired  ?relaxation). Elevated left ventricular end-diastolic pressure.  ? ?Right Ventricle: The right ventricular size is normal. No increase in  ?right ventricular wall thickness.  Right ventricular systolic function is  ?normal. The tricuspid regurgitant velocity is 2.90 m/s, and with an  ?assumed right atrial pressure of 8 mmHg,  ?the estimated right ventricular systolic pressure is 41.

## 2021-08-02 DIAGNOSIS — R42 Dizziness and giddiness: Secondary | ICD-10-CM

## 2021-08-14 DIAGNOSIS — E559 Vitamin D deficiency, unspecified: Secondary | ICD-10-CM | POA: Diagnosis not present

## 2021-08-14 DIAGNOSIS — R5381 Other malaise: Secondary | ICD-10-CM | POA: Diagnosis not present

## 2021-08-14 DIAGNOSIS — R634 Abnormal weight loss: Secondary | ICD-10-CM | POA: Diagnosis not present

## 2021-08-14 DIAGNOSIS — J449 Chronic obstructive pulmonary disease, unspecified: Secondary | ICD-10-CM | POA: Diagnosis not present

## 2021-08-14 DIAGNOSIS — R5383 Other fatigue: Secondary | ICD-10-CM | POA: Diagnosis not present

## 2021-08-23 DIAGNOSIS — E538 Deficiency of other specified B group vitamins: Secondary | ICD-10-CM | POA: Diagnosis not present

## 2021-08-23 DIAGNOSIS — R42 Dizziness and giddiness: Secondary | ICD-10-CM | POA: Diagnosis not present

## 2021-08-29 ENCOUNTER — Telehealth: Payer: Self-pay | Admitting: Cardiology

## 2021-08-29 NOTE — Telephone Encounter (Signed)
Spoke with pt wife, aware dr Harriet Masson is not in the office this week and it will be next week before those results are available.

## 2021-08-29 NOTE — Telephone Encounter (Signed)
Patient called wanting results of the heart monitor.  She says if she does not hear from [place] on Friday she will call back on Monday.

## 2021-08-30 DIAGNOSIS — D510 Vitamin B12 deficiency anemia due to intrinsic factor deficiency: Secondary | ICD-10-CM | POA: Diagnosis not present

## 2021-09-06 DIAGNOSIS — D519 Vitamin B12 deficiency anemia, unspecified: Secondary | ICD-10-CM | POA: Diagnosis not present

## 2021-09-13 DIAGNOSIS — D519 Vitamin B12 deficiency anemia, unspecified: Secondary | ICD-10-CM | POA: Diagnosis not present

## 2021-09-23 DIAGNOSIS — J449 Chronic obstructive pulmonary disease, unspecified: Secondary | ICD-10-CM | POA: Diagnosis not present

## 2021-09-23 DIAGNOSIS — J9611 Chronic respiratory failure with hypoxia: Secondary | ICD-10-CM | POA: Diagnosis not present

## 2021-09-23 DIAGNOSIS — Z0000 Encounter for general adult medical examination without abnormal findings: Secondary | ICD-10-CM | POA: Diagnosis not present

## 2021-09-23 DIAGNOSIS — Z9981 Dependence on supplemental oxygen: Secondary | ICD-10-CM | POA: Diagnosis not present

## 2021-10-14 DIAGNOSIS — D510 Vitamin B12 deficiency anemia due to intrinsic factor deficiency: Secondary | ICD-10-CM | POA: Diagnosis not present

## 2021-11-04 ENCOUNTER — Other Ambulatory Visit: Payer: Self-pay | Admitting: Pulmonary Disease

## 2021-11-04 DIAGNOSIS — J449 Chronic obstructive pulmonary disease, unspecified: Secondary | ICD-10-CM

## 2021-11-12 DIAGNOSIS — D519 Vitamin B12 deficiency anemia, unspecified: Secondary | ICD-10-CM | POA: Diagnosis not present

## 2021-12-12 DIAGNOSIS — D519 Vitamin B12 deficiency anemia, unspecified: Secondary | ICD-10-CM | POA: Diagnosis not present

## 2021-12-23 DIAGNOSIS — E559 Vitamin D deficiency, unspecified: Secondary | ICD-10-CM | POA: Diagnosis not present

## 2021-12-23 DIAGNOSIS — J449 Chronic obstructive pulmonary disease, unspecified: Secondary | ICD-10-CM | POA: Diagnosis not present

## 2021-12-23 DIAGNOSIS — N183 Chronic kidney disease, stage 3 unspecified: Secondary | ICD-10-CM | POA: Diagnosis not present

## 2021-12-23 DIAGNOSIS — J9611 Chronic respiratory failure with hypoxia: Secondary | ICD-10-CM | POA: Diagnosis not present

## 2021-12-23 DIAGNOSIS — D519 Vitamin B12 deficiency anemia, unspecified: Secondary | ICD-10-CM | POA: Diagnosis not present

## 2021-12-23 DIAGNOSIS — Z79899 Other long term (current) drug therapy: Secondary | ICD-10-CM | POA: Diagnosis not present

## 2021-12-23 DIAGNOSIS — Z9981 Dependence on supplemental oxygen: Secondary | ICD-10-CM | POA: Diagnosis not present

## 2022-01-06 ENCOUNTER — Encounter: Payer: Self-pay | Admitting: Pulmonary Disease

## 2022-01-06 ENCOUNTER — Telehealth: Payer: Self-pay

## 2022-01-06 ENCOUNTER — Ambulatory Visit (INDEPENDENT_AMBULATORY_CARE_PROVIDER_SITE_OTHER): Payer: Medicare Other | Admitting: Pulmonary Disease

## 2022-01-06 ENCOUNTER — Other Ambulatory Visit (HOSPITAL_COMMUNITY): Payer: Self-pay

## 2022-01-06 VITALS — BP 142/70 | HR 51 | Ht 70.0 in | Wt 201.0 lb

## 2022-01-06 DIAGNOSIS — J9611 Chronic respiratory failure with hypoxia: Secondary | ICD-10-CM

## 2022-01-06 DIAGNOSIS — J449 Chronic obstructive pulmonary disease, unspecified: Secondary | ICD-10-CM | POA: Diagnosis not present

## 2022-01-06 MED ORDER — BUDESONIDE 0.5 MG/2ML IN SUSP
0.5000 mg | Freq: Every day | RESPIRATORY_TRACT | 5 refills | Status: DC
Start: 2022-01-06 — End: 2022-01-09

## 2022-01-06 NOTE — Telephone Encounter (Signed)
Test claim resulted  that Mike Mclaughlin, neb at non-LTC/ICF Plan Exclusion under Part D    Selinda Orion CPhT Rx Patient Advocate

## 2022-01-06 NOTE — Patient Instructions (Addendum)
End-stage COPD COPD exacerbation - improving START Pulmicort nebulizer once a day CONTINUE Duonebs q6h AS NEEDED for shortness of breath or wheezing CONTINUE Spiriva 2.5 mcg TWO puffs ONCE a day.  CONTINUE Symbicort 160-4.5 mcg TWO puffs TWICE a day.  CONTINUE Proair AS NEEDED for shortness of breath and wheezing CONTINUE maintenance exercise 3-5 times a week Discussed vaccinations including influenza, COVID, RSV   Chronic hypoxemic respiratory failure secondary to COPD Intermittent confusion CONTINUE 3L pulsed supplemental oxygen to maintain levels >88% with activity and sleep  Follow-up with me in 3 months

## 2022-01-06 NOTE — Telephone Encounter (Signed)
Dr. Loanne Drilling would like to know if pulmicort for neb machine is covered under part b or d

## 2022-01-06 NOTE — Progress Notes (Unsigned)
Subjective:   PATIENT ID: Mike Mclaughlin GENDER: male DOB: [birth year], MRN: [id number]   HPI  Chief Complaint  Patient presents with   Follow-up    Using neb nightly   Reason for Visit: Follow-up   [name] is a 87 year old male with chronic hypoxemic respiratory failure secondary to COPD, bradycardia, probable pulmonary hypertension and chronic diastolic heart failure who presents for follow-up.  Synopsis: 2021 - Established care with [facility]. Started on Spiriva in January. Compliant on both Symbicort and Spiriva. On home oxygen nightly. Completed Pulmonary Rehab in [place] 2022 - Continued maintenance rehab program. Started requiring O2 with activity. Had [place] in July 2023 - Outpatient COPD exacerbation in Oct. SOB with exertion. On Symbicort and Spiriva and Duonebs TID. Compliant with oxygen with exertion  01/06/22 Since our last visit he was treated with COPD exacerbation last week treated with his PCP. He is walking up nightly with productive cough. He has a right inguinal hernia that prevents from his coughing hard. Compliant with inhalers. Duonebs at least 3 times daily.  Social History: 1/2ppd x 20 years. Quit 2013.  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  Past Medical History:  Diagnosis Date   Arthritis    Cardiac dysrhythmia, unspecified    Chronic pain of right knee 09/04/2017   Chronic respiratory failure with hypoxia 11/14/2015          COPD GOLD III 11/29/2013       DOE (dyspnea on exertion) 10/06/2018   Dyslipidemia    Esophageal reflux    H/O sinus bradycardia    Hypertension    Insomnia, unspecified    Malaise and fatigue 10/06/2018   Medication management 10/13/2019   Mixed hyperlipidemia    Physical deconditioning 07/12/2019   Rheumatism    Sinus bradycardia 09/04/2017   Skin cancer, basal cell    Solitary pulmonary nodule 11/29/2013   L lingula 84m 10/2013.  Rescan 10/2014>>>no change in  nodule,  Final CT 10/19/15 ([facility] > no change so no dedicated f/u rec    Status post right hip replacement 11/18/2017      Outpatient Medications Prior to Visit  Medication Sig Dispense Refill   albuterol (VENTOLIN HFA) 108 (90 Base) MCG/ACT inhaler INHALE 1 PUFF INTO THE LUNGS EVERY 6 HOURS AS NEEDED FOR WHEEZING OR SHORTNESS OF BREATH. 17 each 5   aspirin 81 MG tablet Take 81 mg by mouth daily.     budesonide-formoterol (SYMBICORT) 160-4.5 MCG/ACT inhaler Inhale 2 puffs into the lungs 2 (two) times daily. 6 g 6   ipratropium-albuterol (DUONEB) 0.5-2.5 (3) MG/3ML SOLN Take 3 mLs by nebulization every 6 (six) hours as needed. 360 mL 3   omeprazole (PRILOSEC) 20 MG capsule Take 20 mg by mouth daily.     OXYGEN Place 3 L into the nose daily. 2.5 lpm with sleep     Tamsulosin HCl (FLOMAX) 0.4 MG CAPS Take 0.4 mg by mouth daily.      Tiotropium Bromide Monohydrate (SPIRIVA RESPIMAT) 2.5 MCG/ACT AERS Inhale 2 puffs into the lungs daily. 4 g 6   No facility-administered medications prior to visit.    Review of Systems  Constitutional:  Negative for chills, diaphoresis, fever, malaise/fatigue and weight loss.  HENT:  Negative for congestion.   Respiratory:  Positive for cough and sputum production. Negative for hemoptysis, shortness of breath and wheezing.   Cardiovascular:  Negative for chest pain, palpitations and leg swelling.     Objective:  Vitals:   01/06/22 1312  BP: (!) 142/70  Pulse: (!) 51  SpO2: 96%  Weight: 201 lb (91.2 kg)  Height: '5\' 10"'$  (1.778 m)   SpO2: 96 % (3L pulse) O2 Device: Nasal cannula O2 Flow Rate (L/min): 3 L/min O2 Type: Pulse O2  Physical Exam: General: Elderly, chronically ill-appearing, no acute distress HENT: [place], AT Eyes: EOMI, no scleral icterus Respiratory: Diminished but clear to auscultation bilaterally.  No crackles, wheezing or rales Cardiovascular: RRR, -M/R/G, no JVD Extremities:-Edema,-tenderness Neuro: AAO x4, CNII-XII grossly  intact Psych: Normal mood, normal affect  Data Reviewed:  Imaging: CT Chest 10/23/15 (report only) - Centrilobular emphysema. 60m lingular nodule improved compared to prior imaging CT Chest 08/01/19 - Diffuse moderate centrilobular emphysema. Left lingular atelectasis  PFT: 11/14/15 FVC 2.0 (48%) FEV1 1.0 (33%) Ratio 49  Interpretation: Very severe obstructive defect present  01/17/21 FVC 2.71 (68%) FEV1 1.34 (48%) Ratio 48  TLC 103% DLCO 47% Interpretation: Severe obstructive defect with air trapping and moderately reduced DLCO consistent with emphysema. Significant bronchodilator response in FEV1 and FVC  04/08/21 FVC 2.65 (68%) FEV1 1.26 (46%) Ratio 46  TLC 107% RV 175% DLCO 42% Interpretation: Severe obstructive defect with air trapping and moderately reduced DLCO consistent with emphysema. Significant bronchodilator response with FEV1  Labs: CBC    Component Value Date/Time   WBC 5.6 04/13/2019 1502   RBC 4.43 04/13/2019 1502   HGB 11.8 (L) 04/13/2019 1502   HCT 37.8 (L) 04/13/2019 1502   PLT 219.0 04/13/2019 1502   MCV 85.3 04/13/2019 1502   MCHC 31.4 04/13/2019 1502   RDW 14.2 04/13/2019 1502   LYMPHSABS 1.4 04/13/2019 1502   MONOABS 0.6 04/13/2019 1502   EOSABS 0.2 04/13/2019 1502   BASOSABS 0.1 04/13/2019 1502   Absolute eos 04/13/19 - 200  Ambulatory O2 04/08/21 Patient Saturations on Room Air at Rest = 97% Patient Saturations on Room Air while Ambulating = 83% Patient Saturations on 3 Liters of oxygen while Ambulating = 94%    Assessment & Plan:   Discussion: 87year old male with chronic hypoxemic respiratory failure secondary to COPD, probable pulmonary HTN (WHO III) and chronic diastolic heart failure who presents for follow-up. Recent COPD exacerbation that is resolving. Compliant with triple therapy however needing daily nebulizer support. Discussed clinical course and management of COPD including bronchodilator regimen and action plan for  exacerbation.  End-stage COPD COPD exacerbation - improving START Pulmicort nebulizer once a day CONTINUE Duonebs q6h AS NEEDED for shortness of breath or wheezing CONTINUE Spiriva 2.5 mcg TWO puffs ONCE a day.  CONTINUE Symbicort 160-4.5 mcg TWO puffs TWICE a day.  CONTINUE Proair AS NEEDED for shortness of breath and wheezing CONTINUE maintenance exercise 3-5 times a week Discussed vaccinations including influenza, COVID, RSV   Chronic hypoxemic respiratory failure secondary to COPD Intermittent confusion CONTINUE 3L pulsed supplemental oxygen to maintain levels >88% with activity and sleep  GOC Discussed goals of care including wish for CPR and ventilator. Please continue discussions at home  Hx of Nightmares/Activity --Call if you wish for sleep referral  Health Maintenance Immunization History  Administered Date(s) Administered   DTaP 06/30/2011   Fluad Quad(high Dose 65+) 01/03/2021   Influenza Nasal 12/21/2018   Influenza Split 01/01/2015   Influenza, High Dose Seasonal PF 01/09/2016, 01/12/2018, 01/12/2018, 12/26/2019   Influenza-Unspecified 12/22/2012, 12/22/2013, 12/22/2017   PFIZER(Purple Top)SARS-COV-2 Vaccination 04/05/2019, 05/06/2019, 02/01/2020   Pneumococcal Conjugate-13 09/28/2013   Pneumococcal Polysaccharide-23 05/30/2010   CT Lung Screen - not  qualified  No orders of the defined types were placed in this encounter.  Meds ordered this encounter  Medications   budesonide (PULMICORT) 0.5 MG/2ML nebulizer solution    Sig: Take 2 mLs (0.5 mg total) by nebulization daily.    Dispense:  60 mL    Refill:  5    Use Medicare Part B coverage   Return in about 3 months (around 04/08/2022).  I have spent a total time of 32-minutes on the day of the appointment including chart review, data review, collecting history, coordinating care and discussing medical diagnosis and plan with the patient/family. Past medical history, allergies, medications were reviewed.  Pertinent imaging, labs and tests included in this note have been reviewed and interpreted independently by me.  Vernon Hills, MD [facility] 01/06/2022 1:31 PM  Office Number [phone]

## 2022-01-09 MED ORDER — BUDESONIDE 0.5 MG/2ML IN SUSP: 0.5000 mg | Freq: Every day | RESPIRATORY_TRACT | 5 refills | Status: DC

## 2022-01-09 NOTE — Telephone Encounter (Signed)
I have sent rx to pharm for pulmicort with dx code so they can file under medicare part B

## 2022-01-20 DIAGNOSIS — D510 Vitamin B12 deficiency anemia due to intrinsic factor deficiency: Secondary | ICD-10-CM | POA: Diagnosis not present

## 2022-01-26 ENCOUNTER — Other Ambulatory Visit: Payer: Self-pay | Admitting: Pulmonary Disease

## 2022-01-26 DIAGNOSIS — J449 Chronic obstructive pulmonary disease, unspecified: Secondary | ICD-10-CM

## 2022-01-26 DIAGNOSIS — J9611 Chronic respiratory failure with hypoxia: Secondary | ICD-10-CM

## 2022-01-26 DIAGNOSIS — Z79899 Other long term (current) drug therapy: Secondary | ICD-10-CM

## 2022-01-26 DIAGNOSIS — R5381 Other malaise: Secondary | ICD-10-CM

## 2022-02-03 ENCOUNTER — Encounter: Payer: Self-pay | Admitting: Cardiology

## 2022-02-03 ENCOUNTER — Ambulatory Visit: Payer: Medicare Other | Attending: Cardiology | Admitting: Cardiology

## 2022-02-03 VITALS — BP 144/78 | HR 54 | Ht 70.0 in | Wt 198.4 lb

## 2022-02-03 DIAGNOSIS — I10 Essential (primary) hypertension: Secondary | ICD-10-CM | POA: Diagnosis not present

## 2022-02-03 DIAGNOSIS — E782 Mixed hyperlipidemia: Secondary | ICD-10-CM | POA: Insufficient documentation

## 2022-02-03 NOTE — Patient Instructions (Signed)
Medication Instructions:  Your physician recommends that you continue on your current medications as directed. Please refer to the Current Medication list given to you today.  *If you need a refill on your cardiac medications before your next appointment, please call your pharmacy*   Lab Work: NONE If you have labs (blood work) drawn today and your tests are completely normal, you will receive your results only by: [place] (if you have MyChart) OR A paper copy in the mail If you have any lab test that is abnormal or we need to change your treatment, we will call you to review the results.   Testing/Procedures: NONE   Follow-Up: At [facility], you and your health needs are our priority.  As part of our continuing mission to provide you with exceptional heart care, we have created designated Provider Care Teams.  These Care Teams include your primary Cardiologist (physician) and Advanced Practice Providers (APPs -  Physician Assistants and Nurse Practitioners) who all work together to provide you with the care you need, when you need it.  We recommend signing up for the patient portal called "MyChart".  Sign up information is provided on this After Visit Summary.  MyChart is used to connect with patients for Virtual Visits (Telemedicine).  Patients are able to view lab/test results, encounter notes, upcoming appointments, etc.  Non-urgent messages can be sent to your provider as well.   To learn more about what you can do with MyChart, go to [email or web address].    Your next appointment:   9 month(s)  The format for your next appointment:   In Person  Provider:   Berniece Salines, DO

## 2022-02-03 NOTE — [place] (Signed)
Cardiology Office Note:    Date:  02/03/2022   ID:  Mike Mclaughlin, DOB Jan 18, 1935, MRN [id number]  PCP:  Elenore Paddy, NP  Cardiologist:  Berniece Salines, DO  Electrophysiologist:  None   Referring MD: Elenore Paddy, NP   " I am having some lightheadedness at times"  History of Present Illness:    Mike Mclaughlin is a 87 y.o. male with a hx of COPD, respiratory failure on oxygen, chronic persistent bradycardia, hypertension, hyperlipidemia and pulmonary hypertension.   The patient was seen on July 06, 2018.  At that time he appeared to be doing well from a cardiovascular standpoint.  He denied any lightheadedness or dizziness at that time.  I had his last visit on Jul 29, 2021 I placed a monitor on the patient to make sure sinus node dysfunction was not playing a role here.  His monitor does not show any evidence of significant AV blocks.  Since I saw him he is now on his oxygen for 24 hours.  He has had no ED visit or urgent care visits.  Overall he is at his baseline.  Past Medical History:  Diagnosis Date   Arthritis    Cardiac dysrhythmia, unspecified    Chronic pain of right knee 09/04/2017   Chronic respiratory failure with hypoxia 11/14/2015          COPD GOLD III 11/29/2013       DOE (dyspnea on exertion) 10/06/2018   Dyslipidemia    Esophageal reflux    H/O sinus bradycardia    Hypertension    Insomnia, unspecified    Malaise and fatigue 10/06/2018   Medication management 10/13/2019   Mixed hyperlipidemia    Physical deconditioning 07/12/2019   Rheumatism    Sinus bradycardia 09/04/2017   Skin cancer, basal cell    Solitary pulmonary nodule 11/29/2013   L lingula 18m 10/2013.  Rescan 10/2014>>>no change in nodule,  Final CT 10/19/15 ([facility] Alert > no change so no dedicated f/u rec    Status post right hip replacement 11/18/2017    Past Surgical History:  Procedure Laterality Date   CATARACT EXTRACTION     COLONOSCOPY WITH PROPOFOL N/A 08/03/2020   Procedure: COLONOSCOPY  WITH PROPOFOL;  Surgeon: HCarol Ada MD;  Location: [facility];  Service: Endoscopy;  Laterality: N/A;   ESOPHAGOGASTRODUODENOSCOPY N/A 01/30/2017   Procedure: ESOPHAGOGASTRODUODENOSCOPY (EGD);  Surgeon: HCarol Ada MD;  [facility];  Service: Endoscopy;  Laterality: N/A;   ESOPHAGOGASTRODUODENOSCOPY (EGD) WITH PROPOFOL N/A 08/03/2020   Procedure: ESOPHAGOGASTRODUODENOSCOPY (EGD) WITH PROPOFOL;  Surgeon: HCarol Ada MD;  Location: [facility];  Service: Endoscopy;  Laterality: N/A;   hemmorrhoid surgery     HEMOSTASIS CLIP PLACEMENT  08/03/2020   Procedure: HEMOSTASIS CLIP PLACEMENT;  Surgeon: HCarol Ada MD;  Location: [facility];  Service: Endoscopy;;   HOT HEMOSTASIS N/A 08/03/2020   Procedure: HOT HEMOSTASIS (ARGON PLASMA COAGULATION/BICAP);  Surgeon: HCarol Ada MD;  [facility];  Service: Endoscopy;  Laterality: N/A;    Current Medications: Current Meds  Medication Sig   albuterol (VENTOLIN HFA) 108 (90 Base) MCG/ACT inhaler INHALE 1 PUFF INTO THE LUNGS EVERY 6 HOURS AS NEEDED FOR WHEEZING OR SHORTNESS OF BREATH.   aspirin 81 MG tablet Take 81 mg by mouth daily.   budesonide (PULMICORT) 0.5 MG/2ML nebulizer solution Take 2 mLs (0.5 mg total) by nebulization daily.   budesonide-formoterol (SYMBICORT) 160-4.5 MCG/ACT inhaler Inhale 2 puffs into the lungs 2 (two) times daily.   ipratropium-albuterol (DUONEB) 0.5-2.5 (3)  MG/3ML SOLN Take 3 mLs by nebulization every 6 (six) hours as needed.   omeprazole (PRILOSEC) 20 MG capsule Take 20 mg by mouth daily.   OXYGEN Place 3 L into the nose daily. 2.5 lpm with sleep   Tamsulosin HCl (FLOMAX) 0.4 MG CAPS Take 0.4 mg by mouth daily.    Tiotropium Bromide Monohydrate (SPIRIVA RESPIMAT) 2.5 MCG/ACT AERS INHALE 2 PUFFS BY MOUTH INTO THE LUNGS DAILY     Allergies:   Atorvastatin calcium [atorvastatin], Chantix [varenicline], and Other   Social History   Socioeconomic History   Marital status: Married     Spouse name: Not on file   Number of children: Not on file   Years of education: Not on file   Highest education level: Not on file  Occupational History   Not on file  Tobacco Use   Smoking status: Former    Packs/day: 0.50    Years: 15.00    Total pack years: 7.50    Types: Cigarettes    Quit date: 08/29/2013    Years since quitting: 8.4   Smokeless tobacco: Never  Vaping Use   Vaping Use: Never used  Substance and Sexual Activity   Alcohol use: No   Drug use: No   Sexual activity: Not on file  Other Topics Concern   Not on file  Social History Narrative   Married lives with wife.   Retired   Investment banker, operational of Radio broadcast assistant Strain: Not on [employer] Insecurity: Not on file  Transportation Needs: Not on file  Physical Activity: Not on file  Stress: Not on file  Social Connections: Not on file     Family History: The patient's family history includes Cancer in his mother; Heart disease in his sister; Other in his father; Rheumatologic disease in his mother; Stroke in his father.  ROS:   Review of Systems  Constitution: Reports lightheadedness and dizziness.  Negative for decreased appetite, fever and weight gain.  HENT: Negative for congestion, ear discharge, hoarse voice and sore throat.   Eyes: Negative for discharge, redness, vision loss in right eye and visual halos.  Cardiovascular: Negative for chest pain, dyspnea on exertion, leg swelling, orthopnea and palpitations.  Respiratory: Negative for cough, hemoptysis, shortness of breath and snoring.   Endocrine: Negative for heat intolerance and polyphagia.  Hematologic/Lymphatic: Negative for bleeding problem. Does not bruise/bleed easily.  Skin: Negative for flushing, nail changes, rash and suspicious lesions.  Musculoskeletal: Negative for arthritis, joint pain, muscle cramps, myalgias, neck pain and stiffness.  Gastrointestinal: Negative for abdominal pain, bowel incontinence, diarrhea and  excessive appetite.  Genitourinary: Negative for decreased libido, genital sores and incomplete emptying.  Neurological: Negative for brief paralysis, focal weakness, headaches and loss of balance.  Psychiatric/Behavioral: Negative for altered mental status, depression and suicidal ideas.  Allergic/Immunologic: Negative for HIV exposure and persistent infections.    EKGs/Labs/Other Studies Reviewed:    The following studies were reviewed today:   EKG:  The ekg ordered today demonstrates sinus bradycardia heart rate 57 beats a minute.  Transthoracic echocardiogram July 2021 IMPRESSIONS   1. Left ventricular ejection fraction, by estimation, is 55 to 60%. The  left ventricle has normal function. The left ventricle has no regional  wall motion abnormalities. There is mild concentric left ventricular  hypertrophy. Left ventricular diastolic  parameters are consistent with Grade I diastolic dysfunction (impaired  relaxation). Elevated left ventricular end-diastolic pressure.   2. Right ventricular systolic function is normal. The  right ventricular  size is normal.   3. Left atrial size was moderately dilated.   4. The mitral valve is normal in structure. No evidence of mitral valve  regurgitation. No evidence of mitral stenosis.   5. The aortic valve is tricuspid. Aortic valve regurgitation is mild.  Mild aortic valve sclerosis is present, with no evidence of aortic valve  stenosis.   6. There is mild dilatation of the ascending aorta measuring 37 mm.   7. Sinus bradycardia 41-49 BPM   8. The inferior vena cava is dilated in size with >50% respiratory  variability, suggesting right atrial pressure of 8 mmHg.   FINDINGS   Left Ventricle: Left ventricular ejection fraction, by estimation, is 55  to 60%. The left ventricle has normal function. The left ventricle has no  regional wall motion abnormalities. The left ventricular internal cavity  size was normal in size. There is   mild  concentric left ventricular hypertrophy. Left ventricular diastolic  parameters are consistent with Grade I diastolic dysfunction (impaired  relaxation). Elevated left ventricular end-diastolic pressure.   Right Ventricle: The right ventricular size is normal. No increase in  right ventricular wall thickness. Right ventricular systolic function is  normal. The tricuspid regurgitant velocity is 2.90 m/s, and with an  assumed right atrial pressure of 8 mmHg,  the estimated right ventricular systolic pressure is 83.1 mmHg.   Left Atrium: Left atrial size was moderately dilated.   Right Atrium: Right atrial size was normal in size.   Pericardium: There is no evidence of pericardial effusion.   Mitral Valve: The mitral valve is normal in structure. Normal mobility of  the mitral valve leaflets. No evidence of mitral valve regurgitation. No  evidence of mitral valve stenosis.   Tricuspid Valve: The tricuspid valve is normal in structure. Tricuspid  valve regurgitation is mild . No evidence of tricuspid stenosis.   Aortic Valve: The aortic valve is tricuspid. Aortic valve regurgitation is  mild. Mild aortic valve sclerosis is present, with no evidence of aortic  valve stenosis.   Pulmonic Valve: The pulmonic valve was normal in structure. Pulmonic valve  regurgitation is not visualized. No evidence of pulmonic stenosis.   Aorta: The aortic root is normal in size and structure. There is mild  dilatation of the ascending aorta measuring 37 mm.   Venous: The inferior vena cava is dilated in size with greater than 50%  respiratory variability, suggesting right atrial pressure of 8 mmHg.   IAS/Shunts: No atrial level shunt detected by color flow Doppler.       Recent Labs: No results found for requested labs within last 365 days.  Recent Lipid Panel No results found for: "CHOL", "TRIG", "HDL", "CHOLHDL", "VLDL", "LDLCALC", "LDLDIRECT"  Physical Exam:    VS:  BP (!) 144/78 (BP  Location: Right Arm, Patient Position: Sitting)   Pulse (!) 54   Ht '5\' 10"'$  (1.778 m)   Wt 198 lb 6.4 oz (90 kg)   SpO2 96%   BMI 28.47 kg/m     Wt Readings from Last 3 Encounters:  02/03/22 198 lb 6.4 oz (90 kg)  01/06/22 201 lb (91.2 kg)  07/29/21 197 lb (89.4 kg)     GEN: Well nourished, well developed in no acute distress HEENT: Normal NECK: No JVD; No carotid bruits LYMPHATICS: No lymphadenopathy CARDIAC: S1S2 noted,RRR, no murmurs, rubs, gallops RESPIRATORY:  Clear to auscultation without rales, wheezing or rhonchi  ABDOMEN: Soft, non-tender, non-distended, +bowel sounds, no guarding. EXTREMITIES: No  edema, No cyanosis, no clubbing MUSCULOSKELETAL:  No deformity  SKIN: Warm and dry NEUROLOGIC:  Alert and oriented x 3, non-focal PSYCHIATRIC:  Normal affect, good insight  ASSESSMENT:    1. Primary hypertension   2. Mixed hyperlipidemia     PLAN:     Blood pressure is acceptable, continue with current antihypertensive regimen.  Hyperlipidemia - continue with current statin medication.  The patient is in agreement with the above plan. The patient left the office in stable condition.  The patient will follow up in 1 year or sooner if needed.   Medication Adjustments/Labs and Tests Ordered: Current medicines are reviewed at length with the patient today.  Concerns regarding medicines are outlined above.  No orders of the defined types were placed in this encounter.  No orders of the defined types were placed in this encounter.   Patient Instructions  Medication Instructions:  Your physician recommends that you continue on your current medications as directed. Please refer to the Current Medication list given to you today.  *If you need a refill on your cardiac medications before your next appointment, please call your pharmacy*   Lab Work: NONE If you have labs (blood work) drawn today and your tests are completely normal, you will receive your results only  by: [place] (if you have MyChart) OR A paper copy in the mail If you have any lab test that is abnormal or we need to change your treatment, we will call you to review the results.   Testing/Procedures: NONE   Follow-Up: At [facility], you and your health needs are our priority.  As part of our continuing mission to provide you with exceptional heart care, we have created designated Provider Care Teams.  These Care Teams include your primary Cardiologist (physician) and Advanced Practice Providers (APPs -  Physician Assistants and Nurse Practitioners) who all work together to provide you with the care you need, when you need it.  We recommend signing up for the patient portal called "MyChart".  Sign up information is provided on this After Visit Summary.  MyChart is used to connect with patients for Virtual Visits (Telemedicine).  Patients are able to view lab/test results, encounter notes, upcoming appointments, etc.  Non-urgent messages can be sent to your provider as well.   To learn more about what you can do with MyChart, go to [email or web address].    Your next appointment:   9 month(s)  The format for your next appointment:   In Person  Provider:   Berniece Salines, DO     Adopting a Healthy Lifestyle.  Know what a healthy weight is for you (roughly BMI <25) and aim to maintain this   Aim for 7+ servings of fruits and vegetables daily   65-80+ fluid ounces of water or unsweet tea for healthy kidneys   Limit to max 1 drink of alcohol per day; avoid smoking/tobacco   Limit animal fats in diet for cholesterol and heart health - choose grass fed whenever available   Avoid highly processed foods, and foods high in saturated/trans fats   Aim for low stress - take time to unwind and care for your mental health   Aim for 150 min of moderate intensity exercise weekly for heart health, and weights twice weekly for bone health   Aim for 7-9 hours of sleep  daily   When it comes to diets, agreement about the perfect plan isnt easy to find, even among the experts. Experts at the  [employer] developed an idea known as the Healthy Eating Plate. Just imagine a plate divided into logical, healthy portions.   The emphasis is on diet quality:   Load up on vegetables and fruits - one-half of your plate: Aim for color and variety, and remember that potatoes dont count.   Go for whole grains - one-quarter of your plate: Whole wheat, barley, wheat berries, quinoa, oats, brown rice, and foods made with them. If you want pasta, go with whole wheat pasta.   Protein power - one-quarter of your plate: Fish, chicken, beans, and nuts are all healthy, versatile protein sources. Limit red meat.   The diet, however, does go beyond the plate, offering a few other suggestions.   Use healthy plant oils, such as olive, canola, soy, corn, sunflower and peanut. Check the labels, and avoid partially hydrogenated oil, which have unhealthy trans fats.   If youre thirsty, drink water. Coffee and tea are good in moderation, but skip sugary drinks and limit milk and dairy products to one or two daily servings.   The type of carbohydrate in the diet is more important than the amount. Some sources of carbohydrates, such as vegetables, fruits, whole grains, and beans-are healthier than others.   Finally, stay active  Signed, Berniece Salines, DO  02/03/2022 4:42 PM    [facility]

## 2022-02-11 ENCOUNTER — Other Ambulatory Visit: Payer: Self-pay | Admitting: Pulmonary Disease

## 2022-02-18 ENCOUNTER — Other Ambulatory Visit: Payer: Self-pay | Admitting: Pulmonary Disease

## 2022-02-18 DIAGNOSIS — Z23 Encounter for immunization: Secondary | ICD-10-CM | POA: Diagnosis not present

## 2022-02-18 DIAGNOSIS — N401 Enlarged prostate with lower urinary tract symptoms: Secondary | ICD-10-CM | POA: Diagnosis not present

## 2022-02-18 DIAGNOSIS — R3912 Poor urinary stream: Secondary | ICD-10-CM | POA: Diagnosis not present

## 2022-02-18 DIAGNOSIS — R972 Elevated prostate specific antigen [PSA]: Secondary | ICD-10-CM | POA: Diagnosis not present

## 2022-02-24 DIAGNOSIS — D519 Vitamin B12 deficiency anemia, unspecified: Secondary | ICD-10-CM | POA: Diagnosis not present

## 2022-03-27 DIAGNOSIS — D519 Vitamin B12 deficiency anemia, unspecified: Secondary | ICD-10-CM | POA: Diagnosis not present

## 2022-04-07 ENCOUNTER — Ambulatory Visit (INDEPENDENT_AMBULATORY_CARE_PROVIDER_SITE_OTHER): Payer: Medicare Other | Admitting: Pulmonary Disease

## 2022-04-07 ENCOUNTER — Encounter (HOSPITAL_BASED_OUTPATIENT_CLINIC_OR_DEPARTMENT_OTHER): Payer: Self-pay | Admitting: Pulmonary Disease

## 2022-04-07 VITALS — BP 130/60 | HR 55 | Ht 70.0 in | Wt 194.8 lb

## 2022-04-07 DIAGNOSIS — J449 Chronic obstructive pulmonary disease, unspecified: Secondary | ICD-10-CM

## 2022-04-07 MED ORDER — BUDESONIDE 0.5 MG/2ML IN SUSP: 0.5000 mg | Freq: Every day | RESPIRATORY_TRACT | 5 refills | Status: DC

## 2022-04-07 NOTE — [place] Notes (Signed)
Subjective:   PATIENT ID: Mike Mclaughlin GENDER: male DOB: Feb 08, 1935, MRN: [id number]   HPI  Chief Complaint  Patient presents with   Follow-up    Wasn't able to get pul[facility]   Reason for Visit: Follow-up   Mike Mclaughlin is a 88 year old male with chronic hypoxemic respiratory failure secondary to COPD, bradycardia, probable pulmonary hypertension and chronic diastolic heart failure who presents for follow-up.  Synopsis: 2021 - Established care with [facility]. Started on Spiriva in January. Compliant on both Symbicort and Spiriva. On home oxygen nightly. Completed Pulmonary Rehab in [place] 2022 - Continued maintenance rehab program. Started requiring O2 with activity. Had [place] in July 2023 - Outpatient COPD exacerbation in Oct. SOB with exertion. On Symbicort and Spiriva and Duonebs TID. Compliant with oxygen with exertion  04/07/22 Wife is present and provides additional history. Since our last visit he has reported compliant oxygen. Has been short of breath. Some cough. No wheezing. On Symbicort and Spiriva. Using Duonebs four times a day which is drying out his mouth. Has not started Pulmicort daily. Exercising twice a week. He is willing to participate in Pulmonary rehab but wife worries about his oxygen compliance during transportation.  Social History: 1/2ppd x 20 years. Quit 2013.  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  Past Medical History:  Diagnosis Date   Arthritis    Cardiac dysrhythmia, unspecified    Chronic pain of right knee 09/04/2017   Chronic respiratory failure with hypoxia 11/14/2015          COPD GOLD III 11/29/2013       DOE (dyspnea on exertion) 10/06/2018   Dyslipidemia    Esophageal reflux    H/O sinus bradycardia    Hypertension    Insomnia, unspecified    Malaise and fatigue 10/06/2018   Medication management 10/13/2019   Mixed hyperlipidemia    Physical  deconditioning 07/12/2019   Rheumatism    Sinus bradycardia 09/04/2017   Skin cancer, basal cell    Solitary pulmonary nodule 11/29/2013   L lingula 52m 10/2013.  Rescan 10/2014>>>no change in nodule,  Final CT 10/19/15 (Tia Alert > no change so no dedicated f/u rec    Status post right hip replacement 11/18/2017      Outpatient Medications Prior to Visit  Medication Sig Dispense Refill   albuterol (VENTOLIN HFA) 108 (90 Base) MCG/ACT inhaler INHALE 1 PUFF INTO THE LUNGS EVERY 6 HOURS AS NEEDED FOR WHEEZING OR SHORTNESS OF BREATH. 17 each 5   aspirin 81 MG tablet Take 81 mg by mouth daily.     ipratropium-albuterol (DUONEB) 0.5-2.5 (3) MG/3ML SOLN USE 1 VIAL IN NEBULIZER EVERY 6 HOURS 120 mL 11   omeprazole (PRILOSEC) 20 MG capsule Take 20 mg by mouth daily.     OXYGEN Place 3 L into the nose daily. 2.5 lpm with sleep     SYMBICORT 160-4.5 MCG/ACT inhaler INHALE 2 PUFFS INTO THE LUNGS TWICE A DAY 10.2 each 6   Tamsulosin HCl (FLOMAX) 0.4 MG CAPS Take 0.4 mg by mouth daily.      Tiotropium Bromide Monohydrate (SPIRIVA RESPIMAT) 2.5 MCG/ACT AERS INHALE 2 PUFFS BY MOUTH INTO THE LUNGS DAILY 4 g 6   budesonide (PULMICORT) 0.5 MG/2ML nebulizer solution Take 2 mLs (0.5 mg total) by nebulization daily. (Patient not taking: Reported on 04/07/2022) 60 mL 5   No facility-administered medications prior to visit.    Review of Systems  Constitutional:  Negative for chills, diaphoresis, fever, malaise/fatigue and weight loss.  HENT:  Negative for congestion.   Respiratory:  Positive for cough and shortness of breath. Negative for hemoptysis, sputum production and wheezing.   Cardiovascular:  Negative for chest pain, palpitations and leg swelling.     Objective:   Vitals:   04/07/22 1311  BP: 130/60  Pulse: (!) 55  SpO2: 96%  Weight: 194 lb 12.8 oz (88.4 kg)  Height: '5\' 10"'$  (1.778 m)   SpO2: 96 % (3L pulse) O2 Device: Nasal cannula O2 Flow Rate (L/min): 3 L/min O2 Type: Pulse O2  Physical  Exam: General: Elderly-appearing, no acute distress HENT: [place], AT Eyes: EOMI, no scleral icterus Respiratory: Diminished but clear to auscultation bilaterally.  No crackles, wheezing or rales Cardiovascular: RRR, -M/R/G, no JVD Extremities:-Edema,-tenderness Neuro: AAO x3, CNII-XII grossly intact Psych: Normal mood, normal affect   Data Reviewed:  Imaging: CT Chest 10/23/15 (report only) - Centrilobular emphysema. 23m lingular nodule improved compared to prior imaging CT Chest 08/01/19 - Diffuse moderate centrilobular emphysema. Left lingular atelectasis  PFT: 11/14/15 FVC 2.0 (48%) FEV1 1.0 (33%) Ratio 49  Interpretation: Very severe obstructive defect present  01/17/21 FVC 2.71 (68%) FEV1 1.34 (48%) Ratio 48  TLC 103% DLCO 47% Interpretation: Severe obstructive defect with air trapping and moderately reduced DLCO consistent with emphysema. Significant bronchodilator response in FEV1 and FVC  04/08/21 FVC 2.65 (68%) FEV1 1.26 (46%) Ratio 46  TLC 107% RV 175% DLCO 42% Interpretation: Severe obstructive defect with air trapping and moderately reduced DLCO consistent with emphysema. Significant bronchodilator response with FEV1  Labs: CBC    Component Value Date/Time   WBC 5.6 04/13/2019 1502   RBC 4.43 04/13/2019 1502   HGB 11.8 (L) 04/13/2019 1502   HCT 37.8 (L) 04/13/2019 1502   PLT 219.0 04/13/2019 1502   MCV 85.3 04/13/2019 1502   MCHC 31.4 04/13/2019 1502   RDW 14.2 04/13/2019 1502   LYMPHSABS 1.4 04/13/2019 1502   MONOABS 0.6 04/13/2019 1502   EOSABS 0.2 04/13/2019 1502   BASOSABS 0.1 04/13/2019 1502   Absolute eos 04/13/19 - 200     Assessment & Plan:   Discussion: 88year old male with chronic hypoxemic respiratory failure secondary to COPD, probable pulmonary HTN (WHO III) and chronic diastolic heart failure who presents for follow-up. Recent COPD exacerbation that is resolving. Compliant with triple therapy however needing daily nebulizer support. Discussed  clinical course and management of COPD including bronchodilator regimen and action plan for exacerbation.  88year old male with chronic hypoxemic respiratory failure secondary to COPD, probable pulmonary HTN (WHO III) and chronic diastolic heart failure who presents for follow-up. Last exacerbation 12/2021. End-stage COPD requiring frequent nebulizer therapy on top of his triple therapy.  End-stage COPD START Pulmicort nebulizer once a day CONTINUE Duonebs q6h AS NEEDED for shortness of breath or wheezing CONTINUE Spiriva 2.5 mcg TWO puffs ONCE a day.  CONTINUE Symbicort 160-4.5 mcg TWO puffs TWICE a day.  CONTINUE Proair AS NEEDED for shortness of breath and wheezing REFER to pulmonary rehab. Previously completed in 2021   Chronic hypoxemic respiratory failure secondary to COPD Ambulatory O2 demonstrates increased O2 need Increased 5L pulsed supplemental oxygen to maintain levels >88% with activity and sleep  GOC Previously discussed goals of care including wish for CPR and ventilator. Please continue discussions at home  Hx of Nightmares/Activity --Call if you wish for sleep referral  Health Maintenance Immunization History  Administered Date(s) Administered   DTaP 06/30/2011  Fluad Quad(high Dose 65+) 01/03/2021   Influenza Nasal 12/21/2018   Influenza Split 01/01/2015   Influenza, High Dose Seasonal PF 01/09/2016, 01/12/2018, 01/12/2018, 12/26/2019   Influenza-Unspecified 12/22/2012, 12/22/2013, 12/22/2017   PFIZER(Purple Top)SARS-COV-2 Vaccination 04/05/2019, 05/06/2019, 02/01/2020   Pneumococcal Conjugate-13 09/28/2013   Pneumococcal Polysaccharide-23 05/30/2010   CT Lung Screen - not qualified  Orders Placed This Encounter  Procedures   AMB referral to pulmonary rehabilitation    Referral Priority:   Routine    Referral Type:   Consultation    Number of Visits Requested:   1   Meds ordered this encounter  Medications   DISCONTD: budesonide (PULMICORT) 0.5 MG/2ML  nebulizer solution    Sig: Take 2 mLs (0.5 mg total) by nebulization daily.    Dispense:  60 mL    Refill:  5   budesonide (PULMICORT) 0.5 MG/2ML nebulizer solution    Sig: Take 2 mLs (0.5 mg total) by nebulization daily.    Dispense:  60 mL    Refill:  5    Bill Part B, neb at non-LTC/ICF   No follow-ups on file. March  I have spent a total time of 30-minutes on the day of the appointment including chart review, data review, collecting history, coordinating care and discussing medical diagnosis and plan with the patient/family. Past medical history, allergies, medications were reviewed. Pertinent imaging, labs and tests included in this note have been reviewed and interpreted independently by me.  [place], MD [facility] 04/07/2022 2:08 PM  Office Number [phone]

## 2022-04-07 NOTE — Patient Instructions (Addendum)
End-stage COPD START Pulmicort nebulizer once a day CONTINUE Duonebs q6h AS NEEDED for shortness of breath or wheezing CONTINUE Spiriva 2.5 mcg TWO puffs ONCE a day.  CONTINUE Symbicort 160-4.5 mcg TWO puffs TWICE a day.  CONTINUE Proair AS NEEDED for shortness of breath and wheezing CONTINUE maintenance exercise 3-5 times a week   Chronic hypoxemic respiratory failure secondary to COPD Ambulatory O2 in-clinic today CONTINUE 5L pulsed supplemental oxygen to maintain levels >88% with activity and sleep  Follow-up with me in March

## 2022-04-11 DIAGNOSIS — Z6827 Body mass index (BMI) 27.0-27.9, adult: Secondary | ICD-10-CM | POA: Diagnosis not present

## 2022-04-11 DIAGNOSIS — J9611 Chronic respiratory failure with hypoxia: Secondary | ICD-10-CM | POA: Diagnosis not present

## 2022-04-11 DIAGNOSIS — Z9981 Dependence on supplemental oxygen: Secondary | ICD-10-CM | POA: Diagnosis not present

## 2022-04-11 DIAGNOSIS — K4090 Unilateral inguinal hernia, without obstruction or gangrene, not specified as recurrent: Secondary | ICD-10-CM | POA: Diagnosis not present

## 2022-04-24 DIAGNOSIS — D519 Vitamin B12 deficiency anemia, unspecified: Secondary | ICD-10-CM | POA: Diagnosis not present

## 2022-04-29 DIAGNOSIS — K4090 Unilateral inguinal hernia, without obstruction or gangrene, not specified as recurrent: Secondary | ICD-10-CM | POA: Diagnosis not present

## 2022-05-05 DIAGNOSIS — J449 Chronic obstructive pulmonary disease, unspecified: Secondary | ICD-10-CM | POA: Diagnosis not present

## 2022-05-05 DIAGNOSIS — I10 Essential (primary) hypertension: Secondary | ICD-10-CM | POA: Diagnosis not present

## 2022-05-05 DIAGNOSIS — K4090 Unilateral inguinal hernia, without obstruction or gangrene, not specified as recurrent: Secondary | ICD-10-CM | POA: Diagnosis not present

## 2022-05-16 ENCOUNTER — Telehealth (HOSPITAL_BASED_OUTPATIENT_CLINIC_OR_DEPARTMENT_OTHER): Payer: Self-pay | Admitting: Pulmonary Disease

## 2022-05-16 MED ORDER — IPRATROPIUM-ALBUTEROL 0.5-2.5 (3) MG/3ML IN SOLN: 3.0000 mL | Freq: Four times a day (QID) | RESPIRATORY_TRACT | 11 refills | Status: DC | PRN

## 2022-05-16 NOTE — Telephone Encounter (Signed)
Pt wife called and stated ipratropium-albuterol (DUONEB) 0.5-2.5 (3) MG/3ML SOLN [id number] needed refill called into [facility] [phone]. PT wife worried since has been without for a week and pt needs medication. Please Advise.

## 2022-05-16 NOTE — Telephone Encounter (Signed)
I have sent refill on Duoneb to [facility]  I called and left a detailed msg for the pt's spouse ok per [facility]

## 2022-05-26 DIAGNOSIS — E538 Deficiency of other specified B group vitamins: Secondary | ICD-10-CM | POA: Diagnosis not present

## 2022-06-10 ENCOUNTER — Ambulatory Visit (INDEPENDENT_AMBULATORY_CARE_PROVIDER_SITE_OTHER): Payer: Medicare Other | Admitting: Pulmonary Disease

## 2022-06-10 ENCOUNTER — Other Ambulatory Visit (HOSPITAL_BASED_OUTPATIENT_CLINIC_OR_DEPARTMENT_OTHER): Payer: Self-pay

## 2022-06-10 ENCOUNTER — Encounter (HOSPITAL_BASED_OUTPATIENT_CLINIC_OR_DEPARTMENT_OTHER): Payer: Self-pay | Admitting: Pulmonary Disease

## 2022-06-10 VITALS — BP 150/70 | HR 54 | Ht 70.0 in | Wt 195.2 lb

## 2022-06-10 DIAGNOSIS — J9611 Chronic respiratory failure with hypoxia: Secondary | ICD-10-CM

## 2022-06-10 DIAGNOSIS — R5381 Other malaise: Secondary | ICD-10-CM

## 2022-06-10 DIAGNOSIS — J449 Chronic obstructive pulmonary disease, unspecified: Secondary | ICD-10-CM

## 2022-06-10 MED ORDER — OPTICHAMBER DIAMOND-LG MASK DEVI
0 refills | Status: DC
  Filled 2022-06-10: qty 1, 30d supply, fill #0

## 2022-06-10 NOTE — Progress Notes (Signed)
Subjective:   PATIENT ID: Mike Mclaughlin GENDER: male DOB: [birth year], MRN: [id number]   HPI  Chief Complaint  Patient presents with   Follow-up    Feeling good had hernia surgery   Reason for Visit: Follow-up   [name] is a 88 year old male with chronic hypoxemic respiratory failure secondary to COPD, bradycardia, probable pulmonary hypertension and chronic diastolic heart failure who presents for follow-up.  Synopsis: 2021 - Established care with  Pulmonary. Started on Spiriva in January. Compliant on both Symbicort and Spiriva. On home oxygen nightly. Completed Pulmonary Rehab in [place] 2022 - Continued maintenance rehab program. Started requiring O2 with activity. Had [place] in July 2023 - Outpatient COPD exacerbation in Oct. SOB with exertion. On Symbicort and Spiriva and Duonebs TID. Compliant with oxygen with exertion  04/07/22 Wife is present and provides additional history. Since our last visit he has reported compliant oxygen. Has been short of breath. Some cough. No wheezing. On Symbicort and Spiriva. Using Duonebs four times a day which is drying out his mouth. Has not started Pulmicort daily. Exercising twice a week. He is willing to participate in Pulmonary rehab but wife worries about his oxygen compliance during transportation.  06/10/22 Wife present and provides additional history. More compliant with oxygen during the day and wearing it nightly. Inogen battery is not lasting long enough to last through church service unless they are able to sit next to an outlet. For his bronchodilator regimen he is currently on Pulmicort twice a day and Duonebs 3-4x a day. Using Symbicort and Spiriva as well. Will use albuterol on average twice a day for shortness of breath. Denies cough and wheezing. He is increasing his activity at home and is enrolled in pulmonary rehab but has had to miss class due to various reasons. Prefers to exercise on his own.  Social  History: 1/2ppd x 20 years. Quit 2013.  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  Past Medical History:  Diagnosis Date   Arthritis    Cardiac dysrhythmia, unspecified    Chronic pain of right knee 09/04/2017   Chronic respiratory failure with hypoxia 11/14/2015          COPD GOLD III 11/29/2013       DOE (dyspnea on exertion) 10/06/2018   Dyslipidemia    Esophageal reflux    H/O sinus bradycardia    Hypertension    Insomnia, unspecified    Malaise and fatigue 10/06/2018   Medication management 10/13/2019   Mixed hyperlipidemia    Physical deconditioning 07/12/2019   Rheumatism    Sinus bradycardia 09/04/2017   Skin cancer, basal cell    Solitary pulmonary nodule 11/29/2013   L lingula 36mm 10/2013.  Rescan 10/2014>>>no change in nodule,  Final CT 10/19/15 Tia Alert) > no change so no dedicated f/u rec    Status post right hip replacement 11/18/2017      Outpatient Medications Prior to Visit  Medication Sig Dispense Refill   albuterol (VENTOLIN HFA) 108 (90 Base) MCG/ACT inhaler INHALE 1 PUFF INTO THE LUNGS EVERY 6 HOURS AS NEEDED FOR WHEEZING OR SHORTNESS OF BREATH. 17 each 5   aspirin 81 MG tablet Take 81 mg by mouth daily.     budesonide (PULMICORT) 0.5 MG/2ML nebulizer solution Take 2 mLs (0.5 mg total) by nebulization daily. 60 mL 5   ipratropium-albuterol (DUONEB) 0.5-2.5 (3) MG/3ML SOLN Take 3 mLs by nebulization every 6 (six) hours as needed. 120 mL 11   omeprazole (  PRILOSEC) 20 MG capsule Take 20 mg by mouth daily.     OXYGEN Place 3 L into the nose daily. 2.5 lpm with sleep     SYMBICORT 160-4.5 MCG/ACT inhaler INHALE 2 PUFFS INTO THE LUNGS TWICE A DAY 10.2 each 6   Tamsulosin HCl (FLOMAX) 0.4 MG CAPS Take 0.4 mg by mouth daily.      Tiotropium Bromide Monohydrate (SPIRIVA RESPIMAT) 2.5 MCG/ACT AERS INHALE 2 PUFFS BY MOUTH INTO THE LUNGS DAILY 4 g 6   No facility-administered medications prior to visit.    Review of Systems   Constitutional:  Negative for chills, diaphoresis, fever, malaise/fatigue and weight loss.  HENT:  Negative for congestion.   Respiratory:  Positive for shortness of breath. Negative for cough, hemoptysis, sputum production and wheezing.   Cardiovascular:  Negative for chest pain, palpitations and leg swelling.     Objective:   Vitals:   06/10/22 1309 06/10/22 1350  BP: (!) 160/62 (!) 150/70  Pulse: (!) 54   SpO2: 95%   Weight: 195 lb 3.5 oz (88.6 kg)   Height: 5\' 10"  (1.778 m)    SpO2: 95 % O2 Device: Nasal cannula O2 Flow Rate (L/min): 4 L/min O2 Type: Pulse O2  Physical Exam: General: Elderly-appearing, no acute distress HENT: [place], AT Eyes: EOMI, no scleral icterus Respiratory: Diminished but clear to auscultation bilaterally.  No crackles, wheezing or rales Cardiovascular: RRR, -M/R/G, no JVD Extremities:-Edema,-tenderness Neuro: AAO x4, CNII-XII grossly intact Psych: Normal mood, normal affect  Data Reviewed:  Imaging: CT Chest 10/23/15 (report only) - Centrilobular emphysema. 69mm lingular nodule improved compared to prior imaging CT Chest 08/01/19 - Diffuse moderate centrilobular emphysema. Left lingular atelectasis  PFT: 11/14/15 FVC 2.0 (48%) FEV1 1.0 (33%) Ratio 49  Interpretation: Very severe obstructive defect present  01/17/21 FVC 2.71 (68%) FEV1 1.34 (48%) Ratio 48  TLC 103% DLCO 47% Interpretation: Severe obstructive defect with air trapping and moderately reduced DLCO consistent with emphysema. Significant bronchodilator response in FEV1 and FVC  04/08/21 FVC 2.65 (68%) FEV1 1.26 (46%) Ratio 46  TLC 107% RV 175% DLCO 42% Interpretation: Severe obstructive defect with air trapping and moderately reduced DLCO consistent with emphysema. Significant bronchodilator response with FEV1  Labs: CBC    Component Value Date/Time   WBC 5.6 04/13/2019 1502   RBC 4.43 04/13/2019 1502   HGB 11.8 (L) 04/13/2019 1502   HCT 37.8 (L) 04/13/2019 1502   PLT 219.0  04/13/2019 1502   MCV 85.3 04/13/2019 1502   MCHC 31.4 04/13/2019 1502   RDW 14.2 04/13/2019 1502   LYMPHSABS 1.4 04/13/2019 1502   MONOABS 0.6 04/13/2019 1502   EOSABS 0.2 04/13/2019 1502   BASOSABS 0.1 04/13/2019 1502   Absolute eos 04/13/19 - 200     Assessment & Plan:   Discussion: 88 year old male with chronic hypoxemic respiratory failure secondary to COPD, probable pulmonary HTN (WHO III) and chronic diastolic heart failure who presents for follow-up. On nebulizer therapy as well as inhalers. Discussed clinical course and management of COPD including oxygen compliance, bronchodilator regimen, preventive care including vaccinations and action plan for exacerbation. He has end stage COPD with frequent nebulizer and rescue support in additional to his triple therapy. Discussed QOL with pulmonary rehab. Patient declined preferring to exercise on his own.  End-stage COPD CONTINUE Pulmicort nebulizer twice a day CONITNUE Duonebs q6h AS NEEDED for shortness of breath or wheezing CONTINUE Spiriva 2.5 mcg TWO puffs ONCE a day.  CONTINUE Symbicort 160-4.5 mcg TWO puffs  TWICE a day.  CONTINUE Proair AS NEEDED for shortness of breath and wheezing Declined pulmonary rehab. Previously completed in 2021. Encourage regular exercise ORDER spacer. Pick this up in the pharmacy on the 1st floor    Chronic hypoxemic respiratory failure secondary to COPD Continue 5L pulsed supplemental oxygen to maintain levels >88% with activity and sleep  GOC Previously discussed goals of care including wish for CPR and ventilator. Please continue discussions at home  Hx of Nightmares/Activity --Call if you wish for sleep referral  Health Maintenance Immunization History  Administered Date(s) Administered   DTaP 06/30/2011   Fluad Quad(high Dose 65+) 01/03/2021   Influenza Nasal 12/21/2018   Influenza Split 01/01/2015   Influenza, High Dose Seasonal PF 01/09/2016, 01/12/2018, 01/12/2018, 12/26/2019    Influenza-Unspecified 12/22/2012, 12/22/2013, 12/22/2017   PFIZER(Purple Top)SARS-COV-2 Vaccination 04/05/2019, 05/06/2019, 02/01/2020   Pneumococcal Conjugate-13 09/28/2013   Pneumococcal Polysaccharide-23 05/30/2010   CT Lung Screen - not qualified  No orders of the defined types were placed in this encounter.  Meds ordered this encounter  Medications   Spacer/Aero-Holding Chambers (OPTICHAMBER DIAMOND-LG MASK) DEVI    Sig: Use with inhaler    Dispense:  1 each    Refill:  0   Return in about 3 months (around 09/10/2022) for routine follow-up, with Dr. Loanne Drilling.   I have spent a total time of 32-minutes on the day of the appointment including chart review, data review, collecting history, coordinating care and discussing medical diagnosis and plan with the patient/family. Past medical history, allergies, medications were reviewed. Pertinent imaging, labs and tests included in this note have been reviewed and interpreted independently by me.  [place], MD [facility] 06/10/2022 2:58 PM  Office Number [phone]

## 2022-06-10 NOTE — Patient Instructions (Signed)
End-stage COPD CONTINUE Pulmicort nebulizer twice a day CONITNUE Duonebs q6h AS NEEDED for shortness of breath or wheezing CONTINUE Spiriva 2.5 mcg TWO puffs ONCE a day.  CONTINUE Symbicort 160-4.5 mcg TWO puffs TWICE a day.  CONTINUE Proair AS NEEDED for shortness of breath and wheezing Declined pulmonary rehab. Previously completed in 2021. Encourage regular exercise ORDER spacer. Pick this up in the pharmacy on the 1st floor   Chronic hypoxemic respiratory failure secondary to COPD Continue 5L pulsed supplemental oxygen to maintain levels >88% with activity and sleep

## 2022-06-11 DIAGNOSIS — R5381 Other malaise: Secondary | ICD-10-CM | POA: Diagnosis not present

## 2022-06-11 DIAGNOSIS — D638 Anemia in other chronic diseases classified elsewhere: Secondary | ICD-10-CM | POA: Diagnosis not present

## 2022-06-11 DIAGNOSIS — J9611 Chronic respiratory failure with hypoxia: Secondary | ICD-10-CM | POA: Diagnosis not present

## 2022-06-11 DIAGNOSIS — R5382 Chronic fatigue, unspecified: Secondary | ICD-10-CM | POA: Diagnosis not present

## 2022-06-11 DIAGNOSIS — G4700 Insomnia, unspecified: Secondary | ICD-10-CM | POA: Diagnosis not present

## 2022-06-20 ENCOUNTER — Inpatient Hospital Stay: Payer: Medicare Other | Admitting: Oncology

## 2022-06-20 ENCOUNTER — Inpatient Hospital Stay: Payer: Medicare Other

## 2022-06-20 ENCOUNTER — Other Ambulatory Visit: Payer: Self-pay | Admitting: Pharmacist

## 2022-06-20 DIAGNOSIS — D649 Anemia, unspecified: Secondary | ICD-10-CM

## 2022-06-23 DIAGNOSIS — D519 Vitamin B12 deficiency anemia, unspecified: Secondary | ICD-10-CM | POA: Diagnosis not present

## 2022-06-29 NOTE — Progress Notes (Signed)
[facility]  [address] [place],  [place]  [address] [phone]  Clinic Day: 06/30/2022  Referring physician: Julianne Handler, NP   HISTORY OF PRESENT ILLNESS:  The patient is a 88 y.o. male  who I was asked to consult upon for iron deficiency anemia.  Recent labs showed a low hemoglobin of 9.8, with a low MCV of 85.  Iron studies done recently showed a low ferritin of 23, a low serum iron of 31, a TIBC of 371, and a low iron saturation of 8%. His wife claims he has been very tired.  However, he denies having any overt forms of blood loss.  He is currently taking a multivitamin with iron.  He last had a colonoscopy years ago.  To his knowledge, there is no family history of anemia or other hematologic disorders.    PAST MEDICAL HISTORY:   Past Medical History:  Diagnosis Date   Arthritis    Cardiac dysrhythmia, unspecified    Chronic pain of right knee 09/04/2017   Chronic respiratory failure with hypoxia 11/14/2015          COPD GOLD III 11/29/2013       DOE (dyspnea on exertion) 10/06/2018   Dyslipidemia    Esophageal reflux    H/O sinus bradycardia    Hypertension    Insomnia, unspecified    Malaise and fatigue 10/06/2018   Medication management 10/13/2019   Mixed hyperlipidemia    Physical deconditioning 07/12/2019   Rheumatism    Sinus bradycardia 09/04/2017   Skin cancer, basal cell    Solitary pulmonary nodule 11/29/2013   L lingula 48mm 10/2013.  Rescan 10/2014>>>no change in nodule,  Final CT 10/19/15 Rosalita Levan) > no change so no dedicated f/u rec    Status post right hip replacement 11/18/2017    PAST SURGICAL HISTORY:   Past Surgical History:  Procedure Laterality Date   CATARACT EXTRACTION Bilateral    COLONOSCOPY WITH PROPOFOL N/A 08/03/2020   Procedure: COLONOSCOPY WITH PROPOFOL;  Surgeon: Jeani Hawking, MD;  Location: [facility];  Service: Endoscopy;  Laterality: N/A;   ESOPHAGOGASTRODUODENOSCOPY N/A 01/30/2017   Procedure:  ESOPHAGOGASTRODUODENOSCOPY (EGD);  Surgeon: Jeani Hawking, MD;  Location: [facility];  Service: Endoscopy;  Laterality: N/A;   ESOPHAGOGASTRODUODENOSCOPY (EGD) WITH PROPOFOL N/A 08/03/2020   Procedure: ESOPHAGOGASTRODUODENOSCOPY (EGD) WITH PROPOFOL;  Surgeon: Jeani Hawking, MD;  Location: [facility];  Service: Endoscopy;  Laterality: N/A;   hemmorrhoid surgery     HEMOSTASIS CLIP PLACEMENT  08/03/2020   Procedure: HEMOSTASIS CLIP PLACEMENT;  Surgeon: Jeani Hawking, MD;  Location: [facility];  Service: Endoscopy;;   HERNIA REPAIR Bilateral    INGUINAL   HIP ARTHROPLASTY Right    HOT HEMOSTASIS N/A 08/03/2020   Procedure: HOT HEMOSTASIS (ARGON PLASMA COAGULATION/BICAP);  Surgeon: Jeani Hawking, MD;  Location: [facility];  Service: Endoscopy;  Laterality: N/A;   POLYPECTOMY      CURRENT MEDICATIONS:   Current Outpatient Medications  Medication Sig Dispense Refill   cholecalciferol 25 MCG (1000 UT) tablet Take 1,000 Units by mouth daily.     Cyanocobalamin (B-12 COMPLIANCE INJECTION) 1000 MCG/ML KIT Inject as directed.     ipratropium-albuterol (DUONEB) 0.5-2.5 (3) MG/3ML SOLN Take 3 mLs by nebulization.     Multiple Vitamin (MULTIVITAMIN) tablet Take 1 tablet by mouth daily.     polyethylene glycol (MIRALAX / GLYCOLAX) 17 g packet Take 17 g by mouth daily.     albuterol (VENTOLIN HFA) 108 (90 Base) MCG/ACT inhaler  INHALE 1 PUFF INTO THE LUNGS EVERY 6 HOURS AS NEEDED FOR WHEEZING OR SHORTNESS OF BREATH. 17 each 5   aspirin 81 MG tablet Take 81 mg by mouth daily.     budesonide (PULMICORT) 0.5 MG/2ML nebulizer solution Take 2 mLs (0.5 mg total) by nebulization daily. 60 mL 5   ipratropium-albuterol (DUONEB) 0.5-2.5 (3) MG/3ML SOLN Take 3 mLs by nebulization every 6 (six) hours as needed. 120 mL 11   omeprazole (PRILOSEC) 20 MG capsule Take 20 mg by mouth daily.     OXYGEN Place 3 L into the nose daily. 2.5 lpm with sleep     Spacer/Aero-Holding Chambers (OPTICHAMBER DIAMOND-LG MASK)  DEVI Use with inhaler 1 each 0   SYMBICORT 160-4.5 MCG/ACT inhaler INHALE 2 PUFFS INTO THE LUNGS TWICE A DAY 10.2 each 6   Tamsulosin HCl (FLOMAX) 0.4 MG CAPS Take 0.4 mg by mouth daily.      Tiotropium Bromide Monohydrate (SPIRIVA RESPIMAT) 2.5 MCG/ACT AERS INHALE 2 PUFFS BY MOUTH INTO THE LUNGS DAILY 4 g 6   No current facility-administered medications for this visit.    ALLERGIES:   Allergies  Allergen Reactions   Atorvastatin Calcium [Atorvastatin]     Unaware    Chantix [Varenicline]     Unaware    Other Rash    FAMILY HISTORY:   Family History  Problem Relation Age of Onset   Diverticulitis Mother    Rheumatologic disease Mother    Other Father        brain tumor   Stroke Father    Heart disease Sister    Cancer Sister    Heart disease Brother     SOCIAL HISTORY:  The patient was born and raised in [place].  He lives in town with his wife of 62 years.  They have 1 child, 3 grandchildren and 6 great-grandchildren.  He did textile mill work for 46 years.  He smoked a pack of cigarettes today for 27 years before quitting 20 years ago.  He denies a history of alcohol use.    REVIEW OF SYSTEMS:  Review of Systems  Constitutional:  Positive for fatigue. Negative for fever and unexpected weight change.  Respiratory:  Positive for cough and shortness of breath. Negative for chest tightness and hemoptysis.   Cardiovascular:  Negative for chest pain and palpitations.  Gastrointestinal:  Positive for constipation. Negative for abdominal distention, abdominal pain, blood in stool, diarrhea, nausea and vomiting.  Genitourinary:  Negative for dysuria, frequency and hematuria.   Musculoskeletal:  Positive for arthralgias. Negative for back pain and myalgias.  Skin:  Negative for itching and rash.  Neurological:  Negative for dizziness, headaches and light-headedness.  Psychiatric/Behavioral:  Negative for depression and suicidal ideas. The patient is not nervous/anxious.      PHYSICAL EXAM:  Blood pressure (!) 203/80, pulse (!) 47, temperature 98.9 F (37.2 C), resp. rate 16, height 5\' 11"  (1.803 m), weight 191 lb 8 oz (86.9 kg), SpO2 99 %. Wt Readings from Last 3 Encounters:  06/30/22 191 lb 8 oz (86.9 kg)  06/10/22 195 lb 3.5 oz (88.6 kg)  04/07/22 194 lb 12.8 oz (88.4 kg)   Body mass index is 26.71 kg/m. Performance status (ECOG): 2 - Symptomatic, <50% confined to bed Physical Exam Constitutional:      Appearance: Normal appearance. He is not ill-appearing.  HENT:     Mouth/Throat:     Mouth: Mucous membranes are moist.     Pharynx: Oropharynx is clear. No oropharyngeal  exudate or posterior oropharyngeal erythema.  Cardiovascular:     Rate and Rhythm: Normal rate and regular rhythm.     Heart sounds: No murmur heard.    No friction rub. No gallop.  Pulmonary:     Effort: Pulmonary effort is normal. No respiratory distress.     Breath sounds: Normal breath sounds. No wheezing, rhonchi or rales.  Abdominal:     General: Bowel sounds are normal. There is no distension.     Palpations: Abdomen is soft. There is no mass.     Tenderness: There is no abdominal tenderness.  Musculoskeletal:        General: No swelling.     Right lower leg: No edema.     Left lower leg: No edema.  Lymphadenopathy:     Cervical: No cervical adenopathy.     Upper Body:     Right upper body: No supraclavicular or axillary adenopathy.     Left upper body: No supraclavicular or axillary adenopathy.     Lower Body: No right inguinal adenopathy. No left inguinal adenopathy.  Skin:    General: Skin is warm.     Coloration: Skin is not jaundiced.     Findings: No lesion or rash.  Neurological:     General: No focal deficit present.     Mental Status: He is alert and oriented to person, place, and time. Mental status is at baseline.  Psychiatric:        Mood and Affect: Mood normal.        Behavior: Behavior normal.        Thought Content: Thought content normal.      LABS:      Latest Ref Rng & Units 06/30/2022   12:00 AM 04/13/2019    3:02 PM  CBC  WBC  7.0     5.6   Hemoglobin 13.5 - 17.5 10.3     11.8   Hematocrit 41 - 53 33     37.8   Platelets 150 - 400 K/uL 225     219.0      This result is from an external source.      Latest Ref Rng & Units 06/30/2022    2:32 PM 04/13/2019    3:02 PM  CMP  Glucose 70 - 99 mg/dL 82  948   BUN 8 - 23 mg/dL 29  24   Creatinine 5.46 - 1.24 mg/dL 2.70  3.50   Sodium 093 - 145 mmol/L 141  140   Potassium 3.5 - 5.1 mmol/L 4.7  4.5   Chloride 98 - 111 mmol/L 107  106   CO2 22 - 32 mmol/L 28  27   Calcium 8.9 - 10.3 mg/dL 9.0  9.0   Total Protein 6.5 - 8.1 g/dL 6.9    Total Bilirubin 0.3 - 1.2 mg/dL 0.6    Alkaline Phos 38 - 126 U/L 78    AST 15 - 41 U/L 20    ALT 0 - 44 U/L 16      Latest Reference Range & Units 06/30/22 14:33  Iron 45 - 182 ug/dL 34 (L)  UIBC ug/dL 818  TIBC 299 - 371 ug/dL 696 (H)  Saturation Ratios 17.9 - 39.5 % 7 (L)  Ferritin 24 - 336 ng/mL 9 (L)  (L): Data is abnormally low (H): Data is abnormally high  ASSESSMENT & PLAN:  A 88 y.o. male who I was asked to consult upon for iron deficiency anemia.  I will  arrange for him to receive IV iron over these next few weeks to rapidly replenish his iron stores and normalize his hemoglobin.  Of note, I did talk to the patient about considering a colonoscopy.  However, due to his age and comorbidities, he and his family were not particularly interested in undergoing this procedure at this time.  I will see him back in 3 months to reassess hi iron and hemoglobin levels to see how well he responded to his upcoming IV iron.  The patient understands all the plans discussed today and is in agreement with them.  I do appreciate Julianne Handler, NP for his new consult.   Jamin Panther Kirby Funk, MD

## 2022-06-30 ENCOUNTER — Inpatient Hospital Stay: Payer: Medicare Other | Attending: Oncology

## 2022-06-30 ENCOUNTER — Inpatient Hospital Stay (HOSPITAL_BASED_OUTPATIENT_CLINIC_OR_DEPARTMENT_OTHER): Payer: Medicare Other | Admitting: Oncology

## 2022-06-30 ENCOUNTER — Encounter: Payer: Self-pay | Admitting: Oncology

## 2022-06-30 VITALS — BP 203/80 | HR 47 | Temp 98.9°F | Resp 16 | Ht 71.0 in | Wt 191.5 lb

## 2022-06-30 DIAGNOSIS — E782 Mixed hyperlipidemia: Secondary | ICD-10-CM | POA: Insufficient documentation

## 2022-06-30 DIAGNOSIS — Z8249 Family history of ischemic heart disease and other diseases of the circulatory system: Secondary | ICD-10-CM | POA: Insufficient documentation

## 2022-06-30 DIAGNOSIS — R5383 Other fatigue: Secondary | ICD-10-CM | POA: Diagnosis not present

## 2022-06-30 DIAGNOSIS — Z809 Family history of malignant neoplasm, unspecified: Secondary | ICD-10-CM | POA: Diagnosis not present

## 2022-06-30 DIAGNOSIS — M2550 Pain in unspecified joint: Secondary | ICD-10-CM

## 2022-06-30 DIAGNOSIS — J449 Chronic obstructive pulmonary disease, unspecified: Secondary | ICD-10-CM | POA: Insufficient documentation

## 2022-06-30 DIAGNOSIS — R0602 Shortness of breath: Secondary | ICD-10-CM

## 2022-06-30 DIAGNOSIS — Z823 Family history of stroke: Secondary | ICD-10-CM | POA: Insufficient documentation

## 2022-06-30 DIAGNOSIS — F17210 Nicotine dependence, cigarettes, uncomplicated: Secondary | ICD-10-CM | POA: Insufficient documentation

## 2022-06-30 DIAGNOSIS — Z85828 Personal history of other malignant neoplasm of skin: Secondary | ICD-10-CM | POA: Insufficient documentation

## 2022-06-30 DIAGNOSIS — R059 Cough, unspecified: Secondary | ICD-10-CM

## 2022-06-30 DIAGNOSIS — D649 Anemia, unspecified: Secondary | ICD-10-CM

## 2022-06-30 DIAGNOSIS — Z8379 Family history of other diseases of the digestive system: Secondary | ICD-10-CM | POA: Diagnosis not present

## 2022-06-30 DIAGNOSIS — D509 Iron deficiency anemia, unspecified: Secondary | ICD-10-CM | POA: Insufficient documentation

## 2022-06-30 DIAGNOSIS — K5900 Constipation, unspecified: Secondary | ICD-10-CM

## 2022-06-30 DIAGNOSIS — D500 Iron deficiency anemia secondary to blood loss (chronic): Secondary | ICD-10-CM | POA: Insufficient documentation

## 2022-06-30 DIAGNOSIS — Z79899 Other long term (current) drug therapy: Secondary | ICD-10-CM

## 2022-06-30 DIAGNOSIS — I10 Essential (primary) hypertension: Secondary | ICD-10-CM | POA: Diagnosis not present

## 2022-06-30 LAB — COMPREHENSIVE METABOLIC PANEL
ALT: 16 U/L (ref 0–44)
AST: 20 U/L (ref 15–41)
Albumin: 3.7 g/dL (ref 3.5–5.0)
Alkaline Phosphatase: 78 U/L (ref 38–126)
Anion gap: 6 (ref 5–15)
BUN: 29 mg/dL — ABNORMAL HIGH (ref 8–23)
CO2: 28 mmol/L (ref 22–32)
Calcium: 9 mg/dL (ref 8.9–10.3)
Chloride: 107 mmol/L (ref 98–111)
Creatinine, Ser: 1.27 mg/dL — ABNORMAL HIGH (ref 0.61–1.24)
GFR, Estimated: 55 mL/min — ABNORMAL LOW (ref >60)
Glucose, Bld: 82 mg/dL (ref 70–99)
Potassium: 4.7 mmol/L (ref 3.5–5.1)
Sodium: 141 mmol/L (ref 135–145)
Total Bilirubin: 0.6 mg/dL (ref 0.3–1.2)
Total Protein: 6.9 g/dL (ref 6.5–8.1)

## 2022-06-30 LAB — CBC AND DIFFERENTIAL
HCT: 33 — AB (ref 41–53)
Hemoglobin: 10.3 — AB (ref 13.5–17.5)
Neutrophils Absolute: 4.9
Platelets: 225 10*3/uL (ref 150–400)
WBC: 7

## 2022-06-30 LAB — IRON AND TIBC
Iron: 34 ug/dL — ABNORMAL LOW (ref 45–182)
Saturation Ratios: 7 % — ABNORMAL LOW (ref 17.9–39.5)
TIBC: 466 ug/dL — ABNORMAL HIGH (ref 250–450)
UIBC: 432 ug/dL

## 2022-06-30 LAB — CBC W DIFFERENTIAL (~~LOC~~ CC SCANNED REPORT)

## 2022-06-30 LAB — CBC: RBC: 3.8 — AB (ref 3.87–5.11)

## 2022-06-30 LAB — FERRITIN: Ferritin: 9 ng/mL — ABNORMAL LOW (ref 24–336)

## 2022-07-01 ENCOUNTER — Telehealth: Payer: Self-pay | Admitting: Oncology

## 2022-07-01 NOTE — Telephone Encounter (Signed)
Patient has been scheduled for follow-up visit per 06/30/22 LOS.  Pts spouse was given appt information via phone.

## 2022-07-02 ENCOUNTER — Encounter: Payer: Self-pay | Admitting: Oncology

## 2022-07-02 NOTE — Progress Notes (Incomplete)
[facility]  [address] [place],  [place]  [address] [phone]  Clinic Day: 06/30/2022  Referring physician: Julianne Handler, NP   HISTORY OF PRESENT ILLNESS:  The patient is a 88 y.o. male  who I was asked to consult upon for iron deficiency anemia.  Recent labs showed a low hemoglobin of 9.8, with a low MCV of 85.  Iron studies done recently showed a low ferritin of 23, a low serum iron of 31, a TIBC of 371, and a low iron saturation of 8%.   PAST MEDICAL HISTORY:   Past Medical History:  Diagnosis Date  . Arthritis   . Cardiac dysrhythmia, unspecified   . Chronic pain of right knee 09/04/2017  . Chronic respiratory failure with hypoxia 11/14/2015         . COPD GOLD III 11/29/2013      . DOE (dyspnea on exertion) 10/06/2018  . Dyslipidemia   . Esophageal reflux   . H/O sinus bradycardia   . Hypertension   . Insomnia, unspecified   . Malaise and fatigue 10/06/2018  . Medication management 10/13/2019  . Mixed hyperlipidemia   . Physical deconditioning 07/12/2019  . Rheumatism   . Sinus bradycardia 09/04/2017  . Skin cancer, basal cell   . Solitary pulmonary nodule 11/29/2013   L lingula 70mm 10/2013.  Rescan 10/2014>>>no change in nodule,  Final CT 10/19/15 Rosalita Levan) > no change so no dedicated f/u rec   . Status post right hip replacement 11/18/2017    PAST SURGICAL HISTORY:   Past Surgical History:  Procedure Laterality Date  . CATARACT EXTRACTION Bilateral   . COLONOSCOPY WITH PROPOFOL N/A 08/03/2020   Procedure: COLONOSCOPY WITH PROPOFOL;  Surgeon: Jeani Hawking, MD;  Location: [facility];  Service: Endoscopy;  Laterality: N/A;  . ESOPHAGOGASTRODUODENOSCOPY N/A 01/30/2017   Procedure: ESOPHAGOGASTRODUODENOSCOPY (EGD);  Surgeon: Jeani Hawking, MD;  Location: [facility];  Service: Endoscopy;  Laterality: N/A;  . ESOPHAGOGASTRODUODENOSCOPY (EGD) WITH PROPOFOL N/A 08/03/2020   Procedure: ESOPHAGOGASTRODUODENOSCOPY (EGD) WITH PROPOFOL;   Surgeon: Jeani Hawking, MD;  Location: [facility];  Service: Endoscopy;  Laterality: N/A;  . hemmorrhoid surgery    . HEMOSTASIS CLIP PLACEMENT  08/03/2020   Procedure: HEMOSTASIS CLIP PLACEMENT;  Surgeon: Jeani Hawking, MD;  Location: [facility];  Service: Endoscopy;;  . HERNIA REPAIR Bilateral    INGUINAL  . HIP ARTHROPLASTY Right   . HOT HEMOSTASIS N/A 08/03/2020   Procedure: HOT HEMOSTASIS (ARGON PLASMA COAGULATION/BICAP);  Surgeon: Jeani Hawking, MD;  Location: [facility];  Service: Endoscopy;  Laterality: N/A;  . POLYPECTOMY      CURRENT MEDICATIONS:   Current Outpatient Medications  Medication Sig Dispense Refill  . cholecalciferol 25 MCG (1000 UT) tablet Take 1,000 Units by mouth daily.    . Cyanocobalamin (B-12 COMPLIANCE INJECTION) 1000 MCG/ML KIT Inject as directed.    Marland Kitchen ipratropium-albuterol (DUONEB) 0.5-2.5 (3) MG/3ML SOLN Take 3 mLs by nebulization.    . Multiple Vitamin (MULTIVITAMIN) tablet Take 1 tablet by mouth daily.    . polyethylene glycol (MIRALAX / GLYCOLAX) 17 g packet Take 17 g by mouth daily.    Marland Kitchen albuterol (VENTOLIN HFA) 108 (90 Base) MCG/ACT inhaler INHALE 1 PUFF INTO THE LUNGS EVERY 6 HOURS AS NEEDED FOR WHEEZING OR SHORTNESS OF BREATH. 17 each 5  . aspirin 81 MG tablet Take 81 mg by mouth daily.    . budesonide (PULMICORT) 0.5 MG/2ML nebulizer solution Take 2 mLs (0.5 mg total) by nebulization daily. 60  mL 5  . ipratropium-albuterol (DUONEB) 0.5-2.5 (3) MG/3ML SOLN Take 3 mLs by nebulization every 6 (six) hours as needed. 120 mL 11  . omeprazole (PRILOSEC) 20 MG capsule Take 20 mg by mouth daily.    . OXYGEN Place 3 L into the nose daily. 2.5 lpm with sleep    . Spacer/Aero-Holding Chambers (OPTICHAMBER DIAMOND-LG MASK) DEVI Use with inhaler 1 each 0  . SYMBICORT 160-4.5 MCG/ACT inhaler INHALE 2 PUFFS INTO THE LUNGS TWICE A DAY 10.2 each 6  . Tamsulosin HCl (FLOMAX) 0.4 MG CAPS Take 0.4 mg by mouth daily.     . Tiotropium Bromide Monohydrate (SPIRIVA  RESPIMAT) 2.5 MCG/ACT AERS INHALE 2 PUFFS BY MOUTH INTO THE LUNGS DAILY 4 g 6   No current facility-administered medications for this visit.    ALLERGIES:   Allergies  Allergen Reactions  . Atorvastatin Calcium [Atorvastatin]     Unaware   . Chantix [Varenicline]     Unaware   . Other Rash    FAMILY HISTORY:   Family History  Problem Relation Age of Onset  . Diverticulitis Mother   . Rheumatologic disease Mother   . Other Father        brain tumor  . Stroke Father   . Heart disease Sister   . Cancer Sister   . Heart disease Brother     SOCIAL HISTORY:   reports that he quit smoking about 8 years ago. His smoking use included cigarettes. He has a 7.50 pack-year smoking history. He has never used smokeless tobacco. He reports that he does not drink alcohol and does not use drugs.  REVIEW OF SYSTEMS:  Review of Systems - Oncology   PHYSICAL EXAM:  Blood pressure (!) 203/80, pulse (!) 47, temperature 98.9 F (37.2 C), resp. rate 16, height 5\' 11"  (1.803 m), weight 191 lb 8 oz (86.9 kg), SpO2 99 %. Wt Readings from Last 3 Encounters:  06/30/22 191 lb 8 oz (86.9 kg)  06/10/22 195 lb 3.5 oz (88.6 kg)  04/07/22 194 lb 12.8 oz (88.4 kg)   Body mass index is 26.71 kg/m. Performance status (ECOG): {CHL ONC [id number] Physical Exam  LABS:      Latest Ref Rng & Units 06/30/2022   12:00 AM 04/13/2019    3:02 PM  CBC  WBC  7.0     5.6   Hemoglobin 13.5 - 17.5 10.3     11.8   Hematocrit 41 - 53 33     37.8   Platelets 150 - 400 K/uL 225     219.0      This result is from an external source.      Latest Ref Rng & Units 06/30/2022    2:32 PM 04/13/2019    3:02 PM  CMP  Glucose 70 - 99 mg/dL 82  638   BUN 8 - 23 mg/dL 29  24   Creatinine 7.56 - 1.24 mg/dL 4.33  2.95   Sodium 188 - 145 mmol/L 141  140   Potassium 3.5 - 5.1 mmol/L 4.7  4.5   Chloride 98 - 111 mmol/L 107  106   CO2 22 - 32 mmol/L 28  27   Calcium 8.9 - 10.3 mg/dL 9.0  9.0   Total Protein 6.5 - 8.1  g/dL 6.9    Total Bilirubin 0.3 - 1.2 mg/dL 0.6    Alkaline Phos 38 - 126 U/L 78    AST 15 - 41 U/L 20    ALT 0 -  44 U/L 16       No results found for: "CEA1", "CEA" / No results found for: "CEA1", "CEA" No results found for: "PSA1" No results found for: "ZOX096CAN199" No results found for: "CAN125"  No results found for: "TOTALPROTELP", "ALBUMINELP", "A1GS", "A2GS", "BETS", "BETA2SER", "GAMS", "MSPIKE", "SPEI" Lab Results  Component Value Date   TIBC 466 (H) 06/30/2022   FERRITIN 9 (L) 06/30/2022   IRONPCTSAT 7 (L) 06/30/2022   No results found for: "LDH"  No results found for: "AFPTUMOR", "TOTALPROTELP", "ALBUMINELP", "A1GS", "A2GS", "BETS", "BETA2SER", "GAMS", "MSPIKE", "SPEI", "LDH", "CEA1", "CEA", "PSA1", "IGASERUM", "IGGSERUM", "IGMSERUM", "THGAB", "THYROGLB"  Review Flowsheet       Latest Ref Rng & Units 06/30/2022  Oncology Labs  Ferritin 24 - 336 ng/mL 9   %SAT 17.9 - 39.5 % 7     STUDIES:  No results found.   ASSESSMENT & PLAN:  A 88 y.o. male who I was asked to consult upon for iron deficiency anemia.  I will arrange for him/her to receive IV iron over these next few weeks to rapidly replenish his/her iron stores and normalize his/her hemoglobin.  I will see him/her back in 3 months to reassess her iron and hemoglobin levels to see how well he/she responded to his/her upcoming IV iron.  The patient understands all the plans discussed today and is in agreement with them.  I do appreciate Julianne Handlerouth, Sarah T, NP for his new consult.   Xaiver Roskelley Kirby FunkA Jaretssi Kraker, MD

## 2022-07-03 ENCOUNTER — Encounter: Payer: Self-pay | Admitting: Oncology

## 2022-07-03 MED FILL — Ferumoxytol Inj 510 MG/17ML (30 MG/ML) (Elemental Fe): INTRAVENOUS | Qty: 17 | Status: AC

## 2022-07-04 ENCOUNTER — Inpatient Hospital Stay: Payer: Medicare Other

## 2022-07-04 VITALS — BP 169/64 | HR 59 | Temp 97.9°F | Resp 18 | Ht 71.0 in | Wt 189.1 lb

## 2022-07-04 DIAGNOSIS — D509 Iron deficiency anemia, unspecified: Secondary | ICD-10-CM | POA: Diagnosis not present

## 2022-07-04 DIAGNOSIS — R059 Cough, unspecified: Secondary | ICD-10-CM | POA: Diagnosis not present

## 2022-07-04 DIAGNOSIS — R5383 Other fatigue: Secondary | ICD-10-CM | POA: Diagnosis not present

## 2022-07-04 DIAGNOSIS — K5900 Constipation, unspecified: Secondary | ICD-10-CM | POA: Diagnosis not present

## 2022-07-04 DIAGNOSIS — D500 Iron deficiency anemia secondary to blood loss (chronic): Secondary | ICD-10-CM

## 2022-07-04 DIAGNOSIS — I10 Essential (primary) hypertension: Secondary | ICD-10-CM | POA: Diagnosis not present

## 2022-07-04 DIAGNOSIS — J449 Chronic obstructive pulmonary disease, unspecified: Secondary | ICD-10-CM | POA: Diagnosis not present

## 2022-07-04 MED ORDER — SODIUM CHLORIDE 0.9 % IV SOLN: Freq: Once | INTRAVENOUS | Status: AC

## 2022-07-04 MED ORDER — SODIUM CHLORIDE 0.9 % IV SOLN
510.0000 mg | Freq: Once | INTRAVENOUS | Status: AC
  Administered 2022-07-04: 510 mg via INTRAVENOUS
  Filled 2022-07-04: qty 510

## 2022-07-04 NOTE — Patient Instructions (Signed)

## 2022-07-11 ENCOUNTER — Inpatient Hospital Stay: Payer: Medicare Other

## 2022-07-11 VITALS — BP 138/60 | HR 58 | Temp 98.0°F | Resp 18 | Ht 71.0 in | Wt 187.5 lb

## 2022-07-11 DIAGNOSIS — K5900 Constipation, unspecified: Secondary | ICD-10-CM | POA: Diagnosis not present

## 2022-07-11 DIAGNOSIS — J449 Chronic obstructive pulmonary disease, unspecified: Secondary | ICD-10-CM | POA: Diagnosis not present

## 2022-07-11 DIAGNOSIS — R059 Cough, unspecified: Secondary | ICD-10-CM | POA: Diagnosis not present

## 2022-07-11 DIAGNOSIS — D500 Iron deficiency anemia secondary to blood loss (chronic): Secondary | ICD-10-CM

## 2022-07-11 DIAGNOSIS — I10 Essential (primary) hypertension: Secondary | ICD-10-CM | POA: Diagnosis not present

## 2022-07-11 DIAGNOSIS — D509 Iron deficiency anemia, unspecified: Secondary | ICD-10-CM | POA: Diagnosis not present

## 2022-07-11 DIAGNOSIS — R5383 Other fatigue: Secondary | ICD-10-CM | POA: Diagnosis not present

## 2022-07-11 MED ORDER — SODIUM CHLORIDE 0.9 % IV SOLN: Freq: Once | INTRAVENOUS | Status: AC

## 2022-07-11 MED ORDER — SODIUM CHLORIDE 0.9 % IV SOLN
510.0000 mg | Freq: Once | INTRAVENOUS | Status: AC
  Administered 2022-07-11: 510 mg via INTRAVENOUS
  Filled 2022-07-11: qty 510

## 2022-07-11 NOTE — Patient Instructions (Signed)

## 2022-07-22 DIAGNOSIS — D519 Vitamin B12 deficiency anemia, unspecified: Secondary | ICD-10-CM | POA: Diagnosis not present

## 2022-08-22 DIAGNOSIS — D519 Vitamin B12 deficiency anemia, unspecified: Secondary | ICD-10-CM | POA: Diagnosis not present

## 2022-09-22 DIAGNOSIS — D519 Vitamin B12 deficiency anemia, unspecified: Secondary | ICD-10-CM | POA: Diagnosis not present

## 2022-09-28 ENCOUNTER — Other Ambulatory Visit: Payer: Self-pay | Admitting: Oncology

## 2022-09-28 DIAGNOSIS — D500 Iron deficiency anemia secondary to blood loss (chronic): Secondary | ICD-10-CM

## 2022-09-28 NOTE — Progress Notes (Signed)
[facility]  [address] [place],  [place]  [address] [phone]  Clinic Day: 09/29/2022  Referring physician: Julianne Handler, NP   HISTORY OF PRESENT ILLNESS:  The patient is a 88 y.o. male  who recently began seeing for iron deficiency anemia.   He comes in today to reassess his iron and hemoglobin levels after recently receiving IV iron.  His wife has noticed an improvement in how he has felt since his IV iron was given. The patient continues to deny having any overt forms of blood loss to explain his iron deficiency anemia.  PHYSICAL EXAM:  Blood pressure 132/74, pulse (!) 51, temperature 98.8 F (37.1 C), resp. rate 16, height 5\' 11"  (1.803 m), weight 191 lb (86.6 kg), SpO2 96 %. Wt Readings from Last 3 Encounters:  09/29/22 191 lb (86.6 kg)  07/11/22 187 lb 8 oz (85 kg)  07/04/22 189 lb 1.3 oz (85.8 kg)   Body mass index is 26.64 kg/m. Performance status (ECOG): 2 - Symptomatic, <50% confined to bed Physical Exam Constitutional:      Appearance: Normal appearance. He is not ill-appearing.  HENT:     Mouth/Throat:     Mouth: Mucous membranes are moist.     Pharynx: Oropharynx is clear. No oropharyngeal exudate or posterior oropharyngeal erythema.  Cardiovascular:     Rate and Rhythm: Normal rate and regular rhythm.     Heart sounds: No murmur heard.    No friction rub. No gallop.  Pulmonary:     Effort: Pulmonary effort is normal. No respiratory distress.     Breath sounds: Normal breath sounds. No wheezing, rhonchi or rales.  Abdominal:     General: Bowel sounds are normal. There is no distension.     Palpations: Abdomen is soft. There is no mass.     Tenderness: There is no abdominal tenderness.  Musculoskeletal:        General: No swelling.     Right lower leg: No edema.     Left lower leg: No edema.  Lymphadenopathy:     Cervical: No cervical adenopathy.     Upper Body:     Right upper body: No supraclavicular or axillary  adenopathy.     Left upper body: No supraclavicular or axillary adenopathy.     Lower Body: No right inguinal adenopathy. No left inguinal adenopathy.  Skin:    General: Skin is warm.     Coloration: Skin is not jaundiced.     Findings: No lesion or rash.  Neurological:     General: No focal deficit present.     Mental Status: He is alert and oriented to person, place, and time. Mental status is at baseline.  Psychiatric:        Mood and Affect: Mood normal.        Behavior: Behavior normal.        Thought Content: Thought content normal.    LABS:      Latest Ref Rng & Units 09/29/2022   12:00 AM 06/30/2022   12:00 AM 04/13/2019    3:02 PM  CBC  WBC  6.1     7.0     5.6   Hemoglobin 13.5 - 17.5 12.4     10.3     11.8   Hematocrit 41 - 53 38     33     37.8   Platelets 150 - 400 K/uL 177     225  219.0      This result is from an external source.      Latest Ref Rng & Units 06/30/2022    2:32 PM 04/13/2019    3:02 PM  CMP  Glucose 70 - 99 mg/dL 82  161   BUN 8 - 23 mg/dL 29  24   Creatinine 0.96 - 1.24 mg/dL 0.45  4.09   Sodium 811 - 145 mmol/L 141  140   Potassium 3.5 - 5.1 mmol/L 4.7  4.5   Chloride 98 - 111 mmol/L 107  106   CO2 22 - 32 mmol/L 28  27   Calcium 8.9 - 10.3 mg/dL 9.0  9.0   Total Protein 6.5 - 8.1 g/dL 6.9    Total Bilirubin 0.3 - 1.2 mg/dL 0.6    Alkaline Phos 38 - 126 U/L 78    AST 15 - 41 U/L 20    ALT 0 - 44 U/L 16      Latest Reference Range & Units 06/30/22 14:33 09/29/22 13:25  Iron 45 - 182 ug/dL 34 (L) 77  UIBC ug/dL 914 782  TIBC 956 - 213 ug/dL 086 (H) 578  Saturation Ratios 17.9 - 39.5 % 7 (L) 23  Ferritin 24 - 336 ng/mL 9 (L) 55  (L): Data is abnormally low (H): Data is abnormally high  ASSESSMENT & PLAN:  An 88 y.o. male with iron deficiency anemia.  Although not completely normal, this gentleman's hemoglobin has improved over 2 g since receiving his IV iron.  He clinically looks much better than what he did at his last visit.  Due  to how severely low his iron parameters were, I did talk to him again about seeing his GI physician to ensure there is no ominous GI tract pathology factoring into his severe iron deficiency anemia.  He has agreed to have this evaluation.  We have placed a referral for him to be seen by his GI physician before the end of this calendar month.  Otherwise, I will see this patient back in 4 months for repeat clinical assessment.  The patient understands all the plans discussed today and is in agreement with them.  Mahmood Boehringer Kirby Funk, MD

## 2022-09-29 ENCOUNTER — Inpatient Hospital Stay: Payer: Medicare Other

## 2022-09-29 ENCOUNTER — Other Ambulatory Visit: Payer: Self-pay | Admitting: Oncology

## 2022-09-29 ENCOUNTER — Inpatient Hospital Stay: Payer: Medicare Other | Attending: Oncology | Admitting: Oncology

## 2022-09-29 VITALS — BP 132/74 | HR 51 | Temp 98.8°F | Resp 16 | Ht 71.0 in | Wt 191.0 lb

## 2022-09-29 DIAGNOSIS — D500 Iron deficiency anemia secondary to blood loss (chronic): Secondary | ICD-10-CM

## 2022-09-29 DIAGNOSIS — D509 Iron deficiency anemia, unspecified: Secondary | ICD-10-CM | POA: Diagnosis not present

## 2022-09-29 DIAGNOSIS — D649 Anemia, unspecified: Secondary | ICD-10-CM | POA: Diagnosis not present

## 2022-09-29 LAB — IRON AND TIBC
Iron: 77 ug/dL (ref 45–182)
Saturation Ratios: 23 % (ref 17.9–39.5)
TIBC: 340 ug/dL (ref 250–450)
UIBC: 263 ug/dL

## 2022-09-29 LAB — CBC AND DIFFERENTIAL
HCT: 38 — AB (ref 41–53)
Hemoglobin: 12.4 — AB (ref 13.5–17.5)
Neutrophils Absolute: 4.21
Platelets: 177 10*3/uL (ref 150–400)
WBC: 6.1

## 2022-09-29 LAB — FERRITIN: Ferritin: 55 ng/mL (ref 24–336)

## 2022-09-29 LAB — CBC: RBC: 4.14 (ref 3.87–5.11)

## 2022-09-30 ENCOUNTER — Telehealth: Payer: Self-pay

## 2022-09-30 ENCOUNTER — Encounter: Payer: Self-pay | Admitting: Oncology

## 2022-09-30 DIAGNOSIS — Z9981 Dependence on supplemental oxygen: Secondary | ICD-10-CM | POA: Diagnosis not present

## 2022-09-30 DIAGNOSIS — D500 Iron deficiency anemia secondary to blood loss (chronic): Secondary | ICD-10-CM | POA: Diagnosis not present

## 2022-09-30 DIAGNOSIS — J449 Chronic obstructive pulmonary disease, unspecified: Secondary | ICD-10-CM | POA: Diagnosis not present

## 2022-09-30 DIAGNOSIS — J9611 Chronic respiratory failure with hypoxia: Secondary | ICD-10-CM | POA: Diagnosis not present

## 2022-09-30 DIAGNOSIS — Z0000 Encounter for general adult medical examination without abnormal findings: Secondary | ICD-10-CM | POA: Diagnosis not present

## 2022-09-30 NOTE — Telephone Encounter (Signed)
    Latest Reference Range & Units 06/30/22 14:33 09/29/22 13:25  Iron 45 - 182 ug/dL 34 (L) 77  UIBC ug/dL 409 811  TIBC 914 - 782 ug/dL 956 (H) 213  Saturation Ratios 17.9 - 39.5 % 7 (L) 23  Ferritin 24 - 336 ng/mL 9 (L) 55  (L): Data is abnormally low (H): Data is abnormally high   ASSESSMENT & PLAN:  An 88 y.o. male with iron deficiency anemia.  Although not completely normal, this gentleman's hemoglobin has improved over 2 g since receiving his IV iron.  He clinically looks much better than what he did at his last visit.  Due to how severely low his iron parameters were, I did talk to him again about seeing his GI physician to ensure there is no ominous GI tract pathology factoring into his severe iron deficiency anemia.  He has agreed to have this evaluation.  We have placed a referral for him to be seen by his GI physician before the end of this calendar month.  Otherwise, I will see this patient back in 4 months for repeat clinical assessment.  The patient understands all the plans discussed today and is in agreement with them.   Dequincy Kirby Funk, MD

## 2022-10-01 ENCOUNTER — Telehealth: Payer: Self-pay | Admitting: Oncology

## 2022-10-01 NOTE — Telephone Encounter (Signed)
Patient has been scheduled. Aware of appt date and time    Scheduling Message Entered by Rennis Harding A on 09/29/2022 at  5:11 PM Priority: Routine  <No visit type provided>  Department: [facility]  Provider:  Scheduling Notes:  LABS/APPT ON 01-30-23

## 2022-10-07 DIAGNOSIS — K219 Gastro-esophageal reflux disease without esophagitis: Secondary | ICD-10-CM | POA: Diagnosis not present

## 2022-10-07 DIAGNOSIS — D509 Iron deficiency anemia, unspecified: Secondary | ICD-10-CM | POA: Diagnosis not present

## 2022-10-07 DIAGNOSIS — R1310 Dysphagia, unspecified: Secondary | ICD-10-CM | POA: Diagnosis not present

## 2022-10-07 DIAGNOSIS — Z1211 Encounter for screening for malignant neoplasm of colon: Secondary | ICD-10-CM | POA: Diagnosis not present

## 2022-10-07 DIAGNOSIS — K449 Diaphragmatic hernia without obstruction or gangrene: Secondary | ICD-10-CM | POA: Diagnosis not present

## 2022-10-08 ENCOUNTER — Other Ambulatory Visit: Payer: Self-pay | Admitting: Pulmonary Disease

## 2022-10-09 ENCOUNTER — Encounter (HOSPITAL_BASED_OUTPATIENT_CLINIC_OR_DEPARTMENT_OTHER): Payer: Self-pay | Admitting: Pulmonary Disease

## 2022-10-09 ENCOUNTER — Ambulatory Visit (INDEPENDENT_AMBULATORY_CARE_PROVIDER_SITE_OTHER): Payer: Medicare Other | Admitting: Pulmonary Disease

## 2022-10-09 DIAGNOSIS — Z79899 Other long term (current) drug therapy: Secondary | ICD-10-CM | POA: Diagnosis not present

## 2022-10-09 DIAGNOSIS — J9611 Chronic respiratory failure with hypoxia: Secondary | ICD-10-CM

## 2022-10-09 DIAGNOSIS — R5381 Other malaise: Secondary | ICD-10-CM | POA: Diagnosis not present

## 2022-10-09 DIAGNOSIS — J449 Chronic obstructive pulmonary disease, unspecified: Secondary | ICD-10-CM | POA: Diagnosis not present

## 2022-10-09 MED ORDER — SPIRIVA RESPIMAT 2.5 MCG/ACT IN AERS: 2.0000 | INHALATION_SPRAY | Freq: Every day | RESPIRATORY_TRACT | 5 refills | Status: DC

## 2022-10-09 MED ORDER — BUDESONIDE-FORMOTEROL FUMARATE 160-4.5 MCG/ACT IN AERO: 2.0000 | INHALATION_SPRAY | Freq: Two times a day (BID) | RESPIRATORY_TRACT | 5 refills | Status: DC

## 2022-10-09 NOTE — Patient Instructions (Addendum)
End-stage COPD CONTINUE budesonide (Pulmicort) nebulizer twice a day  >Consider cutting back to once a day CONITNUE Ipratropium-Albuterol (Duonebs) q6h AS NEEDED for shortness of breath or wheezing CONTINUE Spiriva 2.5 mcg TWO puffs ONCE a day.  CONTINUE Symbicort 160-4.5 mcg TWO puffs TWICE a day.  CONTINUE Proair AS NEEDED for shortness of breath and wheezing    Chronic hypoxemic respiratory failure secondary to COPD CONTINUE oxygen 5L pulsed to maintain levels >88% with activity and sleep  GOC Previously discussed goals of care including wish for CPR and ventilator.  Please continue discussions at home Currently full code

## 2022-10-09 NOTE — Progress Notes (Unsigned)
Subjective:   PATIENT ID: Mike Mclaughlin GENDER: male DOB: 09/06/1934, MRN: [id number]   HPI  Chief Complaint  Patient presents with   Follow-up    Pt states he has been doing well. Pt has been using neb and inhalers.    Reason for Visit: Follow-up   Mike Mclaughlin is a 88 year old male with chronic hypoxemic respiratory failure secondary to COPD, bradycardia, probable pulmonary hypertension and chronic diastolic heart failure who presents for follow-up.  Synopsis: 2021 - Established care with [facility]. Started on Spiriva in January. Compliant on both Symbicort and Spiriva. On home oxygen nightly. Completed Pulmonary Rehab in [place] 2022 - Continued maintenance rehab program. Started requiring O2 with activity. Had COVID in July 2023 - Outpatient COPD exacerbation in Oct. SOB with exertion. On Symbicort and Spiriva and Duonebs TID. Compliant with oxygen with exertion  04/07/22 Wife is present and provides additional history. Since our last visit he has reported compliant oxygen. Has been short of breath. Some cough. No wheezing. On Symbicort and Spiriva. Using Duonebs four times a day which is drying out his mouth. Has not started Pulmicort daily. Exercising twice a week. He is willing to participate in Pulmonary rehab but wife worries about his oxygen compliance during transportation.  06/10/22 Wife present and provides additional history. More compliant with oxygen during the day and wearing it nightly. Inogen battery is not lasting long enough to last through church service unless they are able to sit next to an outlet. For his bronchodilator regimen he is currently on Pulmicort twice a day and Duonebs 3-4x a day. Using Symbicort and Spiriva as well. Will use albuterol on average twice a day for shortness of breath. Denies cough and wheezing. He is increasing his activity at home and is enrolled in pulmonary rehab but has had to miss class due to various reasons. Prefers to  exercise on his own.  10/09/22 Wife present to provide additional history. He reports improved energy after receiving iron infusions. He was previously nebulizer four times a day, now only albuterol 2-3 times. Compliant with Symbicort and Spiriva. Able to active and exercise twice a week  Social History: 1/2ppd x 20 years. Quit 2013.  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  Past Medical History:  Diagnosis Date   Arthritis    Cardiac dysrhythmia, unspecified    Chronic pain of right knee 09/04/2017   Chronic respiratory failure with hypoxia 11/14/2015          COPD GOLD III 11/29/2013       DOE (dyspnea on exertion) 10/06/2018   Dyslipidemia    Esophageal reflux    H/O sinus bradycardia    Hypertension    Insomnia, unspecified    Malaise and fatigue 10/06/2018   Medication management 10/13/2019   Mixed hyperlipidemia    Physical deconditioning 07/12/2019   Rheumatism    Sinus bradycardia 09/04/2017   Skin cancer, basal cell    Solitary pulmonary nodule 11/29/2013   L lingula 5mm 10/2013.  Rescan 10/2014>>>no change in nodule,  Final CT 10/19/15 Rosalita Levan) > no change so no dedicated f/u rec    Status post right hip replacement 11/18/2017      Outpatient Medications Prior to Visit  Medication Sig Dispense Refill   albuterol (VENTOLIN HFA) 108 (90 Base) MCG/ACT inhaler INHALE 1 PUFF INTO THE LUNGS EVERY 6 HOURS AS NEEDED FOR WHEEZING OR SHORTNESS OF BREATH. 17 each 5   aspirin 81 MG tablet Take  81 mg by mouth daily.     budesonide (PULMICORT) 0.5 MG/2ML nebulizer solution Take 2 mLs (0.5 mg total) by nebulization daily. 60 mL 5   cholecalciferol 25 MCG (1000 UT) tablet Take 1,000 Units by mouth daily.     Cyanocobalamin (B-12 COMPLIANCE INJECTION) 1000 MCG/ML KIT Inject as directed.     ipratropium-albuterol (DUONEB) 0.5-2.5 (3) MG/3ML SOLN Take 3 mLs by nebulization every 6 (six) hours as needed. 120 mL 11   ipratropium-albuterol (DUONEB)  0.5-2.5 (3) MG/3ML SOLN Take 3 mLs by nebulization.     Multiple Vitamin (MULTIVITAMIN) tablet Take 1 tablet by mouth daily.     omeprazole (PRILOSEC) 20 MG capsule Take 20 mg by mouth daily.     OXYGEN Place 3 L into the nose daily. 2.5 lpm with sleep     polyethylene glycol (MIRALAX / GLYCOLAX) 17 g packet Take 17 g by mouth daily.     Spacer/Aero-Holding Chambers (OPTICHAMBER DIAMOND-LG MASK) DEVI Use with inhaler 1 each 0   SYMBICORT 160-4.5 MCG/ACT inhaler INHALE 2 PUFFS INTO THE LUNGS TWICE A DAY 10.2 each 6   Tamsulosin HCl (FLOMAX) 0.4 MG CAPS Take 0.4 mg by mouth daily.      Tiotropium Bromide Monohydrate (SPIRIVA RESPIMAT) 2.5 MCG/ACT AERS INHALE 2 PUFFS BY MOUTH INTO THE LUNGS DAILY 4 g 6   No facility-administered medications prior to visit.    Review of Systems  Constitutional:  Negative for chills, diaphoresis, fever, malaise/fatigue and weight loss.  HENT:  Negative for congestion.   Respiratory:  Positive for shortness of breath. Negative for cough, hemoptysis, sputum production and wheezing.   Cardiovascular:  Negative for chest pain, palpitations and leg swelling.     Objective:   Vitals:   10/09/22 1345  BP: 128/60  Pulse: (!) 48  Temp: 97.7 F (36.5 C)  SpO2: 98%  Weight: 189 lb 3.2 oz (85.8 kg)  Height: 5\' 10"  (1.778 m)   SpO2: 98 %  Physical Exam: General: Elderly-appearing, no acute distress HENT: [place], AT Eyes: EOMI, no scleral icterus Respiratory: Diminished but clear to auscultation bilaterally.  No crackles, wheezing or rales Cardiovascular: RRR, -M/R/G, no JVD Extremities:-Edema,-tenderness Neuro: AAO x4, CNII-XII grossly intact Psych: Normal mood, normal affect  Data Reviewed:  Imaging: CT Chest 10/23/15 (report only) - Centrilobular emphysema. 5mm lingular nodule improved compared to prior imaging CT Chest 08/01/19 - Diffuse moderate centrilobular emphysema. Left lingular atelectasis  PFT: 11/14/15 FVC 2.0 (48%) FEV1 1.0 (33%) Ratio 49   Interpretation: Very severe obstructive defect present  01/17/21 FVC 2.71 (68%) FEV1 1.34 (48%) Ratio 48  TLC 103% DLCO 47% Interpretation: Severe obstructive defect with air trapping and moderately reduced DLCO consistent with emphysema. Significant bronchodilator response in FEV1 and FVC  04/08/21 FVC 2.65 (68%) FEV1 1.26 (46%) Ratio 46  TLC 107% RV 175% DLCO 42% Interpretation: Severe obstructive defect with air trapping and moderately reduced DLCO consistent with emphysema. Significant bronchodilator response with FEV1  Labs: CBC    Component Value Date/Time   WBC 6.1 09/29/2022 0000   WBC 5.6 04/13/2019 1502   RBC 4.14 09/29/2022 0000   HGB 12.4 (A) 09/29/2022 0000   HCT 38 (A) 09/29/2022 0000   PLT 177 09/29/2022 0000   MCV 85.3 04/13/2019 1502   MCHC 31.4 04/13/2019 1502   RDW 14.2 04/13/2019 1502   LYMPHSABS 1.4 04/13/2019 1502   MONOABS 0.6 04/13/2019 1502   EOSABS 0.2 04/13/2019 1502   BASOSABS 0.1 04/13/2019 1502   Absolute eos 04/13/19 -  200     Assessment & Plan:   Discussion: 88 year old male with chronic hypoxemic respiratory failure secondary to COPD, probable pulmonary HTN (WHO III) and chronic diastolic heart failure who presents for follow-up. On nebulizer therapy as well as inhalers. Discussed clinical course and management of COPD including oxygen compliance, bronchodilator regimen, preventive care including vaccinations and action plan for exacerbation. He has end stage COPD with frequent nebulizer and rescue support in additional to his triple therapy. Discussed QOL with pulmonary rehab. Patient declined preferring to exercise on his own.  End-stage COPD CONTINUE budesonide (Pulmicort) nebulizer twice a day  >Consider cutting back to once a day CONITNUE Ipratropium-Albuterol (Duonebs) q6h AS NEEDED for shortness of breath or wheezing CONTINUE Spiriva 2.5 mcg TWO puffs ONCE a day.  CONTINUE Symbicort 160-4.5 mcg TWO puffs TWICE a day.  CONTINUE Proair  AS NEEDED for shortness of breath and wheezing Declined pulmonary rehab. Previously completed in 2021. Encourage regular exercise    Chronic hypoxemic respiratory failure secondary to COPD CONTINUE oxygen 5L pulsed to maintain levels >88% with activity and sleep  GOC Previously discussed goals of care including wish for CPR and ventilator. Please continue discussions at home.  Currently full code  Hx of Nightmares/Activity --Call if you wish for sleep referral  Health Maintenance Immunization History  Administered Date(s) Administered   DTaP 06/30/2011   Fluad Quad(high Dose 65+) 01/03/2021   Influenza Nasal 12/21/2018   Influenza Split 01/01/2015   Influenza, High Dose Seasonal PF 01/09/2016, 01/12/2018, 01/12/2018, 12/26/2019   Influenza-Unspecified 12/22/2012, 12/22/2013, 12/22/2017   PFIZER(Purple Top)SARS-COV-2 Vaccination 04/05/2019, 05/06/2019, 02/01/2020   Pneumococcal Conjugate-13 09/28/2013   Pneumococcal Polysaccharide-23 05/30/2010   CT Lung Screen - not qualified  No orders of the defined types were placed in this encounter.  No orders of the defined types were placed in this encounter.  No follow-ups on file.   I have spent a total time of 32-minutes on the day of the appointment including chart review, data review, collecting history, coordinating care and discussing medical diagnosis and plan with the patient/family. Past medical history, allergies, medications were reviewed. Pertinent imaging, labs and tests included in this note have been reviewed and interpreted independently by me.  Nikiesha Milford Mechele Collin, MD [facility] 10/09/2022 2:11 PM  Office Number [phone]

## 2022-10-23 DIAGNOSIS — D519 Vitamin B12 deficiency anemia, unspecified: Secondary | ICD-10-CM | POA: Diagnosis not present

## 2022-11-07 ENCOUNTER — Other Ambulatory Visit (HOSPITAL_BASED_OUTPATIENT_CLINIC_OR_DEPARTMENT_OTHER): Payer: Self-pay | Admitting: Pulmonary Disease

## 2022-11-19 ENCOUNTER — Encounter: Payer: Self-pay | Admitting: Cardiology

## 2022-11-19 ENCOUNTER — Other Ambulatory Visit: Payer: Self-pay

## 2022-11-19 ENCOUNTER — Ambulatory Visit: Payer: Medicare Other | Attending: Cardiology | Admitting: Cardiology

## 2022-11-19 VITALS — BP 160/70 | HR 49 | Ht 70.0 in | Wt 189.0 lb

## 2022-11-19 DIAGNOSIS — R001 Bradycardia, unspecified: Secondary | ICD-10-CM | POA: Insufficient documentation

## 2022-11-19 DIAGNOSIS — E782 Mixed hyperlipidemia: Secondary | ICD-10-CM | POA: Insufficient documentation

## 2022-11-19 DIAGNOSIS — R079 Chest pain, unspecified: Secondary | ICD-10-CM | POA: Insufficient documentation

## 2022-11-19 DIAGNOSIS — I10 Essential (primary) hypertension: Secondary | ICD-10-CM | POA: Insufficient documentation

## 2022-11-19 NOTE — Patient Instructions (Signed)
Medication Instructions:  NO CHANGES  *If you need a refill on your cardiac medications before your next appointment, please call your pharmacy*  Testing/Procedures: Dr. Servando Salina has ordered a Lexiscan Myocardial Perfusion Imaging Study.  Please arrive 15 minutes prior to your appointment time for registration and insurance purposes.   The test will take approximately 3 to 4 hours to complete; you may bring reading material.  If someone comes with you to your appointment, they will need to remain in the main lobby due to limited space in the testing area. **If you are pregnant or breastfeeding, please notify the nuclear lab prior to your appointment**   How to prepare for your Myocardial Perfusion Test: Do not eat or drink 3 hours prior to your test, except you may have water. Do not consume products containing caffeine (regular or decaffeinated) 12 hours prior to your test. (ex: coffee, chocolate, sodas, tea). Do wear comfortable clothes (no dresses or overalls) and walking shoes, tennis shoes preferred (No heels or open toe shoes are allowed). Do NOT wear cologne, perfume, aftershave, or lotions (deodorant is allowed). If you use an inhaler, use it the AM of your test and bring it with you.  If you use a nebulizer, use it the AM of your test.  If these instructions are not followed, your test will have to be rescheduled.    Follow-Up: At [facility], you and your health needs are our priority.  As part of our continuing mission to provide you with exceptional heart care, we have created designated Provider Care Teams.  These Care Teams include your primary Cardiologist (physician) and Advanced Practice Providers (APPs -  Physician Assistants and Nurse Practitioners) who all work together to provide you with the care you need, when you need it.  We recommend signing up for the patient portal called "MyChart".  Sign up information is provided on this After Visit Summary.  MyChart is  used to connect with patients for Virtual Visits (Telemedicine).  Patients are able to view lab/test results, encounter notes, upcoming appointments, etc.  Non-urgent messages can be sent to your provider as well.   To learn more about what you can do with MyChart, go to [email or web address].    Your next appointment:    6 months with Dr. Servando Salina

## 2022-11-20 NOTE — Progress Notes (Signed)
Cardiology Office Note:    Date:  11/21/2022   ID:  Mike Mclaughlin, DOB 09-May-1934, MRN [id number]  PCP:  Mike Handler, NP  Cardiologist:  Mike Ripple, DO  Electrophysiologist:  None   Referring MD: Mike Handler, NP   " I am having some lightheadedness at times"  History of Present Illness:    Mike Mclaughlin is a 88 y.o. male with a hx of COPD, respiratory failure on oxygen, chronic persistent bradycardia, hypertension, hyperlipidemia and pulmonary hypertension.   The patient was seen on July 06, 2018.  At that time he appeared to be doing well from a cardiovascular standpoint.  He denied any lightheadedness or dizziness at that time.  I had his last visit on Jul 29, 2021 I placed a monitor on the patient to make sure sinus node dysfunction was not playing a role here.  His monitor does not show any evidence of significant AV blocks.  He was seen in 02/01/2022 he was at his clinical base line on oxygen for his chronic respiratory failure.   Since is visit with me he reports intermittent chest pain.     Past Medical History:  Diagnosis Date   Arthritis    Cardiac dysrhythmia, unspecified    Chronic pain of right knee 09/04/2017   Chronic respiratory failure with hypoxia 11/14/2015          COPD GOLD III 11/29/2013       DOE (dyspnea on exertion) 10/06/2018   Dyslipidemia    Esophageal reflux    H/O sinus bradycardia    Hypertension    Insomnia, unspecified    Malaise and fatigue 10/06/2018   Medication management 10/13/2019   Mixed hyperlipidemia    Physical deconditioning 07/12/2019   Rheumatism    Sinus bradycardia 09/04/2017   Skin cancer, basal cell    Solitary pulmonary nodule 11/29/2013   L lingula 5mm 10/2013.  Rescan 10/2014>>>no change in nodule,  Final CT 10/19/15 Rosalita Levan) > no change so no dedicated f/u rec    Status post right hip replacement 11/18/2017    Past Surgical History:  Procedure Laterality Date   CATARACT EXTRACTION Bilateral    COLONOSCOPY WITH  PROPOFOL N/A 08/03/2020   Procedure: COLONOSCOPY WITH PROPOFOL;  Surgeon: Jeani Hawking, MD;  Location: [facility];  Service: Endoscopy;  Laterality: N/A;   ESOPHAGOGASTRODUODENOSCOPY N/A 01/30/2017   Procedure: ESOPHAGOGASTRODUODENOSCOPY (EGD);  Surgeon: Jeani Hawking, MD;  Location: [facility];  Service: Endoscopy;  Laterality: N/A;   ESOPHAGOGASTRODUODENOSCOPY (EGD) WITH PROPOFOL N/A 08/03/2020   Procedure: ESOPHAGOGASTRODUODENOSCOPY (EGD) WITH PROPOFOL;  Surgeon: Jeani Hawking, MD;  Location: [facility];  Service: Endoscopy;  Laterality: N/A;   hemmorrhoid surgery     HEMOSTASIS CLIP PLACEMENT  08/03/2020   Procedure: HEMOSTASIS CLIP PLACEMENT;  Surgeon: Jeani Hawking, MD;  Location: [facility];  Service: Endoscopy;;   HERNIA REPAIR Bilateral    INGUINAL   HIP ARTHROPLASTY Right    HOT HEMOSTASIS N/A 08/03/2020   Procedure: HOT HEMOSTASIS (ARGON PLASMA COAGULATION/BICAP);  Surgeon: Jeani Hawking, MD;  Location: [facility];  Service: Endoscopy;  Laterality: N/A;   POLYPECTOMY      Current Medications: Current Meds  Medication Sig   albuterol (VENTOLIN HFA) 108 (90 Base) MCG/ACT inhaler INHALE 1 PUFF INTO THE LUNGS EVERY 6 HOURS AS NEEDED FOR WHEEZING OR SHORTNESS OF BREATH.   aspirin 81 MG tablet Take 81 mg by mouth daily.   budesonide (PULMICORT) 0.5 MG/2ML nebulizer solution USE 1 VIAL  IN  NEBULIZER TWICE  DAILY - Rinse mouth after treatment   budesonide-formoterol (SYMBICORT) 160-4.5 MCG/ACT inhaler Inhale 2 puffs into the lungs in the morning and at bedtime.   cholecalciferol 25 MCG (1000 UT) tablet Take 1,000 Units by mouth daily.   Cyanocobalamin (B-12 COMPLIANCE INJECTION) 1000 MCG/ML KIT Inject as directed.   ipratropium-albuterol (DUONEB) 0.5-2.5 (3) MG/3ML SOLN Take 3 mLs by nebulization every 6 (six) hours as needed.   Multiple Vitamin (MULTIVITAMIN) tablet Take 1 tablet by mouth daily.   omeprazole (PRILOSEC) 20 MG capsule Take 20 mg by mouth daily.   OXYGEN  Place 3 L into the nose daily. 2.5 lpm with sleep   polyethylene glycol (MIRALAX / GLYCOLAX) 17 g packet Take 17 g by mouth daily.   Spacer/Aero-Holding Chambers (OPTICHAMBER DIAMOND-LG MASK) DEVI Use with inhaler   Tamsulosin HCl (FLOMAX) 0.4 MG CAPS Take 0.4 mg by mouth daily.    Tiotropium Bromide Monohydrate (SPIRIVA RESPIMAT) 2.5 MCG/ACT AERS Inhale 2 puffs into the lungs daily.     Allergies:   Atorvastatin calcium [atorvastatin], Chantix [varenicline], and Other   Social History   Socioeconomic History   Marital status: Married    Spouse name: Mike Mclaughlin   Number of children: 1   Years of education: 12 + 2   Highest education level: Not on file  Occupational History   Occupation: RETIRED ACCOUNTANT FOR [employer]  Tobacco Use   Smoking status: Former    Current packs/day: 0.00    Average packs/day: 0.5 packs/day for 15.0 years (7.5 ttl pk-yrs)    Types: Cigarettes    Start date: 08/30/1998    Quit date: 08/29/2013    Years since quitting: 9.2   Smokeless tobacco: Never  Vaping Use   Vaping status: Never Used  Substance and Sexual Activity   Alcohol use: No   Drug use: No   Sexual activity: Yes  Other Topics Concern   Not on file  Social History Narrative   Married lives with wife.   Retired   International aid/development worker of [employer] Strain: Not on file  Food Insecurity: No Food Insecurity (06/30/2022)   Hunger Vital Sign    Worried About Running Out of Food in the Last Year: Never true    Ran Out of Food in the Last Year: Never true  Transportation Needs: No Transportation Needs (06/30/2022)   PRAPARE - Administrator, Civil Service (Medical): No    Lack of Transportation (Non-Medical): No  Physical Activity: Not on file  Stress: Not on file  Social Connections: Not on file     Family History: The patient's family history includes Cancer in his sister; Diverticulitis in his mother; Heart disease in his brother and sister; Other in his father;  Rheumatologic disease in his mother; Stroke in his father.  ROS:   Review of Systems  Constitution: Reports lightheadedness and dizziness.  Negative for decreased appetite, fever and weight gain.  HENT: Negative for congestion, ear discharge, hoarse voice and sore throat.   Eyes: Negative for discharge, redness, vision loss in right eye and visual halos.  Cardiovascular: Negative for chest pain, dyspnea on exertion, leg swelling, orthopnea and palpitations.  Respiratory: Negative for cough, hemoptysis, shortness of breath and snoring.   Endocrine: Negative for heat intolerance and polyphagia.  Hematologic/Lymphatic: Negative for bleeding problem. Does not bruise/bleed easily.  Skin: Negative for flushing, nail changes, rash and suspicious lesions.  Musculoskeletal: Negative for arthritis, joint pain, muscle cramps, myalgias, neck pain and stiffness.  Gastrointestinal:  Negative for abdominal pain, bowel incontinence, diarrhea and excessive appetite.  Genitourinary: Negative for decreased libido, genital sores and incomplete emptying.  Neurological: Negative for brief paralysis, focal weakness, headaches and loss of balance.  Psychiatric/Behavioral: Negative for altered mental status, depression and suicidal ideas.  Allergic/Immunologic: Negative for HIV exposure and persistent infections.    EKGs/Labs/Other Studies Reviewed:    The following studies were reviewed today:   EKG:  The ekg ordered today demonstrates sinus bradycardia heart rate 57 beats a minute.  Transthoracic echocardiogram July 2021 IMPRESSIONS   1. Left ventricular ejection fraction, by estimation, is 55 to 60%. The  left ventricle has normal function. The left ventricle has no regional  wall motion abnormalities. There is mild concentric left ventricular  hypertrophy. Left ventricular diastolic  parameters are consistent with Grade I diastolic dysfunction (impaired  relaxation). Elevated left ventricular  end-diastolic pressure.   2. Right ventricular systolic function is normal. The right ventricular  size is normal.   3. Left atrial size was moderately dilated.   4. The mitral valve is normal in structure. No evidence of mitral valve  regurgitation. No evidence of mitral stenosis.   5. The aortic valve is tricuspid. Aortic valve regurgitation is mild.  Mild aortic valve sclerosis is present, with no evidence of aortic valve  stenosis.   6. There is mild dilatation of the ascending aorta measuring 37 mm.   7. Sinus bradycardia 41-49 BPM   8. The inferior vena cava is dilated in size with >50% respiratory  variability, suggesting right atrial pressure of 8 mmHg.   FINDINGS   Left Ventricle: Left ventricular ejection fraction, by estimation, is 55  to 60%. The left ventricle has normal function. The left ventricle has no  regional wall motion abnormalities. The left ventricular internal cavity  size was normal in size. There is   mild concentric left ventricular hypertrophy. Left ventricular diastolic  parameters are consistent with Grade I diastolic dysfunction (impaired  relaxation). Elevated left ventricular end-diastolic pressure.   Right Ventricle: The right ventricular size is normal. No increase in  right ventricular wall thickness. Right ventricular systolic function is  normal. The tricuspid regurgitant velocity is 2.90 m/s, and with an  assumed right atrial pressure of 8 mmHg,  the estimated right ventricular systolic pressure is 41.6 mmHg.   Left Atrium: Left atrial size was moderately dilated.   Right Atrium: Right atrial size was normal in size.   Pericardium: There is no evidence of pericardial effusion.   Mitral Valve: The mitral valve is normal in structure. Normal mobility of  the mitral valve leaflets. No evidence of mitral valve regurgitation. No  evidence of mitral valve stenosis.   Tricuspid Valve: The tricuspid valve is normal in structure. Tricuspid  valve  regurgitation is mild . No evidence of tricuspid stenosis.   Aortic Valve: The aortic valve is tricuspid. Aortic valve regurgitation is  mild. Mild aortic valve sclerosis is present, with no evidence of aortic  valve stenosis.   Pulmonic Valve: The pulmonic valve was normal in structure. Pulmonic valve  regurgitation is not visualized. No evidence of pulmonic stenosis.   Aorta: The aortic root is normal in size and structure. There is mild  dilatation of the ascending aorta measuring 37 mm.   Venous: The inferior vena cava is dilated in size with greater than 50%  respiratory variability, suggesting right atrial pressure of 8 mmHg.   IAS/Shunts: No atrial level shunt detected by color flow Doppler.  Recent Labs: 06/30/2022: ALT 16; BUN 29; Creatinine, Ser 1.27; Potassium 4.7; Sodium 141 09/29/2022: Hemoglobin 12.4; Platelets 177  Recent Lipid Panel No results found for: "CHOL", "TRIG", "HDL", "CHOLHDL", "VLDL", "LDLCALC", "LDLDIRECT"  Physical Exam:    VS:  BP (!) 160/70 (BP Location: Left Arm, Patient Position: Sitting, Cuff Size: Normal)   Pulse (!) 49   Ht 5\' 10"  (1.778 m)   Wt 189 lb (85.7 kg)   BMI 27.12 kg/m     Wt Readings from Last 3 Encounters:  11/19/22 189 lb (85.7 kg)  10/09/22 189 lb 3.2 oz (85.8 kg)  09/29/22 191 lb (86.6 kg)     GEN: Well nourished, well developed in no acute distress HEENT: Normal NECK: No JVD; No carotid bruits LYMPHATICS: No lymphadenopathy CARDIAC: S1S2 noted,RRR, no murmurs, rubs, gallops RESPIRATORY:  Clear to auscultation without rales, wheezing or rhonchi  ABDOMEN: Soft, non-tender, non-distended, +bowel sounds, no guarding. EXTREMITIES: No edema, No cyanosis, no clubbing MUSCULOSKELETAL:  No deformity  SKIN: Warm and dry NEUROLOGIC:  Alert and oriented x 3, non-focal PSYCHIATRIC:  Normal affect, good insight  ASSESSMENT:    1. Chest pain, unspecified type   2. Primary hypertension   3. Mixed hyperlipidemia   4. Sinus  bradycardia     PLAN:     Blood pressure is acceptable, continue with current antihypertensive regimen.  Hyperlipidemia - continue with current statin medication.  The patient is in agreement with the above plan. The patient left the office in stable condition.  The patient will follow up in 1 year or sooner if needed.   Medication Adjustments/Labs and Tests Ordered: Current medicines are reviewed at length with the patient today.  Concerns regarding medicines are outlined above.  Orders Placed This Encounter  Procedures   Cardiac Stress Test: Informed Consent Details: Physician/Practitioner Attestation; Transcribe to consent form and obtain patient signature   MYOCARDIAL PERFUSION IMAGING   EKG 12-Lead   No orders of the defined types were placed in this encounter.   Patient Instructions  Medication Instructions:  NO CHANGES  *If you need a refill on your cardiac medications before your next appointment, please call your pharmacy*  Testing/Procedures: Dr. Servando Salina has ordered a Lexiscan Myocardial Perfusion Imaging Study.  Please arrive 15 minutes prior to your appointment time for registration and insurance purposes.   The test will take approximately 3 to 4 hours to complete; you may bring reading material.  If someone comes with you to your appointment, they will need to remain in the main lobby due to limited space in the testing area. **If you are pregnant or breastfeeding, please notify the nuclear lab prior to your appointment**   How to prepare for your Myocardial Perfusion Test: Do not eat or drink 3 hours prior to your test, except you may have water. Do not consume products containing caffeine (regular or decaffeinated) 12 hours prior to your test. (ex: coffee, chocolate, sodas, tea). Do wear comfortable clothes (no dresses or overalls) and walking shoes, tennis shoes preferred (No heels or open toe shoes are allowed). Do NOT wear cologne, perfume, aftershave, or  lotions (deodorant is allowed). If you use an inhaler, use it the AM of your test and bring it with you.  If you use a nebulizer, use it the AM of your test.  If these instructions are not followed, your test will have to be rescheduled.    Follow-Up: At [facility], you and your health needs are our priority.  As part  of our continuing mission to provide you with exceptional heart care, we have created designated Provider Care Teams.  These Care Teams include your primary Cardiologist (physician) and Advanced Practice Providers (APPs -  Physician Assistants and Nurse Practitioners) who all work together to provide you with the care you need, when you need it.  We recommend signing up for the patient portal called "MyChart".  Sign up information is provided on this After Visit Summary.  MyChart is used to connect with patients for Virtual Visits (Telemedicine).  Patients are able to view lab/test results, encounter notes, upcoming appointments, etc.  Non-urgent messages can be sent to your provider as well.   To learn more about what you can do with MyChart, go to [email or web address].    Your next appointment:    6 months with Dr. Servando Salina    Adopting a Healthy Lifestyle.  Know what a healthy weight is for you (roughly BMI <25) and aim to maintain this   Aim for 7+ servings of fruits and vegetables daily   65-80+ fluid ounces of water or unsweet tea for healthy kidneys   Limit to max 1 drink of alcohol per day; avoid smoking/tobacco   Limit animal fats in diet for cholesterol and heart health - choose grass fed whenever available   Avoid highly processed foods, and foods high in saturated/trans fats   Aim for low stress - take time to unwind and care for your mental health   Aim for 150 min of moderate intensity exercise weekly for heart health, and weights twice weekly for bone health   Aim for 7-9 hours of sleep daily   When it comes to diets, agreement about the  perfect plan isnt easy to find, even among the experts. Experts at the [facility] [employer] developed an idea known as the Healthy Eating Plate. Just imagine a plate divided into logical, healthy portions.   The emphasis is on diet quality:   Load up on vegetables and fruits - one-half of your plate: Aim for color and variety, and remember that potatoes dont count.   Go for whole grains - one-quarter of your plate: Whole wheat, barley, wheat berries, quinoa, oats, brown rice, and foods made with them. If you want pasta, go with whole wheat pasta.   Protein power - one-quarter of your plate: Fish, chicken, beans, and nuts are all healthy, versatile protein sources. Limit red meat.   The diet, however, does go beyond the plate, offering a few other suggestions.   Use healthy plant oils, such as olive, canola, soy, corn, sunflower and peanut. Check the labels, and avoid partially hydrogenated oil, which have unhealthy trans fats.   If youre thirsty, drink water. Coffee and tea are good in moderation, but skip sugary drinks and limit milk and dairy products to one or two daily servings.   The type of carbohydrate in the diet is more important than the amount. Some sources of carbohydrates, such as vegetables, fruits, whole grains, and beans-are healthier than others.   Finally, stay active  Signed, Mike Ripple, DO  11/21/2022 10:26 PM    [facility]

## 2022-11-21 NOTE — Telephone Encounter (Signed)
Spoke with patient and he was given detailed instructions in preparing for his STRESS TEST on 11/27/22 at 11:00.

## 2022-11-25 ENCOUNTER — Telehealth: Payer: Self-pay

## 2022-11-25 DIAGNOSIS — D519 Vitamin B12 deficiency anemia, unspecified: Secondary | ICD-10-CM | POA: Diagnosis not present

## 2022-11-25 NOTE — Telephone Encounter (Signed)
Spoke with the patient and his wife, detailed instructions given. They stated that they understood and would be here for his test. Asked to call back with any questions. S.Betzabeth Derringer EMTP/CCT

## 2022-11-27 ENCOUNTER — Ambulatory Visit: Payer: Medicare Other | Attending: Cardiology

## 2022-11-27 DIAGNOSIS — R079 Chest pain, unspecified: Secondary | ICD-10-CM | POA: Insufficient documentation

## 2022-11-27 LAB — MYOCARDIAL PERFUSION IMAGING
LV dias vol: 140 mL (ref 62–150)
LV sys vol: 65 mL
Nuc Stress EF: 54 %
Peak HR: 53 {beats}/min
Rest HR: 46 {beats}/min
Rest Nuclear Isotope Dose: 10.9 mCi
SDS: 6
SRS: 5
SSS: 11
ST Depression (mm): 0 mm
Stress Nuclear Isotope Dose: 30.1 mCi
TID: 0.85

## 2022-11-27 MED ORDER — TECHNETIUM TC 99M TETROFOSMIN IV KIT
10.9000 | PACK | Freq: Once | INTRAVENOUS | Status: AC | PRN
  Administered 2022-11-27: 10.9 via INTRAVENOUS

## 2022-11-27 MED ORDER — TECHNETIUM TC 99M TETROFOSMIN IV KIT
30.1000 | PACK | Freq: Once | INTRAVENOUS | Status: AC | PRN
  Administered 2022-11-27: 30.1 via INTRAVENOUS

## 2022-11-27 MED ORDER — REGADENOSON 0.4 MG/5ML IV SOLN
0.4000 mg | Freq: Once | INTRAVENOUS | Status: AC
  Administered 2022-11-27: 0.4 mg via INTRAVENOUS

## 2022-12-09 ENCOUNTER — Telehealth: Payer: Self-pay | Admitting: Cardiology

## 2022-12-09 NOTE — Telephone Encounter (Signed)
Wife is calling in regarding patient results. Please advise

## 2022-12-09 NOTE — Telephone Encounter (Signed)
Spoke with [name] and wife Mike Mclaughlin. Informed [name] Dr. Servando Salina would like to schedule a virtual appt to discuss results of lexi-scan. Pt said he preferred OV due to wife having appointments that day as well and not familiar with mychart e-visits.  Appt scheduled for 10/2 at 2:30pm in person. Pt had no further questions or concerns at this time.

## 2022-12-23 DIAGNOSIS — D519 Vitamin B12 deficiency anemia, unspecified: Secondary | ICD-10-CM | POA: Diagnosis not present

## 2022-12-24 ENCOUNTER — Encounter: Payer: Self-pay | Admitting: Cardiology

## 2022-12-24 ENCOUNTER — Ambulatory Visit: Payer: Medicare Other | Attending: Cardiology | Admitting: Cardiology

## 2022-12-24 ENCOUNTER — Ambulatory Visit (INDEPENDENT_AMBULATORY_CARE_PROVIDER_SITE_OTHER): Payer: Medicare Other

## 2022-12-24 VITALS — BP 158/76 | HR 41 | Ht 70.0 in | Wt 193.2 lb

## 2022-12-24 DIAGNOSIS — R001 Bradycardia, unspecified: Secondary | ICD-10-CM | POA: Insufficient documentation

## 2022-12-24 DIAGNOSIS — R42 Dizziness and giddiness: Secondary | ICD-10-CM | POA: Insufficient documentation

## 2022-12-24 DIAGNOSIS — Z79899 Other long term (current) drug therapy: Secondary | ICD-10-CM | POA: Diagnosis not present

## 2022-12-24 DIAGNOSIS — E782 Mixed hyperlipidemia: Secondary | ICD-10-CM | POA: Diagnosis not present

## 2022-12-24 DIAGNOSIS — E785 Hyperlipidemia, unspecified: Secondary | ICD-10-CM | POA: Insufficient documentation

## 2022-12-24 DIAGNOSIS — I10 Essential (primary) hypertension: Secondary | ICD-10-CM | POA: Insufficient documentation

## 2022-12-24 NOTE — Progress Notes (Signed)
Cardiology Office Note:    Date:  12/24/2022   ID:  Mike Mclaughlin, DOB Apr 01, 1934, MRN [id number]  PCP:  Julianne Handler, NP  Cardiologist:  Thomasene Ripple, DO  Electrophysiologist:  None   Referring MD: Julianne Handler, NP     History of Present Illness:    Mike Mclaughlin is a 88 y.o. male with a hx of COPD, respiratory failure, chronic persistent bradycardia, hypertension, hyperlipidemia and pulmonary hypertension.  Last visit he and his wife complain of chest discomfort.  Sent the patient for a nuclear stress test which show mild apical ischemia.  They are here today to discuss the testing result.  His wife tells me that she is worried because over the last 2 weeks his heart rate has been in the 30s.  He has had some lightheadedness.  He has not passed out.  She is concerned.  Past Medical History:  Diagnosis Date   Arthritis    Cardiac dysrhythmia, unspecified    Chronic pain of right knee 09/04/2017   Chronic respiratory failure with hypoxia 11/14/2015          COPD GOLD III 11/29/2013       DOE (dyspnea on exertion) 10/06/2018   Dyslipidemia    Esophageal reflux    H/O sinus bradycardia    Hypertension    Insomnia, unspecified    Malaise and fatigue 10/06/2018   Medication management 10/13/2019   Mixed hyperlipidemia    Physical deconditioning 07/12/2019   Rheumatism    Sinus bradycardia 09/04/2017   Skin cancer, basal cell    Solitary pulmonary nodule 11/29/2013   L lingula 5mm 10/2013.  Rescan 10/2014>>>no change in nodule,  Final CT 10/19/15 Rosalita Levan) > no change so no dedicated f/u rec    Status post right hip replacement 11/18/2017    Past Surgical History:  Procedure Laterality Date   CATARACT EXTRACTION Bilateral    COLONOSCOPY WITH PROPOFOL N/A 08/03/2020   Procedure: COLONOSCOPY WITH PROPOFOL;  Surgeon: Jeani Hawking, MD;  Location: [facility];  Service: Endoscopy;  Laterality: N/A;   ESOPHAGOGASTRODUODENOSCOPY N/A 01/30/2017   Procedure: ESOPHAGOGASTRODUODENOSCOPY  (EGD);  Surgeon: Jeani Hawking, MD;  Location: [facility];  Service: Endoscopy;  Laterality: N/A;   ESOPHAGOGASTRODUODENOSCOPY (EGD) WITH PROPOFOL N/A 08/03/2020   Procedure: ESOPHAGOGASTRODUODENOSCOPY (EGD) WITH PROPOFOL;  Surgeon: Jeani Hawking, MD;  Location: [facility];  Service: Endoscopy;  Laterality: N/A;   hemmorrhoid surgery     HEMOSTASIS CLIP PLACEMENT  08/03/2020   Procedure: HEMOSTASIS CLIP PLACEMENT;  Surgeon: Jeani Hawking, MD;  Location: [facility];  Service: Endoscopy;;   HERNIA REPAIR Bilateral    INGUINAL   HIP ARTHROPLASTY Right    HOT HEMOSTASIS N/A 08/03/2020   Procedure: HOT HEMOSTASIS (ARGON PLASMA COAGULATION/BICAP);  Surgeon: Jeani Hawking, MD;  Location: [facility];  Service: Endoscopy;  Laterality: N/A;   POLYPECTOMY      Current Medications: Current Meds  Medication Sig   albuterol (VENTOLIN HFA) 108 (90 Base) MCG/ACT inhaler INHALE 1 PUFF INTO THE LUNGS EVERY 6 HOURS AS NEEDED FOR WHEEZING OR SHORTNESS OF BREATH.   aspirin 81 MG tablet Take 81 mg by mouth daily.   budesonide (PULMICORT) 0.5 MG/2ML nebulizer solution USE 1 VIAL  IN  NEBULIZER TWICE  DAILY - Rinse mouth after treatment   budesonide-formoterol (SYMBICORT) 160-4.5 MCG/ACT inhaler Inhale 2 puffs into the lungs in the morning and at bedtime.   cholecalciferol 25 MCG (1000 UT) tablet Take 1,000 Units by mouth daily.   Cyanocobalamin (B-12 COMPLIANCE INJECTION)  1000 MCG/ML KIT Inject as directed.   ipratropium-albuterol (DUONEB) 0.5-2.5 (3) MG/3ML SOLN Take 3 mLs by nebulization every 6 (six) hours as needed.   Multiple Vitamin (MULTIVITAMIN) tablet Take 1 tablet by mouth daily.   omeprazole (PRILOSEC) 20 MG capsule Take 20 mg by mouth daily.   OXYGEN Place 3 L into the nose daily. 2.5 lpm with sleep   polyethylene glycol (MIRALAX / GLYCOLAX) 17 g packet Take 17 g by mouth daily.   Spacer/Aero-Holding Chambers (OPTICHAMBER DIAMOND-LG MASK) DEVI Use with inhaler   Tamsulosin HCl (FLOMAX) 0.4  MG CAPS Take 0.4 mg by mouth daily.    Tiotropium Bromide Monohydrate (SPIRIVA RESPIMAT) 2.5 MCG/ACT AERS Inhale 2 puffs into the lungs daily.     Allergies:   Atorvastatin calcium [atorvastatin], Chantix [varenicline], and Other   Social History   Socioeconomic History   Marital status: Married    Spouse name: ETHEL   Number of children: 1   Years of education: 12 + 2   Highest education level: Not on file  Occupational History   Occupation: RETIRED ACCOUNTANT FOR [employer]  Tobacco Use   Smoking status: Former    Current packs/day: 0.00    Average packs/day: 0.5 packs/day for 15.0 years (7.5 ttl pk-yrs)    Types: Cigarettes    Start date: 08/30/1998    Quit date: 08/29/2013    Years since quitting: 9.3   Smokeless tobacco: Never  Vaping Use   Vaping status: Never Used  Substance and Sexual Activity   Alcohol use: No   Drug use: No   Sexual activity: Yes  Other Topics Concern   Not on file  Social History Narrative   Married lives with wife.   Retired   International aid/development worker of [employer] Strain: Not on file  Food Insecurity: No Food Insecurity (06/30/2022)   Hunger Vital Sign    Worried About Running Out of Food in the Last Year: Never true    Ran Out of Food in the Last Year: Never true  Transportation Needs: No Transportation Needs (06/30/2022)   PRAPARE - Administrator, Civil Service (Medical): No    Lack of Transportation (Non-Medical): No  Physical Activity: Not on file  Stress: Not on file  Social Connections: Not on file     Family History: The patient's family history includes Cancer in his sister; Diverticulitis in his mother; Heart disease in his brother and sister; Other in his father; Rheumatologic disease in his mother; Stroke in his father.  ROS:   Review of Systems  Constitution: Negative for decreased appetite, fever and weight gain.  HENT: Negative for congestion, ear discharge, hoarse voice and sore throat.   Eyes:  Negative for discharge, redness, vision loss in right eye and visual halos.  Cardiovascular: Negative for chest pain, dyspnea on exertion, leg swelling, orthopnea and palpitations.  Respiratory: Negative for cough, hemoptysis, shortness of breath and snoring.   Endocrine: Negative for heat intolerance and polyphagia.  Hematologic/Lymphatic: Negative for bleeding problem. Does not bruise/bleed easily.  Skin: Negative for flushing, nail changes, rash and suspicious lesions.  Musculoskeletal: Negative for arthritis, joint pain, muscle cramps, myalgias, neck pain and stiffness.  Gastrointestinal: Negative for abdominal pain, bowel incontinence, diarrhea and excessive appetite.  Genitourinary: Negative for decreased libido, genital sores and incomplete emptying.  Neurological: Negative for brief paralysis, focal weakness, headaches and loss of balance.  Psychiatric/Behavioral: Negative for altered mental status, depression and suicidal ideas.  Allergic/Immunologic: Negative for HIV  exposure and persistent infections.    EKGs/Labs/Other Studies Reviewed:    The following studies were reviewed today:   EKG:  The ekg ordered today demonstrates   Recent Labs: 06/30/2022: ALT 16; BUN 29; Creatinine, Ser 1.27; Potassium 4.7; Sodium 141 09/29/2022: Hemoglobin 12.4; Platelets 177  Recent Lipid Panel No results found for: "CHOL", "TRIG", "HDL", "CHOLHDL", "VLDL", "LDLCALC", "LDLDIRECT"  Physical Exam:    VS:  BP (!) 158/76 (BP Location: Left Arm, Patient Position: Sitting, Cuff Size: Normal)   Pulse (!) 41   Ht 5\' 10"  (1.778 m)   Wt 193 lb 3.2 oz (87.6 kg)   SpO2 98%   BMI 27.72 kg/m     Wt Readings from Last 3 Encounters:  12/24/22 193 lb 3.2 oz (87.6 kg)  11/27/22 189 lb (85.7 kg)  11/19/22 189 lb (85.7 kg)     GEN: Well nourished, well developed in no acute distress HEENT: Normal NECK: No JVD; No carotid bruits LYMPHATICS: No lymphadenopathy CARDIAC: S1S2 noted,RRR, no murmurs, rubs,  gallops RESPIRATORY:  Clear to auscultation without rales, wheezing or rhonchi  ABDOMEN: Soft, non-tender, non-distended, +bowel sounds, no guarding. EXTREMITIES: No edema, No cyanosis, no clubbing MUSCULOSKELETAL:  No deformity  SKIN: Warm and dry NEUROLOGIC:  Alert and oriented x 3, non-focal PSYCHIATRIC:  Normal affect, good insight  ASSESSMENT:    1. Lightheadedness   2. Bradycardia   3. Primary hypertension   4. Dyslipidemia   5. Medication management   6. Mixed hyperlipidemia    PLAN:    Discussed the abnormal nuclear stress test.  He has declined any further diagnostic testing.  He also has declined the use of statins.  He had previous mild reaction to the Lipitor.  His wife is concerned of his heart rates last week only in the 30s.  He did admit to some lightheadedness.  Will place a monitor on the patient for 14 days.  In pulse monitor will refer the patient to EP.  The patient is in agreement with the above plan. The patient left the office in stable condition.  The patient will follow up in   Medication Adjustments/Labs and Tests Ordered: Current medicines are reviewed at length with the patient today.  Concerns regarding medicines are outlined above.  Orders Placed This Encounter  Procedures   LONG TERM MONITOR (3-14 DAYS)   No orders of the defined types were placed in this encounter.   Patient Instructions  Medication Instructions:  Your physician recommends that you continue on your current medications as directed. Please refer to the Current Medication list given to you today.  *If you need a refill on your cardiac medications before your next appointment, please call your pharmacy*   Lab Work: None  Testing/Procedures: Christena Deem- Long Term Monitor Instructions  Your physician has requested you wear a ZIO patch monitor for 14 days.  This is a single patch monitor. [employer] supplies one patch monitor per enrollment. Additional stickers are not available.  Please do not apply patch if you will be having a Nuclear Stress Test,  Echocardiogram, Cardiac CT, MRI, or Chest Xray during the period you would be wearing the  monitor. The patch cannot be worn during these tests. You cannot remove and re-apply the  ZIO XT patch monitor.  Your ZIO patch monitor will be mailed 3 day USPS to your address on file. It may take 3-5 days  to receive your monitor after you have been enrolled.  Once you have received your monitor, please  review the enclosed instructions. Your monitor  has already been registered assigning a specific monitor serial # to you.  Billing and Patient Assistance Program Information  We have supplied [employer] with any of your insurance information on file for billing purposes. [employer] offers a sliding scale Patient Assistance Program for patients that do not have  insurance, or whose insurance does not completely cover the cost of the ZIO monitor.  You must apply for the Patient Assistance Program to qualify for this discounted rate.  To apply, please call [employer] at [phone], select option 4, select option 2, ask to apply for  Patient Assistance Program. Meredeth Ide will ask your household income, and how many people  are in your household. They will quote your out-of-pocket cost based on that information.  [employer] will also be able to set up a 67-month, interest-free payment plan if needed.  Applying the monitor   Shave hair from upper left chest.  Hold abrader disc by orange tab. Rub abrader in 40 strokes over the upper left chest as  indicated in your monitor instructions.  Clean area with 4 enclosed alcohol pads. Let dry.  Apply patch as indicated in monitor instructions. Patch will be placed under collarbone on left  side of chest with arrow pointing upward.  Rub patch adhesive wings for 2 minutes. Remove Shumard label marked "1". Remove the Umana  label marked "2". Rub patch adhesive wings for 2 additional minutes.  While  looking in a mirror, press and release button in center of patch. A small green light will  flash 3-4 times. This will be your only indicator that the monitor has been turned on.  Do not shower for the first 24 hours. You may shower after the first 24 hours.  Press the button if you feel a symptom. You will hear a small click. Record Date, Time and  Symptom in the Patient Logbook.  When you are ready to remove the patch, follow instructions on the last 2 pages of Patient  Logbook. Stick patch monitor onto the last page of Patient Logbook.  Place Patient Logbook in the blue and [place] box. Use locking tab on box and tape box closed  securely. The blue and [place] box has prepaid postage on it. Please place it in the mailbox as  soon as possible. Your physician should have your test results approximately 7 days after the  monitor has been mailed back to [facility].  Call [facility] Customer Care at [phone] if you have questions regarding  your ZIO XT patch monitor. Call them immediately if you see an orange light blinking on your  monitor.  If your monitor falls off in less than 4 days, contact our Monitor department at [phone].  If your monitor becomes loose or falls off after 4 days call [employer] at [phone] for  suggestions on securing your monitor   Follow-Up: At [facility], you and your health needs are our priority.  As part of our continuing mission to provide you with exceptional heart care, we have created designated Provider Care Teams.  These Care Teams include your primary Cardiologist (physician) and Advanced Practice Providers (APPs -  Physician Assistants and Nurse Practitioners) who all work together to provide you with the care you need, when you need it.  We recommend signing up for the patient portal called "MyChart".  Sign up information is provided on this After Visit Summary.  MyChart is used to connect with patients for Virtual Visits  (Telemedicine).  Patients are able to view lab/test results, encounter notes, upcoming appointments, etc.  Non-urgent messages can be sent to your provider as well.   To learn more about what you can do with MyChart, go to [email or web address].    Your next appointment:   16 week(s)  Provider:   Thomasene Ripple, DO    Adopting a Healthy Lifestyle.  Know what a healthy weight is for you (roughly BMI <25) and aim to maintain this   Aim for 7+ servings of fruits and vegetables daily   65-80+ fluid ounces of water or unsweet tea for healthy kidneys   Limit to max 1 drink of alcohol per day; avoid smoking/tobacco   Limit animal fats in diet for cholesterol and heart health - choose grass fed whenever available   Avoid highly processed foods, and foods high in saturated/trans fats   Aim for low stress - take time to unwind and care for your mental health   Aim for 150 min of moderate intensity exercise weekly for heart health, and weights twice weekly for bone health   Aim for 7-9 hours of sleep daily   When it comes to diets, agreement about the perfect plan isnt easy to find, even among the experts. Experts at the [facility] of [employer] developed an idea known as the Healthy Eating Plate. Just imagine a plate divided into logical, healthy portions.   The emphasis is on diet quality:   Load up on vegetables and fruits - one-half of your plate: Aim for color and variety, and remember that potatoes dont count.   Go for whole grains - one-quarter of your plate: Whole wheat, barley, wheat berries, quinoa, oats, brown rice, and foods made with them. If you want pasta, go with whole wheat pasta.   Protein power - one-quarter of your plate: Fish, chicken, beans, and nuts are all healthy, versatile protein sources. Limit red meat.   The diet, however, does go beyond the plate, offering a few other suggestions.   Use healthy plant oils, such as olive, canola, soy, corn,  sunflower and peanut. Check the labels, and avoid partially hydrogenated oil, which have unhealthy trans fats.   If youre thirsty, drink water. Coffee and tea are good in moderation, but skip sugary drinks and limit milk and dairy products to one or two daily servings.   The type of carbohydrate in the diet is more important than the amount. Some sources of carbohydrates, such as vegetables, fruits, whole grains, and beans-are healthier than others.   Finally, stay active  Signed, Thomasene Ripple, DO  12/24/2022 3:21 PM    [facility]

## 2022-12-24 NOTE — Patient Instructions (Addendum)
Medication Instructions:  Your physician recommends that you continue on your current medications as directed. Please refer to the Current Medication list given to you today.  *If you need a refill on your cardiac medications before your next appointment, please call your pharmacy*   Lab Work: None  Testing/Procedures: Christena Deem- Long Term Monitor Instructions  Your physician has requested you wear a ZIO patch monitor for 14 days.  This is a single patch monitor. [employer] supplies one patch monitor per enrollment. Additional stickers are not available. Please do not apply patch if you will be having a Nuclear Stress Test,  Echocardiogram, Cardiac CT, MRI, or Chest Xray during the period you would be wearing the  monitor. The patch cannot be worn during these tests. You cannot remove and re-apply the  ZIO XT patch monitor.  Your ZIO patch monitor will be mailed 3 day USPS to your address on file. It may take 3-5 days  to receive your monitor after you have been enrolled.  Once you have received your monitor, please review the enclosed instructions. Your monitor  has already been registered assigning a specific monitor serial # to you.  Billing and Patient Assistance Program Information  We have supplied [employer] with any of your insurance information on file for billing purposes. [employer] offers a sliding scale Patient Assistance Program for patients that do not have  insurance, or whose insurance does not completely cover the cost of the ZIO monitor.  You must apply for the Patient Assistance Program to qualify for this discounted rate.  To apply, please call [employer] at [phone], select option 4, select option 2, ask to apply for  Patient Assistance Program. Meredeth Ide will ask your household income, and how many people  are in your household. They will quote your out-of-pocket cost based on that information.  [employer] will also be able to set up a 69-month, interest-free payment plan if  needed.  Applying the monitor   Shave hair from upper left chest.  Hold abrader disc by orange tab. Rub abrader in 40 strokes over the upper left chest as  indicated in your monitor instructions.  Clean area with 4 enclosed alcohol pads. Let dry.  Apply patch as indicated in monitor instructions. Patch will be placed under collarbone on left  side of chest with arrow pointing upward.  Rub patch adhesive wings for 2 minutes. Remove Jubb label marked "1". Remove the [place]  label marked "2". Rub patch adhesive wings for 2 additional minutes.  While looking in a mirror, press and release button in center of patch. A small green light will  flash 3-4 times. This will be your only indicator that the monitor has been turned on.  Do not shower for the first 24 hours. You may shower after the first 24 hours.  Press the button if you feel a symptom. You will hear a small click. Record Date, Time and  Symptom in the Patient Logbook.  When you are ready to remove the patch, follow instructions on the last 2 pages of Patient  Logbook. Stick patch monitor onto the last page of Patient Logbook.  Place Patient Logbook in the blue and Merlin box. Use locking tab on box and tape box closed  securely. The blue and [place] box has prepaid postage on it. Please place it in the mailbox as  soon as possible. Your physician should have your test results approximately 7 days after the  monitor has been mailed back to [facility].  Call  [employer] Customer Care at [phone] if you have questions regarding  your ZIO XT patch monitor. Call them immediately if you see an orange light blinking on your  monitor.  If your monitor falls off in less than 4 days, contact our Monitor department at [phone].  If your monitor becomes loose or falls off after 4 days call [employer] at [phone] for  suggestions on securing your monitor   Follow-Up: At [facility], you and your health needs are our  priority.  As part of our continuing mission to provide you with exceptional heart care, we have created designated Provider Care Teams.  These Care Teams include your primary Cardiologist (physician) and Advanced Practice Providers (APPs -  Physician Assistants and Nurse Practitioners) who all work together to provide you with the care you need, when you need it.  We recommend signing up for the patient portal called "MyChart".  Sign up information is provided on this After Visit Summary.  MyChart is used to connect with patients for Virtual Visits (Telemedicine).  Patients are able to view lab/test results, encounter notes, upcoming appointments, etc.  Non-urgent messages can be sent to your provider as well.   To learn more about what you can do with MyChart, go to [email or web address].    Your next appointment:   16 week(s)  Provider:   Thomasene Ripple, DO

## 2022-12-24 NOTE — Progress Notes (Unsigned)
Enrolled patient for a 14 day Zio XT  monitor to be mailed to patients home  °

## 2022-12-29 DIAGNOSIS — R42 Dizziness and giddiness: Secondary | ICD-10-CM | POA: Diagnosis not present

## 2022-12-29 DIAGNOSIS — R001 Bradycardia, unspecified: Secondary | ICD-10-CM

## 2023-01-05 ENCOUNTER — Other Ambulatory Visit: Payer: Self-pay | Admitting: Pulmonary Disease

## 2023-01-05 DIAGNOSIS — J449 Chronic obstructive pulmonary disease, unspecified: Secondary | ICD-10-CM

## 2023-01-13 DIAGNOSIS — R051 Acute cough: Secondary | ICD-10-CM | POA: Diagnosis not present

## 2023-01-13 DIAGNOSIS — R0600 Dyspnea, unspecified: Secondary | ICD-10-CM | POA: Diagnosis not present

## 2023-01-17 DIAGNOSIS — R42 Dizziness and giddiness: Secondary | ICD-10-CM | POA: Diagnosis not present

## 2023-01-17 DIAGNOSIS — R001 Bradycardia, unspecified: Secondary | ICD-10-CM | POA: Diagnosis not present

## 2023-01-23 DIAGNOSIS — D519 Vitamin B12 deficiency anemia, unspecified: Secondary | ICD-10-CM | POA: Diagnosis not present

## 2023-01-27 DIAGNOSIS — Z23 Encounter for immunization: Secondary | ICD-10-CM | POA: Diagnosis not present

## 2023-01-29 NOTE — Progress Notes (Signed)
[facility]  [address] [place],  [place]  [address] [phone]  Clinic Day: 01/30/2023  Referring physician: Julianne Handler, NP   HISTORY OF PRESENT ILLNESS:  The patient is a 88 y.o. male with iron deficiency anemia.   IV iron given in April 2024 led to the improvement in both his iron and hemoglobin levels.  He comes in today to reassess these parameters.  Since his last visit, the patient has been doing fairly well.  He denies having increased fatigue or any overt forms of blood loss which concern him for recurrent iron deficiency anemia.    PHYSICAL EXAM:  Blood pressure (!) 192/63, pulse (!) 58, temperature 98 F (36.7 C), resp. rate 18, height 5\' 10"  (1.778 m), weight 198 lb (89.8 kg), SpO2 99%. Wt Readings from Last 3 Encounters:  01/30/23 198 lb (89.8 kg)  12/24/22 193 lb 3.2 oz (87.6 kg)  11/27/22 189 lb (85.7 kg)   Body mass index is 28.41 kg/m. Performance status (ECOG): 2 - Symptomatic, <50% confined to bed Physical Exam Constitutional:      Appearance: Normal appearance. He is not ill-appearing.  HENT:     Mouth/Throat:     Mouth: Mucous membranes are moist.     Pharynx: Oropharynx is clear. No oropharyngeal exudate or posterior oropharyngeal erythema.  Cardiovascular:     Rate and Rhythm: Normal rate and regular rhythm.     Heart sounds: No murmur heard.    No friction rub. No gallop.  Pulmonary:     Effort: Pulmonary effort is normal. No respiratory distress.     Breath sounds: Normal breath sounds. No wheezing, rhonchi or rales.  Abdominal:     General: Bowel sounds are normal. There is no distension.     Palpations: Abdomen is soft. There is no mass.     Tenderness: There is no abdominal tenderness.  Musculoskeletal:        General: No swelling.     Right lower leg: No edema.     Left lower leg: No edema.  Lymphadenopathy:     Cervical: No cervical adenopathy.     Upper Body:     Right upper body: No  supraclavicular or axillary adenopathy.     Left upper body: No supraclavicular or axillary adenopathy.     Lower Body: No right inguinal adenopathy. No left inguinal adenopathy.  Skin:    General: Skin is warm.     Coloration: Skin is not jaundiced.     Findings: No lesion or rash.  Neurological:     General: No focal deficit present.     Mental Status: He is alert and oriented to person, place, and time. Mental status is at baseline.  Psychiatric:        Mood and Affect: Mood normal.        Behavior: Behavior normal.        Thought Content: Thought content normal.   LABS:      Latest Ref Rng & Units 01/30/2023    1:30 PM 09/29/2022   12:00 AM 06/30/2022   12:00 AM  CBC  WBC 4.0 - 10.5 K/uL 7.2  6.1     7.0      Hemoglobin 13.0 - 17.0 g/dL 84.6  96.2     95.2      Hematocrit 39.0 - 52.0 % 37.0  38     33      Platelets 150 - 400 K/uL 183  177  225         This result is from an external source.      Latest Ref Rng & Units 06/30/2022    2:32 PM 04/13/2019    3:02 PM  CMP  Glucose 70 - 99 mg/dL 82  295   BUN 8 - 23 mg/dL 29  24   Creatinine 2.84 - 1.24 mg/dL 1.32  4.40   Sodium 102 - 145 mmol/L 141  140   Potassium 3.5 - 5.1 mmol/L 4.7  4.5   Chloride 98 - 111 mmol/L 107  106   CO2 22 - 32 mmol/L 28  27   Calcium 8.9 - 10.3 mg/dL 9.0  9.0   Total Protein 6.5 - 8.1 g/dL 6.9    Total Bilirubin 0.3 - 1.2 mg/dL 0.6    Alkaline Phos 38 - 126 U/L 78    AST 15 - 41 U/L 20    ALT 0 - 44 U/L 16      Latest Reference Range & Units 09/29/22 13:25 01/30/23 13:29  Iron 45 - 182 ug/dL 77 58  UIBC ug/dL 725 366  TIBC 440 - 347 ug/dL 425 956  Saturation Ratios 17.9 - 39.5 % 23 14 (L)  Ferritin 24 - 336 ng/mL 55 49  (L): Data is abnormally low  ASSESSMENT & PLAN:  An 88 y.o. male with iron deficiency anemia.  When evaluating his labs today, his hemoglobin is slightly lower than what it was previously.  His iron parameters are slightly lower as well, but are not consistent  with  iron deficiency at this time.  Of note, past labs have shown this gentleman with mild renal insufficiency, which may be playing a role into his mild anemia.  However, as his hemoglobin is above 10-11, his hemoglobin will continue to be followed conservatively.  I will see him back in 4 months for repeat clinical assessment.  The patient understands all the plans discussed today and is in agreement with them.  Doniven Vanpatten Kirby Funk, MD

## 2023-01-30 ENCOUNTER — Telehealth: Payer: Self-pay | Admitting: Oncology

## 2023-01-30 ENCOUNTER — Other Ambulatory Visit: Payer: Self-pay | Admitting: Oncology

## 2023-01-30 ENCOUNTER — Inpatient Hospital Stay: Payer: Medicare Other

## 2023-01-30 ENCOUNTER — Inpatient Hospital Stay: Payer: Medicare Other | Attending: Oncology | Admitting: Oncology

## 2023-01-30 VITALS — BP 192/63 | HR 58 | Temp 98.0°F | Resp 18 | Ht 70.0 in | Wt 198.0 lb

## 2023-01-30 DIAGNOSIS — Z79899 Other long term (current) drug therapy: Secondary | ICD-10-CM | POA: Diagnosis not present

## 2023-01-30 DIAGNOSIS — N289 Disorder of kidney and ureter, unspecified: Secondary | ICD-10-CM | POA: Diagnosis not present

## 2023-01-30 DIAGNOSIS — D500 Iron deficiency anemia secondary to blood loss (chronic): Secondary | ICD-10-CM

## 2023-01-30 DIAGNOSIS — D509 Iron deficiency anemia, unspecified: Secondary | ICD-10-CM | POA: Insufficient documentation

## 2023-01-30 LAB — CBC WITH DIFFERENTIAL (CANCER CENTER ONLY)
Abs Immature Granulocytes: 0.07 10*3/uL (ref 0.00–0.07)
Basophils Absolute: 0.1 10*3/uL (ref 0.0–0.1)
Basophils Relative: 1 %
Eosinophils Absolute: 0.3 10*3/uL (ref 0.0–0.5)
Eosinophils Relative: 4 %
HCT: 37 % — ABNORMAL LOW (ref 39.0–52.0)
Hemoglobin: 11.6 g/dL — ABNORMAL LOW (ref 13.0–17.0)
Immature Granulocytes: 1 %
Lymphocytes Relative: 11 %
Lymphs Abs: 0.8 10*3/uL (ref 0.7–4.0)
MCH: 30.4 pg (ref 26.0–34.0)
MCHC: 31.4 g/dL (ref 30.0–36.0)
MCV: 97.1 fL (ref 80.0–100.0)
Monocytes Absolute: 0.7 10*3/uL (ref 0.1–1.0)
Monocytes Relative: 10 %
Neutro Abs: 5.2 10*3/uL (ref 1.7–7.7)
Neutrophils Relative %: 73 %
Platelet Count: 183 10*3/uL (ref 150–400)
RBC: 3.81 MIL/uL — ABNORMAL LOW (ref 4.22–5.81)
RDW: 12.9 % (ref 11.5–15.5)
WBC Count: 7.2 10*3/uL (ref 4.0–10.5)
nRBC: 0 % (ref 0.0–0.2)
nRBC: 0 /100{WBCs}

## 2023-01-30 LAB — IRON AND TIBC
Iron: 58 ug/dL (ref 45–182)
Saturation Ratios: 14 % — ABNORMAL LOW (ref 17.9–39.5)
TIBC: 405 ug/dL (ref 250–450)
UIBC: 347 ug/dL

## 2023-01-30 LAB — FERRITIN: Ferritin: 49 ng/mL (ref 24–336)

## 2023-01-30 NOTE — Telephone Encounter (Signed)
01/30/23 Spoke with patient and confirmed next appt.

## 2023-02-02 ENCOUNTER — Telehealth: Payer: Self-pay

## 2023-02-02 ENCOUNTER — Telehealth: Payer: Self-pay | Admitting: Cardiology

## 2023-02-02 NOTE — Telephone Encounter (Signed)
Spoke to patient and wife who were calling to get results from the heart monitor. Informed her results are waiting for review and will send message to provider.

## 2023-02-02 NOTE — Telephone Encounter (Signed)
Latest Reference Range & Units 01/30/23 13:29  Iron 45 - 182 ug/dL 58  UIBC ug/dL 366  TIBC 440 - 347 ug/dL 425  Saturation Ratios 17.9 - 39.5 % 14 (L)  Ferritin 24 - 336 ng/mL 49  (L): Data is abnormally low

## 2023-02-02 NOTE — Telephone Encounter (Signed)
Wife calling in for heart monitor results. Please advise

## 2023-02-09 NOTE — Telephone Encounter (Signed)
Patient's wife is following up. Please advise.

## 2023-02-09 NOTE — Telephone Encounter (Signed)
Patient identification verified by 2 forms. Marilynn Rail, RN    Called and spoke to patients wife Cordelia Pen  Informed Cordelia Pen patient scheduled for Telephone call 11/20 Cordelia Pen agrees with plan, no questions at this time

## 2023-02-09 NOTE — Telephone Encounter (Signed)
Patient identification verified by 2 forms. Marilynn Rail, RN    Called and spoke to patients wife Cardell Peach states:   -awaiting to receive heart monitor results   -it was been 4 weeks send she sent monitor to manufacture   -would also like a copy of results mailed to home, is not active on mychart  Informed Ethel message sent to Dr. Harden Mo has no further questions at this time

## 2023-02-11 ENCOUNTER — Telehealth: Payer: Self-pay

## 2023-02-11 ENCOUNTER — Ambulatory Visit: Payer: Medicare Other | Attending: Cardiology | Admitting: Cardiology

## 2023-02-11 VITALS — BP 178/79 | HR 47 | Ht 70.0 in | Wt 198.0 lb

## 2023-02-11 DIAGNOSIS — R001 Bradycardia, unspecified: Secondary | ICD-10-CM | POA: Diagnosis not present

## 2023-02-11 DIAGNOSIS — I491 Atrial premature depolarization: Secondary | ICD-10-CM | POA: Diagnosis not present

## 2023-02-11 DIAGNOSIS — J9611 Chronic respiratory failure with hypoxia: Secondary | ICD-10-CM | POA: Diagnosis not present

## 2023-02-11 DIAGNOSIS — R9439 Abnormal result of other cardiovascular function study: Secondary | ICD-10-CM | POA: Diagnosis not present

## 2023-02-11 MED ORDER — ATORVASTATIN CALCIUM 10 MG PO TABS: 10.0000 mg | ORAL_TABLET | Freq: Every day | ORAL | 0 refills | Status: DC

## 2023-02-11 NOTE — Progress Notes (Signed)
Virtual Visit via Telephone Note   Because of Mike Mclaughlin's co-morbid illnesses, he is at least at moderate risk for complications without adequate follow up.  This format is felt to be most appropriate for this patient at this time.  The patient did not have access to video technology/had technical difficulties with video requiring transitioning to audio format only (telephone).  All issues noted in this document were discussed and addressed.  No physical exam could be performed with this format.  Please refer to the patient's chart for his consent to telehealth for [name].   Date:  02/14/2023   ID:  Mike Mclaughlin, DOB 1934-07-06, MRN [id number]  Patient Location: Home Provider Location: Office/Clinic  PCP:  Julianne Handler, NP  Cardiologist:  Thomasene Ripple, DO  Electrophysiologist:  None   Evaluation Performed:  Follow-Up Visit  Chief Complaint:   " I am fine"  Virtual Visit via telephone  Note . I connected with the patient today by a telephone enabled telemedicine application and verified that I am speaking with the correct person using two identifiers.  History of Present Illness:    Mike Mclaughlin is a 88 y.o. male with COPD, chronic respiratory failure on home oxygen, chronic persistent bradycardia-asymptomatic, hypertension, hyperlipidemia, pulmonary hypertension.  Recent stress abnormal showing apical ischemia.  Patient his wife are here via telephone visit to discuss his monitor result.  The patient does not have symptoms concerning for COVID-19 infection (fever, chills, cough, or new shortness of breath).    Past Medical History:  Diagnosis Date   Arthritis    Cardiac dysrhythmia, unspecified    Chronic pain of right knee 09/04/2017   Chronic respiratory failure with hypoxia 11/14/2015          COPD GOLD III 11/29/2013       DOE (dyspnea on exertion) 10/06/2018   Dyslipidemia    Esophageal reflux    H/O sinus bradycardia    Hypertension    Insomnia,  unspecified    Malaise and fatigue 10/06/2018   Medication management 10/13/2019   Mixed hyperlipidemia    Physical deconditioning 07/12/2019   Rheumatism    Sinus bradycardia 09/04/2017   Skin cancer, basal cell    Solitary pulmonary nodule 11/29/2013   L lingula 5mm 10/2013.  Rescan 10/2014>>>no change in nodule,  Final CT 10/19/15 Rosalita Levan) > no change so no dedicated f/u rec    Status post right hip replacement 11/18/2017   Past Surgical History:  Procedure Laterality Date   CATARACT EXTRACTION Bilateral    COLONOSCOPY WITH PROPOFOL N/A 08/03/2020   Procedure: COLONOSCOPY WITH PROPOFOL;  Surgeon: Jeani Hawking, MD;  Location: [facility];  Service: Endoscopy;  Laterality: N/A;   ESOPHAGOGASTRODUODENOSCOPY N/A 01/30/2017   Procedure: ESOPHAGOGASTRODUODENOSCOPY (EGD);  Surgeon: Jeani Hawking, MD;  Location: [facility];  Service: Endoscopy;  Laterality: N/A;   ESOPHAGOGASTRODUODENOSCOPY (EGD) WITH PROPOFOL N/A 08/03/2020   Procedure: ESOPHAGOGASTRODUODENOSCOPY (EGD) WITH PROPOFOL;  Surgeon: Jeani Hawking, MD;  Location: [facility];  Service: Endoscopy;  Laterality: N/A;   hemmorrhoid surgery     HEMOSTASIS CLIP PLACEMENT  08/03/2020   Procedure: HEMOSTASIS CLIP PLACEMENT;  Surgeon: Jeani Hawking, MD;  Location: [facility];  Service: Endoscopy;;   HERNIA REPAIR Bilateral    INGUINAL   HIP ARTHROPLASTY Right    HOT HEMOSTASIS N/A 08/03/2020   Procedure: HOT HEMOSTASIS (ARGON PLASMA COAGULATION/BICAP);  Surgeon: Jeani Hawking, MD;  Location: [facility];  Service: Endoscopy;  Laterality: N/A;   POLYPECTOMY  Current Meds  Medication Sig   albuterol (VENTOLIN HFA) 108 (90 Base) MCG/ACT inhaler INHALE 1 PUFF INTO THE LUNGS EVERY 6 HOURS AS NEEDED FOR WHEEZING OR SHORTNESS OF BREATH.   aspirin 81 MG tablet Take 81 mg by mouth daily.   atorvastatin (LIPITOR) 10 MG tablet Take 1 tablet (10 mg total) by mouth daily.   budesonide (PULMICORT) 0.5 MG/2ML nebulizer solution USE 1 VIAL  IN   NEBULIZER TWICE  DAILY - Rinse mouth after treatment   budesonide-formoterol (SYMBICORT) 160-4.5 MCG/ACT inhaler Inhale 2 puffs into the lungs in the morning and at bedtime.   cholecalciferol 25 MCG (1000 UT) tablet Take 1,000 Units by mouth daily.   Cyanocobalamin (B-12 COMPLIANCE INJECTION) 1000 MCG/ML KIT Inject as directed.   ipratropium-albuterol (DUONEB) 0.5-2.5 (3) MG/3ML SOLN Take 3 mLs by nebulization every 6 (six) hours as needed.   Multiple Vitamin (MULTIVITAMIN) tablet Take 1 tablet by mouth daily.   omeprazole (PRILOSEC) 20 MG capsule Take 20 mg by mouth daily.   OXYGEN Place 3 L into the nose daily. 2.5 lpm with sleep   polyethylene glycol (MIRALAX / GLYCOLAX) 17 g packet Take 17 g by mouth daily.   Spacer/Aero-Holding Chambers (OPTICHAMBER DIAMOND-LG MASK) DEVI Use with inhaler   Tamsulosin HCl (FLOMAX) 0.4 MG CAPS Take 0.4 mg by mouth daily.    Tiotropium Bromide Monohydrate (SPIRIVA RESPIMAT) 2.5 MCG/ACT AERS Inhale 2 puffs into the lungs daily.     Allergies:   Atorvastatin calcium [atorvastatin], Chantix [varenicline], and Other   Social History   Tobacco Use   Smoking status: Former    Current packs/day: 0.00    Average packs/day: 0.5 packs/day for 15.0 years (7.5 ttl pk-yrs)    Types: Cigarettes    Start date: 08/30/1998    Quit date: 08/29/2013    Years since quitting: 9.4   Smokeless tobacco: Never  Vaping Use   Vaping status: Never Used  Substance Use Topics   Alcohol use: No   Drug use: No     Family Hx: The patient's family history includes Cancer in his sister; Diverticulitis in his mother; Heart disease in his brother and sister; Other in his father; Rheumatologic disease in his mother; Stroke in his father.  ROS:   Please see the history of present illness.     All other systems reviewed and are negative.   Prior CV studies:   The following studies were reviewed today:  ZIO monitor echocardiogram nuclear stress test  Labs/Other Tests and Data  Reviewed:    EKG:  No ECG reviewed.  Recent Labs: 06/30/2022: ALT 16; BUN 29; Creatinine, Ser 1.27; Potassium 4.7; Sodium 141 01/30/2023: Hemoglobin 11.6; Platelet Count 183   Recent Lipid Panel No results found for: "CHOL", "TRIG", "HDL", "CHOLHDL", "LDLCALC", "LDLDIRECT"  Wt Readings from Last 3 Encounters:  02/11/23 198 lb (89.8 kg)  01/30/23 198 lb (89.8 kg)  12/24/22 193 lb 3.2 oz (87.6 kg)     Objective:    Vital Signs:  BP (!) 178/79   Pulse (!) 47   Ht 5\' 10"  (1.778 m)   Wt 198 lb (89.8 kg)   BMI 28.41 kg/m      ASSESSMENT & PLAN:    Occasional PACs-symptomatic Abnormal nuclear stress test-low risk, mild apical area Chronic asymptomatic bradycardia Chronic respiratory failure with hypoxia on oxygen at home  We discussed his testing result in details.  He is asymptomatic he tells me.  At this time the patient and his wife would prefer  not to proceed with any procedures in terms of the abnormal stress test in addition to that they are both in agreement because of his chronic bradycardia we will not use medication at this time he is not that symptomatic in terms of his PACs.  Will continue to monitor.  COVID-19 Education: The signs and symptoms of COVID-19 were discussed with the patient and how to seek care for testing (follow up with PCP or arrange E-visit).  The importance of social distancing was discussed today.  Time:   Today, I have spent 12 minutes with the patient with telehealth technology discussing the above problems.     Medication Adjustments/Labs and Tests Ordered: Current medicines are reviewed at length with the patient today.  Concerns regarding medicines are outlined above.   Tests Ordered: No orders of the defined types were placed in this encounter.   Medication Changes: Meds ordered this encounter  Medications   atorvastatin (LIPITOR) 10 MG tablet    Sig: Take 1 tablet (10 mg total) by mouth daily.    Dispense:  90 each    Refill:  0     Follow Up:  In Person in 6 month(s)  Signed, Thomasene Ripple, DO  02/14/2023 1:35 PM    [facility]

## 2023-02-11 NOTE — Telephone Encounter (Signed)
Called pt, went over AVS. Pt aware mediation is cheaper with mail order. Pt verified he wanted prescription send via Mail order. No further concerns at this time.

## 2023-02-11 NOTE — Patient Instructions (Signed)
Medication Instructions:  Your physician has recommended you make the following change in your medication:  START: Lipitor 10 mg once daily *If you need a refill on your cardiac medications before your next appointment, please call your pharmacy*    Follow-Up: At [facility], you and your health needs are our priority.  As part of our continuing mission to provide you with exceptional heart care, we have created designated Provider Care Teams.  These Care Teams include your primary Cardiologist (physician) and Advanced Practice Providers (APPs -  Physician Assistants and Nurse Practitioners) who all work together to provide you with the care you need, when you need it.  We recommend signing up for the patient portal called "MyChart".  Sign up information is provided on this After Visit Summary.  MyChart is used to connect with patients for Virtual Visits (Telemedicine).  Patients are able to view lab/test results, encounter notes, upcoming appointments, etc.  Non-urgent messages can be sent to your provider as well.   To learn more about what you can do with MyChart, go to [email or web address].    Your next appointment:   6 month(s)  Provider:   Thomasene Ripple, DO

## 2023-02-23 DIAGNOSIS — D510 Vitamin B12 deficiency anemia due to intrinsic factor deficiency: Secondary | ICD-10-CM | POA: Diagnosis not present

## 2023-03-02 ENCOUNTER — Encounter: Payer: Self-pay | Admitting: Oncology

## 2023-03-16 ENCOUNTER — Telehealth: Payer: Self-pay | Admitting: Cardiology

## 2023-03-16 MED ORDER — ATORVASTATIN CALCIUM 10 MG PO TABS: 10.0000 mg | ORAL_TABLET | Freq: Every day | ORAL | 3 refills | Status: DC

## 2023-03-16 NOTE — Telephone Encounter (Signed)
*  STAT* If patient is at the pharmacy, call can be transferred to refill team.   1. Which medications need to be refilled? (please list name of each medication and dose if known)   atorvastatin (LIPITOR) 10 MG tablet     2. Would you like to learn more about the convenience, safety, & potential cost savings by using the [facility]? No   3. Are you open to using the [facility] (Type [facility].) No   4. Which pharmacy/location (including street and city if local pharmacy) is medication to be sent to?[id number] - , [place] - [address]    5. Do they need a 30 day or 90 day supply? 90 day

## 2023-03-16 NOTE — Telephone Encounter (Signed)
Refills sent to preferred pharmacy.  

## 2023-03-31 DIAGNOSIS — D519 Vitamin B12 deficiency anemia, unspecified: Secondary | ICD-10-CM | POA: Diagnosis not present

## 2023-04-14 DIAGNOSIS — N401 Enlarged prostate with lower urinary tract symptoms: Secondary | ICD-10-CM | POA: Diagnosis not present

## 2023-04-14 DIAGNOSIS — R3912 Poor urinary stream: Secondary | ICD-10-CM | POA: Diagnosis not present

## 2023-04-14 DIAGNOSIS — R3915 Urgency of urination: Secondary | ICD-10-CM | POA: Diagnosis not present

## 2023-04-17 ENCOUNTER — Encounter: Payer: Self-pay | Admitting: Cardiology

## 2023-04-17 ENCOUNTER — Ambulatory Visit: Payer: Medicare Other | Attending: Cardiology | Admitting: Cardiology

## 2023-04-17 VITALS — BP 142/68 | HR 51 | Ht 70.0 in | Wt 195.4 lb

## 2023-04-17 DIAGNOSIS — R001 Bradycardia, unspecified: Secondary | ICD-10-CM

## 2023-04-17 DIAGNOSIS — J9611 Chronic respiratory failure with hypoxia: Secondary | ICD-10-CM

## 2023-04-17 DIAGNOSIS — I10 Essential (primary) hypertension: Secondary | ICD-10-CM

## 2023-04-17 DIAGNOSIS — E785 Hyperlipidemia, unspecified: Secondary | ICD-10-CM | POA: Diagnosis not present

## 2023-04-17 NOTE — Patient Instructions (Signed)
Medication Instructions:  Your physician recommends that you continue on your current medications as directed. Please refer to the Current Medication list given to you today.  *If you need a refill on your cardiac medications before your next appointment, please call your pharmacy*   Follow-Up: At [facility], you and your health needs are our priority.  As part of our continuing mission to provide you with exceptional heart care, we have created designated Provider Care Teams.  These Care Teams include your primary Cardiologist (physician) and Advanced Practice Providers (APPs -  Physician Assistants and Nurse Practitioners) who all work together to provide you with the care you need, when you need it.  Your next appointment:   9 month(s)  Provider:   Thomasene Ripple, DO     Other Instructions:

## 2023-04-17 NOTE — Progress Notes (Signed)
Cardiology Office Note:    Date:  04/17/2023   ID:  Mike Mclaughlin, DOB 09-Aug-1934, MRN [id number]  PCP:  Julianne Handler, NP  Cardiologist:  Thomasene Ripple, DO  Electrophysiologist:  None   Referring MD: Julianne Handler, NP     History of Present Illness:    Mike Mclaughlin is a 89 y.o. male with a hx of COPD, respiratory failure, chronic persistent bradycardia, hypertension, hyperlipidemia and pulmonary hypertension.  Last visit he and his wife complain of chest discomfort.  Sent the patient for a nuclear stress test which show mild apical ischemia.  They are here today to discuss the testing result.  He is here with his wife. He offers no complaints at this time.  Past Medical History:  Diagnosis Date   Arthritis    Cardiac dysrhythmia, unspecified    Chronic pain of right knee 09/04/2017   Chronic respiratory failure with hypoxia 11/14/2015          COPD GOLD III 11/29/2013       DOE (dyspnea on exertion) 10/06/2018   Dyslipidemia    Esophageal reflux    H/O sinus bradycardia    Hypertension    Insomnia, unspecified    Malaise and fatigue 10/06/2018   Medication management 10/13/2019   Mixed hyperlipidemia    Physical deconditioning 07/12/2019   Rheumatism    Sinus bradycardia 09/04/2017   Skin cancer, basal cell    Solitary pulmonary nodule 11/29/2013   L lingula 5mm 10/2013.  Rescan 10/2014>>>no change in nodule,  Final CT 10/19/15 Rosalita Levan) > no change so no dedicated f/u rec    Status post right hip replacement 11/18/2017    Past Surgical History:  Procedure Laterality Date   CATARACT EXTRACTION Bilateral    COLONOSCOPY WITH PROPOFOL N/A 08/03/2020   Procedure: COLONOSCOPY WITH PROPOFOL;  Surgeon: Jeani Hawking, MD;  Location: [facility];  Service: Endoscopy;  Laterality: N/A;   ESOPHAGOGASTRODUODENOSCOPY N/A 01/30/2017   Procedure: ESOPHAGOGASTRODUODENOSCOPY (EGD);  Surgeon: Jeani Hawking, MD;  Location: [facility];  Service: Endoscopy;  Laterality: N/A;    ESOPHAGOGASTRODUODENOSCOPY (EGD) WITH PROPOFOL N/A 08/03/2020   Procedure: ESOPHAGOGASTRODUODENOSCOPY (EGD) WITH PROPOFOL;  Surgeon: Jeani Hawking, MD;  Location: [facility];  Service: Endoscopy;  Laterality: N/A;   hemmorrhoid surgery     HEMOSTASIS CLIP PLACEMENT  08/03/2020   Procedure: HEMOSTASIS CLIP PLACEMENT;  Surgeon: Jeani Hawking, MD;  Location: [facility];  Service: Endoscopy;;   HERNIA REPAIR Bilateral    INGUINAL   HIP ARTHROPLASTY Right    HOT HEMOSTASIS N/A 08/03/2020   Procedure: HOT HEMOSTASIS (ARGON PLASMA COAGULATION/BICAP);  Surgeon: Jeani Hawking, MD;  Location: [facility];  Service: Endoscopy;  Laterality: N/A;   POLYPECTOMY      Current Medications: Current Meds  Medication Sig   albuterol (VENTOLIN HFA) 108 (90 Base) MCG/ACT inhaler INHALE 1 PUFF INTO THE LUNGS EVERY 6 HOURS AS NEEDED FOR WHEEZING OR SHORTNESS OF BREATH.   aspirin 81 MG tablet Take 81 mg by mouth daily.   atorvastatin (LIPITOR) 10 MG tablet Take 1 tablet (10 mg total) by mouth daily.   budesonide (PULMICORT) 0.5 MG/2ML nebulizer solution USE 1 VIAL  IN  NEBULIZER TWICE  DAILY - Rinse mouth after treatment   budesonide-formoterol (SYMBICORT) 160-4.5 MCG/ACT inhaler Inhale 2 puffs into the lungs in the morning and at bedtime.   cholecalciferol 25 MCG (1000 UT) tablet Take 1,000 Units by mouth daily.   Cyanocobalamin (B-12 COMPLIANCE INJECTION) 1000 MCG/ML KIT Inject as directed.   ipratropium-albuterol (  DUONEB) 0.5-2.5 (3) MG/3ML SOLN Take 3 mLs by nebulization every 6 (six) hours as needed.   Multiple Vitamin (MULTIVITAMIN) tablet Take 1 tablet by mouth daily.   omeprazole (PRILOSEC) 20 MG capsule Take 20 mg by mouth daily.   OXYGEN Place 3 L into the nose daily. 2.5 lpm with sleep   polyethylene glycol (MIRALAX / GLYCOLAX) 17 g packet Take 17 g by mouth daily.   Spacer/Aero-Holding Chambers (OPTICHAMBER DIAMOND-LG MASK) DEVI Use with inhaler   Tamsulosin HCl (FLOMAX) 0.4 MG CAPS Take 0.4 mg  by mouth daily.    Tiotropium Bromide Monohydrate (SPIRIVA RESPIMAT) 2.5 MCG/ACT AERS Inhale 2 puffs into the lungs daily.     Allergies:   Atorvastatin calcium [atorvastatin], Chantix [varenicline], and Other   Social History   Socioeconomic History   Marital status: Married    Spouse name: ETHEL   Number of children: 1   Years of education: 12 + 2   Highest education level: Not on file  Occupational History   Occupation: RETIRED ACCOUNTANT FOR [employer]  Tobacco Use   Smoking status: Former    Current packs/day: 0.00    Average packs/day: 0.5 packs/day for 15.0 years (7.5 ttl pk-yrs)    Types: Cigarettes    Start date: 08/30/1998    Quit date: 08/29/2013    Years since quitting: 9.6   Smokeless tobacco: Never  Vaping Use   Vaping status: Never Used  Substance and Sexual Activity   Alcohol use: No   Drug use: No   Sexual activity: Yes  Other Topics Concern   Not on file  Social History Narrative   Married lives with wife.   Retired   Chief Executive Officer [employer] Strain: Not on [employer] Insecurity: No Food Insecurity (06/30/2022)   Hunger Vital Sign    Worried About Running Out of Food in the Last Year: Never true    Ran Out of Food in the Last Year: Never true  Transportation Needs: No Transportation Needs (06/30/2022)   PRAPARE - Administrator, Civil Service (Medical): No    Lack of Transportation (Non-Medical): No  Physical Activity: Not on file  Stress: Not on file  Social Connections: Not on file     Family History: The patient's family history includes Cancer in his sister; Diverticulitis in his mother; Heart disease in his brother and sister; Other in his father; Rheumatologic disease in his mother; Stroke in his father.  ROS:   Review of Systems  Constitution: Negative for decreased appetite, fever and weight gain.  HENT: Negative for congestion, ear discharge, hoarse voice and sore throat.   Eyes: Negative for discharge,  redness, vision loss in right eye and visual halos.  Cardiovascular: Negative for chest pain, dyspnea on exertion, leg swelling, orthopnea and palpitations.  Respiratory: Negative for cough, hemoptysis, shortness of breath and snoring.   Endocrine: Negative for heat intolerance and polyphagia.  Hematologic/Lymphatic: Negative for bleeding problem. Does not bruise/bleed easily.  Skin: Negative for flushing, nail changes, rash and suspicious lesions.  Musculoskeletal: Negative for arthritis, joint pain, muscle cramps, myalgias, neck pain and stiffness.  Gastrointestinal: Negative for abdominal pain, bowel incontinence, diarrhea and excessive appetite.  Genitourinary: Negative for decreased libido, genital sores and incomplete emptying.  Neurological: Negative for brief paralysis, focal weakness, headaches and loss of balance.  Psychiatric/Behavioral: Negative for altered mental status, depression and suicidal ideas.  Allergic/Immunologic: Negative for HIV exposure and persistent infections.    EKGs/Labs/Other Studies  Reviewed:    The following studies were reviewed today:   EKG:  The ekg ordered today demonstrates   Recent Labs: 06/30/2022: ALT 16; BUN 29; Creatinine, Ser 1.27; Potassium 4.7; Sodium 141 01/30/2023: Hemoglobin 11.6; Platelet Count 183  Recent Lipid Panel No results found for: "CHOL", "TRIG", "HDL", "CHOLHDL", "VLDL", "LDLCALC", "LDLDIRECT"  Physical Exam:    VS:  BP (!) 142/68   Pulse (!) 51   Ht 5\' 10"  (1.778 m)   Wt 195 lb 6.4 oz (88.6 kg)   SpO2 96%   BMI 28.04 kg/m     Wt Readings from Last 3 Encounters:  04/17/23 195 lb 6.4 oz (88.6 kg)  02/11/23 198 lb (89.8 kg)  01/30/23 198 lb (89.8 kg)     GEN: Well nourished, well developed in no acute distress HEENT: Normal NECK: No JVD; No carotid bruits LYMPHATICS: No lymphadenopathy CARDIAC: S1S2 noted,RRR, no murmurs, rubs, gallops RESPIRATORY:  Clear to auscultation without rales, wheezing or rhonchi   ABDOMEN: Soft, non-tender, non-distended, +bowel sounds, no guarding. EXTREMITIES: No edema, No cyanosis, no clubbing MUSCULOSKELETAL:  No deformity  SKIN: Warm and dry NEUROLOGIC:  Alert and oriented x 3, non-focal PSYCHIATRIC:  Normal affect, good insight  ASSESSMENT:    1. Primary hypertension   2. Sinus bradycardia   3. Chronic respiratory failure with hypoxia   4. Dyslipidemia     PLAN:    No symptoms. Still prefers not to pursue any testing due to his abnormal stress test. On lipitor and is tolerating this.  We have discussed his previous monitor results.   Continue oxygen   The patient is in agreement with the above plan. The patient left the office in stable condition.  The patient will follow up in   Medication Adjustments/Labs and Tests Ordered: Current medicines are reviewed at length with the patient today.  Concerns regarding medicines are outlined above.  No orders of the defined types were placed in this encounter.  No orders of the defined types were placed in this encounter.   Patient Instructions  Medication Instructions:  Your physician recommends that you continue on your current medications as directed. Please refer to the Current Medication list given to you today.  *If you need a refill on your cardiac medications before your next appointment, please call your pharmacy*   Follow-Up: At [facility], you and your health needs are our priority.  As part of our continuing mission to provide you with exceptional heart care, we have created designated Provider Care Teams.  These Care Teams include your primary Cardiologist (physician) and Advanced Practice Providers (APPs -  Physician Assistants and Nurse Practitioners) who all work together to provide you with the care you need, when you need it.  Your next appointment:   9 month(s)  Provider:   Thomasene Ripple, DO     Other Instructions:     Adopting a Healthy Lifestyle.  Know what a  healthy weight is for you (roughly BMI <25) and aim to maintain this   Aim for 7+ servings of fruits and vegetables daily   65-80+ fluid ounces of water or unsweet tea for healthy kidneys   Limit to max 1 drink of alcohol per day; avoid smoking/tobacco   Limit animal fats in diet for cholesterol and heart health - choose grass fed whenever available   Avoid highly processed foods, and foods high in saturated/trans fats   Aim for low stress - take time to unwind and care for your mental  health   Aim for 150 min of moderate intensity exercise weekly for heart health, and weights twice weekly for bone health   Aim for 7-9 hours of sleep daily   When it comes to diets, agreement about the perfect plan isnt easy to find, even among the experts. Experts at the [facility] of [employer] developed an idea known as the Healthy Eating Plate. Just imagine a plate divided into logical, healthy portions.   The emphasis is on diet quality:   Load up on vegetables and fruits - one-half of your plate: Aim for color and variety, and remember that potatoes dont count.   Go for whole grains - one-quarter of your plate: Whole wheat, barley, wheat berries, quinoa, oats, brown rice, and foods made with them. If you want pasta, go with whole wheat pasta.   Protein power - one-quarter of your plate: Fish, chicken, beans, and nuts are all healthy, versatile protein sources. Limit red meat.   The diet, however, does go beyond the plate, offering a few other suggestions.   Use healthy plant oils, such as olive, canola, soy, corn, sunflower and peanut. Check the labels, and avoid partially hydrogenated oil, which have unhealthy trans fats.   If youre thirsty, drink water. Coffee and tea are good in moderation, but skip sugary drinks and limit milk and dairy products to one or two daily servings.   The type of carbohydrate in the diet is more important than the amount. Some sources of carbohydrates,  such as vegetables, fruits, whole grains, and beans-are healthier than others.   Finally, stay active  Signed, Thomasene Ripple, DO  04/17/2023 9:06 PM    [facility]

## 2023-04-20 ENCOUNTER — Ambulatory Visit (INDEPENDENT_AMBULATORY_CARE_PROVIDER_SITE_OTHER): Payer: Medicare Other | Admitting: Pulmonary Disease

## 2023-04-20 ENCOUNTER — Encounter (HOSPITAL_BASED_OUTPATIENT_CLINIC_OR_DEPARTMENT_OTHER): Payer: Self-pay | Admitting: Pulmonary Disease

## 2023-04-20 DIAGNOSIS — R5381 Other malaise: Secondary | ICD-10-CM

## 2023-04-20 DIAGNOSIS — J9611 Chronic respiratory failure with hypoxia: Secondary | ICD-10-CM

## 2023-04-20 DIAGNOSIS — J449 Chronic obstructive pulmonary disease, unspecified: Secondary | ICD-10-CM | POA: Diagnosis not present

## 2023-04-20 DIAGNOSIS — Z79899 Other long term (current) drug therapy: Secondary | ICD-10-CM

## 2023-04-20 MED ORDER — SPIRIVA RESPIMAT 2.5 MCG/ACT IN AERS: 2.0000 | INHALATION_SPRAY | Freq: Every day | RESPIRATORY_TRACT | 11 refills | Status: DC

## 2023-04-20 MED ORDER — IPRATROPIUM-ALBUTEROL 0.5-2.5 (3) MG/3ML IN SOLN: 3.0000 mL | Freq: Four times a day (QID) | RESPIRATORY_TRACT | 11 refills | Status: DC | PRN

## 2023-04-20 MED ORDER — BUDESONIDE 0.5 MG/2ML IN SUSP: 0.5000 mg | Freq: Every day | RESPIRATORY_TRACT | 11 refills | Status: DC

## 2023-04-20 MED ORDER — SYMBICORT 160-4.5 MCG/ACT IN AERO: 2.0000 | INHALATION_SPRAY | Freq: Two times a day (BID) | RESPIRATORY_TRACT | 11 refills | Status: DC

## 2023-04-20 NOTE — Patient Instructions (Addendum)
CONTINUE budesonide (Pulmicort) nebulizer once a day CONITNUE Ipratropium-Albuterol (Duonebs) q6h AS NEEDED for shortness of breath or wheezing CONTINUE Spiriva 2.5 mcg TWO puffs ONCE a day.  CONTINUE Symbicort 160-4.5 mcg TWO puffs TWICE a day.  CONTINUE Proair AS NEEDED for shortness of breath and wheezing Ambulatory O2 today

## 2023-04-20 NOTE — Progress Notes (Signed)
Subjective:   PATIENT ID: Mike Mclaughlin GENDER: male DOB: 02-02-1935, MRN: [id number]   HPI  Chief Complaint  Patient presents with   Follow-up   Reason for Visit: Follow-up   [name] is a 89 year old male with chronic hypoxemic respiratory failure secondary to COPD, bradycardia, probable pulmonary hypertension and chronic diastolic heart failure who presents for follow-up.  Synopsis: 2021 - Established care with [facility]. Started on Spiriva in January. Compliant on both Symbicort and Spiriva. On home oxygen nightly. Completed Pulmonary Rehab in [place] 2022 - Continued maintenance rehab program. Started requiring O2 with activity. Had COVID in July 2023 - Outpatient COPD exacerbation in Oct. SOB with exertion. On Symbicort and Spiriva and Duonebs TID. Compliant with oxygen with exertion  04/07/22 Wife is present and provides additional history. Since our last visit he has reported compliant oxygen. Has been short of breath. Some cough. No wheezing. On Symbicort and Spiriva. Using Duonebs four times a day which is drying out his mouth. Has not started Pulmicort daily. Exercising twice a week. He is willing to participate in Pulmonary rehab but wife worries about his oxygen compliance during transportation.  06/10/22 Wife present and provides additional history. More compliant with oxygen during the day and wearing it nightly. Inogen battery is not lasting long enough to last through church service unless they are able to sit next to an outlet. For his bronchodilator regimen he is currently on Pulmicort twice a day and Duonebs 3-4x a day. Using Symbicort and Spiriva as well. Will use albuterol on average twice a day for shortness of breath. Denies cough and wheezing. He is increasing his activity at home and is enrolled in pulmonary rehab but has had to miss class due to various reasons. Prefers to exercise on his own.  10/09/22 Wife present to provide additional  history. He reports improved energy after receiving iron infusions. He was previously nebulizer four times a day, now only albuterol 2-3 times for shortness of breath. Compliant with Symbicort and Spiriva. Able to active and exercise twice a week. No exacerbations since our last visit.   04/20/23 Since our last visit he is compliant with Symbicort and Spiriva and oxygen. Compliant with oxygen at 3-4L with pulse ox 92-98%. Wearing oxygen during the day. Uses budesonide nightly and Duonebs 3-4 daily.   Social History: 1/2ppd x 20 years. Quit 2013.  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  Past Medical History:  Diagnosis Date   Arthritis    Cardiac dysrhythmia, unspecified    Chronic pain of right knee 09/04/2017   Chronic respiratory failure with hypoxia 11/14/2015          COPD GOLD III 11/29/2013       DOE (dyspnea on exertion) 10/06/2018   Dyslipidemia    Esophageal reflux    H/O sinus bradycardia    Hypertension    Insomnia, unspecified    Malaise and fatigue 10/06/2018   Medication management 10/13/2019   Mixed hyperlipidemia    Physical deconditioning 07/12/2019   Rheumatism    Sinus bradycardia 09/04/2017   Skin cancer, basal cell    Solitary pulmonary nodule 11/29/2013   L lingula 5mm 10/2013.  Rescan 10/2014>>>no change in nodule,  Final CT 10/19/15 Mike Mclaughlin) > no change so no dedicated f/u rec    Status post right hip replacement 11/18/2017      Outpatient Medications Prior to Visit  Medication Sig Dispense Refill   albuterol (VENTOLIN HFA) 108 (90  Base) MCG/ACT inhaler INHALE 1 PUFF INTO THE LUNGS EVERY 6 HOURS AS NEEDED FOR WHEEZING OR SHORTNESS OF BREATH. 17 each 5   aspirin 81 MG tablet Take 81 mg by mouth daily.     atorvastatin (LIPITOR) 10 MG tablet Take 1 tablet (10 mg total) by mouth daily. 90 tablet 3   cholecalciferol 25 MCG (1000 UT) tablet Take 1,000 Units by mouth daily.     Cyanocobalamin (B-12 COMPLIANCE INJECTION)  1000 MCG/ML KIT Inject as directed.     Multiple Vitamin (MULTIVITAMIN) tablet Take 1 tablet by mouth daily.     omeprazole (PRILOSEC) 20 MG capsule Take 20 mg by mouth daily.     OXYGEN Place 3 L into the nose daily. 2.5 lpm with sleep     polyethylene glycol (MIRALAX / GLYCOLAX) 17 g packet Take 17 g by mouth daily.     Spacer/Aero-Holding Chambers (OPTICHAMBER DIAMOND-LG MASK) DEVI Use with inhaler 1 each 0   Tamsulosin HCl (FLOMAX) 0.4 MG CAPS Take 0.4 mg by mouth daily.      budesonide (PULMICORT) 0.5 MG/2ML nebulizer solution USE 1 VIAL  IN  NEBULIZER TWICE  DAILY - Rinse mouth after treatment 60 mL 11   budesonide-formoterol (SYMBICORT) 160-4.5 MCG/ACT inhaler Inhale 2 puffs into the lungs in the morning and at bedtime. 10.2 each 5   ipratropium-albuterol (DUONEB) 0.5-2.5 (3) MG/3ML SOLN Take 3 mLs by nebulization every 6 (six) hours as needed. 120 mL 11   Tiotropium Bromide Monohydrate (SPIRIVA RESPIMAT) 2.5 MCG/ACT AERS Inhale 2 puffs into the lungs daily. 4 g 5   No facility-administered medications prior to visit.    Review of Systems  Constitutional:  Negative for chills, diaphoresis, fever, malaise/fatigue and weight loss.  HENT:  Negative for congestion.   Respiratory:  Positive for shortness of breath. Negative for cough, hemoptysis, sputum production and wheezing.   Cardiovascular:  Negative for chest pain, palpitations and leg swelling.     Objective:   Vitals:   04/20/23 1320  BP: 138/60  Pulse: (!) 52  Resp: 16  SpO2: 98%  Weight: 194 lb (88 kg)  Height: 5\' 10"  (1.778 m)   SpO2: 98 % FiO2 (%): 95 %  Physical Exam: General: Elderly appearing, no acute distress HENT: [place], AT Eyes: EOMI, no scleral icterus Respiratory: Diminished but clear to auscultation bilaterally.  No crackles, wheezing or rales Cardiovascular: RRR, -M/R/G, no JVD Extremities:-Edema,-tenderness Neuro: AAO x4, CNII-XII grossly intact Psych: Normal mood, normal affect  Data  Reviewed:  Imaging: CT Chest 10/23/15 (report only) - Centrilobular emphysema. 5mm lingular nodule improved compared to prior imaging CT Chest 08/01/19 - Diffuse moderate centrilobular emphysema. Left lingular atelectasis  PFT: 11/14/15 FVC 2.0 (48%) FEV1 1.0 (33%) Ratio 49  Interpretation: Very severe obstructive defect present  01/17/21 FVC 2.71 (68%) FEV1 1.34 (48%) Ratio 48  TLC 103% DLCO 47% Interpretation: Severe obstructive defect with air trapping and moderately reduced DLCO consistent with emphysema. Significant bronchodilator response in FEV1 and FVC  04/08/21 FVC 2.65 (68%) FEV1 1.26 (46%) Ratio 46  TLC 107% RV 175% DLCO 42% Interpretation: Severe obstructive defect with air trapping and moderately reduced DLCO consistent with emphysema. Significant bronchodilator response with FEV1  Labs: CBC    Component Value Date/Time   WBC 7.2 01/30/2023 1330   WBC 5.6 04/13/2019 1502   RBC 3.81 (L) 01/30/2023 1330   HGB 11.6 (L) 01/30/2023 1330   HCT 37.0 (L) 01/30/2023 1330   PLT 183 01/30/2023 1330   MCV 97.1  01/30/2023 1330   MCH 30.4 01/30/2023 1330   MCHC 31.4 01/30/2023 1330   RDW 12.9 01/30/2023 1330   LYMPHSABS 0.8 01/30/2023 1330   MONOABS 0.7 01/30/2023 1330   EOSABS 0.3 01/30/2023 1330   BASOSABS 0.1 01/30/2023 1330   Absolute eos 04/13/19 - 200     Assessment & Plan:   Discussion: 89 year old male with chronic hypoxemic respiratory failure secondary to COPD, probable pulmonary HTN (WHO III) and chronic diastolic heart failure who presents for follow-up. Remains on triple therapy and nebulizer therapy for end stage COPD. Discussed clinical course and management of COPD including bronchodilator regimen, preventive care and action plan for exacerbation. Symptomatic but optimized on current therapy. May need to consider ohtuvayre at next visit  End-stage COPD CONTINUE budesonide (Pulmicort) nebulizer once a day CONITNUE Ipratropium-Albuterol (Duonebs) q6h AS NEEDED  for shortness of breath or wheezing CONTINUE Spiriva 2.5 mcg TWO puffs ONCE a day.  CONTINUE Symbicort 160-4.5 mcg TWO puffs TWICE a day.  CONTINUE Proair AS NEEDED for shortness of breath and wheezing Declined pulmonary rehab. Previously completed in 2021. Encourage regular exercise    Chronic hypoxemic respiratory failure secondary to COPD Re-qualified for oxygen today Recommend continuous 3L with activity and sleep Continue pulsed O2 for goal >88%  GOC Previously discussed goals of care including wish for CPR and ventilator. Please continue discussions at home.  Currently full code  Hx of Nightmares/Activity --Call if you wish for sleep referral  Health Maintenance Immunization History  Administered Date(s) Administered   DTaP 06/30/2011   Fluad Quad(high Dose 65+) 01/03/2021   Influenza Nasal 12/21/2018   Influenza Split 01/01/2015   Influenza, High Dose Seasonal PF 01/09/2016, 01/12/2018, 01/12/2018, 12/26/2019   Influenza-Unspecified 12/22/2012, 12/22/2013, 12/22/2017   PFIZER(Purple Top)SARS-COV-2 Vaccination 04/05/2019, 05/06/2019, 02/01/2020   Pneumococcal Conjugate-13 09/28/2013   Pneumococcal Polysaccharide-23 05/30/2010   CT Lung Screen - not qualified  No orders of the defined types were placed in this encounter.  Meds ordered this encounter  Medications   budesonide (PULMICORT) 0.5 MG/2ML nebulizer solution    Sig: Take 2 mLs (0.5 mg total) by nebulization daily.    Dispense:  60 mL    Refill:  11   Tiotropium Bromide Monohydrate (SPIRIVA RESPIMAT) 2.5 MCG/ACT AERS    Sig: Inhale 2 puffs into the lungs daily.    Dispense:  4 g    Refill:  11    Lot Number?:   [id number]    Expiration Date?:   12/21/2020    Quantity:   4   SYMBICORT 160-4.5 MCG/ACT inhaler    Sig: Inhale 2 puffs into the lungs in the morning and at bedtime.    Dispense:  10.2 each    Refill:  11    Brand name    Lot Number?:   [id number]    Expiration Date?:   09/21/2016    Manufacturer?:    [employer] [[id number]]    Quantity:   1   ipratropium-albuterol (DUONEB) 0.5-2.5 (3) MG/3ML SOLN    Sig: Take 3 mLs by nebulization every 6 (six) hours as needed.    Dispense:  120 mL    Refill:  11    DX J44.9   Return in about 3 months (around 07/19/2023).   I have spent a total time of 35-minutes on the day of the appointment including chart review, data review, collecting history, coordinating care and discussing medical diagnosis and plan with the patient/family. Past medical history, allergies, medications were  reviewed. Pertinent imaging, labs and tests included in this note have been reviewed and interpreted independently by me.  Kwamane Whack Mechele Collin, MD [facility] 04/20/2023 3:28 PM

## 2023-04-27 ENCOUNTER — Telehealth (HOSPITAL_BASED_OUTPATIENT_CLINIC_OR_DEPARTMENT_OTHER): Payer: Self-pay | Admitting: Pulmonary Disease

## 2023-04-27 DIAGNOSIS — J9611 Chronic respiratory failure with hypoxia: Secondary | ICD-10-CM

## 2023-04-27 NOTE — Telephone Encounter (Signed)
Patient states the ipratropium-albuterol (DUONEB) 0.5-2.5 (3) MG/3ML SOLN [[id number]] and budesonide (PULMICORT) 0.5 MG/2ML nebulizer solution [[id number]]  need to be sent to [facility] due to patient having Medicare.   Please advise. Patient asking if this is going to be done by today advised that our clinical personnel is seeing patients all day cannot guarantee it will be done by today

## 2023-04-27 NOTE — Telephone Encounter (Signed)
 Results have been relayed to the patient/authorized caretaker. The patient/authorized caretaker verbalized understanding. No questions at this time.

## 2023-04-30 ENCOUNTER — Other Ambulatory Visit (HOSPITAL_BASED_OUTPATIENT_CLINIC_OR_DEPARTMENT_OTHER): Payer: Self-pay

## 2023-04-30 MED ORDER — BUDESONIDE 0.5 MG/2ML IN SUSP: 0.5000 mg | Freq: Every day | RESPIRATORY_TRACT | 11 refills | Status: DC

## 2023-04-30 MED ORDER — IPRATROPIUM-ALBUTEROL 0.5-2.5 (3) MG/3ML IN SOLN: 3.0000 mL | Freq: Four times a day (QID) | RESPIRATORY_TRACT | 11 refills | Status: DC | PRN

## 2023-05-04 DIAGNOSIS — D519 Vitamin B12 deficiency anemia, unspecified: Secondary | ICD-10-CM | POA: Diagnosis not present

## 2023-05-28 NOTE — Progress Notes (Signed)
 [facility]  [address] [place],  [place]  [address] [phone]  Clinic Day:  05/29/2023  Referring physician: Julianne Handler, NP   HISTORY OF PRESENT ILLNESS:  The patient is a 89 y.o. male with iron deficiency anemia.   IV iron given in April 2024 led to the improvement in both his iron and hemoglobin levels.  He comes in today to reassess these parameters.  Since his last visit, the patient has been doing fairly well.  He denies having increased fatigue or any overt forms of blood loss which concern him for recurrent iron deficiency anemia.    PHYSICAL EXAM:  Blood pressure (!) 153/65, pulse (!) 50, temperature 98.2 F (36.8 C), temperature source Oral, resp. rate 20, height 5\' 10"  (1.778 m), weight 195 lb (88.5 kg), SpO2 100%. Wt Readings from Last 3 Encounters:  05/29/23 195 lb (88.5 kg)  04/20/23 194 lb (88 kg)  04/17/23 195 lb 6.4 oz (88.6 kg)   Body mass index is 27.98 kg/m. Performance status (ECOG): 2 - Symptomatic, <50% confined to bed Physical Exam Constitutional:      Appearance: Normal appearance. He is not ill-appearing.  HENT:     Mouth/Throat:     Mouth: Mucous membranes are moist.     Pharynx: Oropharynx is clear. No oropharyngeal exudate or posterior oropharyngeal erythema.  Cardiovascular:     Rate and Rhythm: Normal rate and regular rhythm.     Heart sounds: No murmur heard.    No friction rub. No gallop.  Pulmonary:     Effort: Pulmonary effort is normal. No respiratory distress.     Breath sounds: Normal breath sounds. No wheezing, rhonchi or rales.  Abdominal:     General: Bowel sounds are normal. There is no distension.     Palpations: Abdomen is soft. There is no mass.     Tenderness: There is no abdominal tenderness.  Musculoskeletal:        General: No swelling.     Right lower leg: No edema.     Left lower leg: No edema.  Lymphadenopathy:     Cervical: No cervical adenopathy.     Upper Body:     Right  upper body: No supraclavicular or axillary adenopathy.     Left upper body: No supraclavicular or axillary adenopathy.     Lower Body: No right inguinal adenopathy. No left inguinal adenopathy.  Skin:    General: Skin is warm.     Coloration: Skin is not jaundiced.     Findings: No lesion or rash.  Neurological:     General: No focal deficit present.     Mental Status: He is alert and oriented to person, place, and time. Mental status is at baseline.  Psychiatric:        Mood and Affect: Mood normal.        Behavior: Behavior normal.        Thought Content: Thought content normal.    LABS:      Latest Ref Rng & Units 05/29/2023    1:26 PM 01/30/2023    1:30 PM 09/29/2022   12:00 AM  CBC  WBC 4.0 - 10.5 K/uL 6.0  7.2  6.1      Hemoglobin 13.0 - 17.0 g/dL 29.5  62.1  30.8      Hematocrit 39.0 - 52.0 % 37.1  37.0  38      Platelets 150 - 400 K/uL 180  183  177  This result is from an external source.      Latest Ref Rng & Units 05/29/2023    1:26 PM 06/30/2022    2:32 PM 04/13/2019    3:02 PM  CMP  Glucose 70 - 99 mg/dL 161  82  096   BUN 8 - 23 mg/dL 29  29  24    Creatinine 0.61 - 1.24 mg/dL 0.45  4.09  8.11   Sodium 135 - 145 mmol/L 140  141  140   Potassium 3.5 - 5.1 mmol/L 4.5  4.7  4.5   Chloride 98 - 111 mmol/L 104  107  106   CO2 22 - 32 mmol/L 27  28  27    Calcium 8.9 - 10.3 mg/dL 9.5  9.0  9.0   Total Protein 6.5 - 8.1 g/dL 6.5  6.9    Total Bilirubin 0.0 - 1.2 mg/dL 0.3  0.6    Alkaline Phos 38 - 126 U/L 102  78    AST 15 - 41 U/L 19  20    ALT 0 - 44 U/L 8  16      Latest Reference Range & Units 05/29/23 13:26  Iron 45 - 182 ug/dL 914  UIBC ug/dL 782  TIBC 956 - 213 ug/dL 086  Saturation Ratios 17.9 - 39.5 % 27  Ferritin 24 - 336 ng/mL 28    ASSESSMENT & PLAN:  An 89 y.o. male with iron deficiency anemia.  When evaluating his labs today, his hemoglobin is slightly lower than what it was previously.  His iron parameters remain stable.  Labs continue to   show mild renal insufficiency, which is likely contributing to his mild anemia.  However, as his hemoglobin is above 10-11, his hemoglobin will continue to be followed conservatively.  I will see him back in 4 months for repeat clinical assessment.  The patient understands all the plans discussed today and is in agreement with them.  Cheyanna Strick Kirby Funk, MD

## 2023-05-29 ENCOUNTER — Other Ambulatory Visit: Payer: Self-pay | Admitting: Oncology

## 2023-05-29 ENCOUNTER — Telehealth: Payer: Self-pay | Admitting: Oncology

## 2023-05-29 ENCOUNTER — Inpatient Hospital Stay: Payer: Medicare Other | Attending: Oncology | Admitting: Oncology

## 2023-05-29 ENCOUNTER — Inpatient Hospital Stay: Payer: Medicare Other

## 2023-05-29 VITALS — BP 153/65 | HR 50 | Temp 98.2°F | Resp 20 | Ht 70.0 in | Wt 195.0 lb

## 2023-05-29 DIAGNOSIS — R5383 Other fatigue: Secondary | ICD-10-CM | POA: Insufficient documentation

## 2023-05-29 DIAGNOSIS — N289 Disorder of kidney and ureter, unspecified: Secondary | ICD-10-CM | POA: Insufficient documentation

## 2023-05-29 DIAGNOSIS — D500 Iron deficiency anemia secondary to blood loss (chronic): Secondary | ICD-10-CM | POA: Diagnosis not present

## 2023-05-29 DIAGNOSIS — Z79899 Other long term (current) drug therapy: Secondary | ICD-10-CM | POA: Insufficient documentation

## 2023-05-29 DIAGNOSIS — D509 Iron deficiency anemia, unspecified: Secondary | ICD-10-CM | POA: Diagnosis not present

## 2023-05-29 LAB — CMP (CANCER CENTER ONLY)
ALT: 8 U/L (ref 0–44)
AST: 19 U/L (ref 15–41)
Albumin: 3.9 g/dL (ref 3.5–5.0)
Alkaline Phosphatase: 102 U/L (ref 38–126)
Anion gap: 9 (ref 5–15)
BUN: 29 mg/dL — ABNORMAL HIGH (ref 8–23)
CO2: 27 mmol/L (ref 22–32)
Calcium: 9.5 mg/dL (ref 8.9–10.3)
Chloride: 104 mmol/L (ref 98–111)
Creatinine: 1.42 mg/dL — ABNORMAL HIGH (ref 0.61–1.24)
GFR, Estimated: 48 mL/min — ABNORMAL LOW (ref >60)
Glucose, Bld: 102 mg/dL — ABNORMAL HIGH (ref 70–99)
Potassium: 4.5 mmol/L (ref 3.5–5.1)
Sodium: 140 mmol/L (ref 135–145)
Total Bilirubin: 0.3 mg/dL (ref 0.0–1.2)
Total Protein: 6.5 g/dL (ref 6.5–8.1)

## 2023-05-29 LAB — CBC WITH DIFFERENTIAL (CANCER CENTER ONLY)
Abs Immature Granulocytes: 0.03 10*3/uL (ref 0.00–0.07)
Basophils Absolute: 0.1 10*3/uL (ref 0.0–0.1)
Basophils Relative: 1 %
Eosinophils Absolute: 0.4 10*3/uL (ref 0.0–0.5)
Eosinophils Relative: 6 %
HCT: 37.1 % — ABNORMAL LOW (ref 39.0–52.0)
Hemoglobin: 11.4 g/dL — ABNORMAL LOW (ref 13.0–17.0)
Immature Granulocytes: 1 %
Lymphocytes Relative: 13 %
Lymphs Abs: 0.8 10*3/uL (ref 0.7–4.0)
MCH: 29.4 pg (ref 26.0–34.0)
MCHC: 30.7 g/dL (ref 30.0–36.0)
MCV: 95.6 fL (ref 80.0–100.0)
Monocytes Absolute: 0.6 10*3/uL (ref 0.1–1.0)
Monocytes Relative: 10 %
Neutro Abs: 4.2 10*3/uL (ref 1.7–7.7)
Neutrophils Relative %: 69 %
Platelet Count: 180 10*3/uL (ref 150–400)
RBC: 3.88 MIL/uL — ABNORMAL LOW (ref 4.22–5.81)
RDW: 12.5 % (ref 11.5–15.5)
WBC Count: 6 10*3/uL (ref 4.0–10.5)
nRBC: 0 % (ref 0.0–0.2)
nRBC: 0 /100{WBCs}

## 2023-05-29 LAB — FERRITIN: Ferritin: 28 ng/mL (ref 24–336)

## 2023-05-29 LAB — IRON AND TIBC
Iron: 104 ug/dL (ref 45–182)
Saturation Ratios: 27 % (ref 17.9–39.5)
TIBC: 392 ug/dL (ref 250–450)
UIBC: 288 ug/dL

## 2023-05-29 NOTE — Telephone Encounter (Signed)
 05/20/23 Spoke with patient and confirmed next appt

## 2023-05-30 ENCOUNTER — Encounter: Payer: Self-pay | Admitting: Oncology

## 2023-06-02 ENCOUNTER — Telehealth: Payer: Self-pay

## 2023-06-02 DIAGNOSIS — I10 Essential (primary) hypertension: Secondary | ICD-10-CM | POA: Diagnosis not present

## 2023-06-02 DIAGNOSIS — Z79899 Other long term (current) drug therapy: Secondary | ICD-10-CM | POA: Diagnosis not present

## 2023-06-02 DIAGNOSIS — J9611 Chronic respiratory failure with hypoxia: Secondary | ICD-10-CM | POA: Diagnosis not present

## 2023-06-02 DIAGNOSIS — E782 Mixed hyperlipidemia: Secondary | ICD-10-CM | POA: Diagnosis not present

## 2023-06-02 DIAGNOSIS — D519 Vitamin B12 deficiency anemia, unspecified: Secondary | ICD-10-CM | POA: Diagnosis not present

## 2023-06-02 DIAGNOSIS — E559 Vitamin D deficiency, unspecified: Secondary | ICD-10-CM | POA: Diagnosis not present

## 2023-06-02 DIAGNOSIS — J449 Chronic obstructive pulmonary disease, unspecified: Secondary | ICD-10-CM | POA: Diagnosis not present

## 2023-06-02 DIAGNOSIS — N183 Chronic kidney disease, stage 3 unspecified: Secondary | ICD-10-CM | POA: Diagnosis not present

## 2023-06-02 DIAGNOSIS — Z9981 Dependence on supplemental oxygen: Secondary | ICD-10-CM | POA: Diagnosis not present

## 2023-06-02 NOTE — Telephone Encounter (Signed)
  Latest Reference Range & Units 05/29/23 13:26   Iron 45 - 182 ug/dL 295  UIBC ug/dL 284  TIBC 132 - 440 ug/dL 102  Saturation Ratios 17.9 - 39.5 % 27  Ferritin 24 - 336 ng/mL 28      ASSESSMENT & PLAN:  An 89 y.o. male with iron deficiency anemia.  When evaluating his labs today, his hemoglobin is slightly lower than what it was previously.  His iron parameters remain stable.  Labs continue to  show mild renal insufficiency, which is likely contributing to his mild anemia.  However, as his hemoglobin is above 10-11, his hemoglobin will continue to be followed conservatively.  I will see him back in 4 months for repeat clinical assessment.  The patient understands all the plans discussed today and is in agreement with them.   Weston Settle, MD                Electronically signed by Weston Settle, MD at 05/30/2023  6:33 PM

## 2023-06-03 ENCOUNTER — Encounter: Payer: Self-pay | Admitting: Oncology

## 2023-06-16 ENCOUNTER — Other Ambulatory Visit (HOSPITAL_BASED_OUTPATIENT_CLINIC_OR_DEPARTMENT_OTHER): Payer: Self-pay | Admitting: Pulmonary Disease

## 2023-07-01 ENCOUNTER — Ambulatory Visit (HOSPITAL_BASED_OUTPATIENT_CLINIC_OR_DEPARTMENT_OTHER): Payer: Medicare Other | Admitting: Pulmonary Disease

## 2023-07-01 ENCOUNTER — Encounter (HOSPITAL_BASED_OUTPATIENT_CLINIC_OR_DEPARTMENT_OTHER): Payer: Self-pay | Admitting: Pulmonary Disease

## 2023-07-01 VITALS — BP 122/84 | HR 52 | Ht 70.0 in | Wt 194.0 lb

## 2023-07-01 DIAGNOSIS — J9611 Chronic respiratory failure with hypoxia: Secondary | ICD-10-CM

## 2023-07-01 DIAGNOSIS — J449 Chronic obstructive pulmonary disease, unspecified: Secondary | ICD-10-CM | POA: Diagnosis not present

## 2023-07-01 NOTE — Patient Instructions (Addendum)
  End-stage COPD CONTINUE budesonide (Pulmicort) nebulizer once a day CONITNUE Ipratropium-Albuterol (Duonebs) q6h AS NEEDED for shortness of breath or wheezing CONTINUE Spiriva 2.5 mcg TWO puffs ONCE a day.  CONTINUE Symbicort 160-4.5 mcg TWO puffs TWICE a day.  CONTINUE Proair AS NEEDED for shortness of breath and wheezing Declined pulmonary rehab. Previously completed in 2021. Encourage continuing aerobic exercises and upper body exercises  AYR - nasal saline gel/mist. Use with oxygen   Chronic hypoxemic respiratory failure secondary to COPD Recommend continuous 3L with activity and sleep Continue pulsed O2 for goal >88%

## 2023-07-01 NOTE — Progress Notes (Signed)
 Subjective:   PATIENT ID: Mike Mclaughlin GENDER: male DOB: 05/16/1934, MRN: [id number]   HPI  Chief Complaint  Patient presents with   Follow-up    COPD   Reason for Visit: Follow-up   [name] is a 89 year old male with chronic hypoxemic respiratory failure secondary to COPD, bradycardia, probable pulmonary hypertension and chronic diastolic heart failure who presents for follow-up.  Synopsis: 2021 - Established care with [facility]. Started on Spiriva in January. Compliant on both Symbicort and Spiriva. On home oxygen nightly. Completed Pulmonary Rehab in [place] 2022 - Continued maintenance rehab program. Started requiring O2 with activity. Had COVID in July 2023 - Outpatient COPD exacerbation in Oct. SOB with exertion. On Symbicort and Spiriva and Duonebs TID. Compliant with oxygen with exertion  04/07/22 Wife is present and provides additional history. Since our last visit he has reported compliant oxygen. Has been short of breath. Some cough. No wheezing. On Symbicort and Spiriva. Using Duonebs four times a day which is drying out his mouth. Has not started Pulmicort daily. Exercising twice a week. He is willing to participate in Pulmonary rehab but wife worries about his oxygen compliance during transportation.  06/10/22 Wife present and provides additional history. More compliant with oxygen during the day and wearing it nightly. Inogen battery is not lasting long enough to last through church service unless they are able to sit next to an outlet. For his bronchodilator regimen he is currently on Pulmicort twice a day and Duonebs 3-4x a day. Using Symbicort and Spiriva as well. Will use albuterol on average twice a day for shortness of breath. Denies cough and wheezing. He is increasing his activity at home and is enrolled in pulmonary rehab but has had to miss class due to various reasons. Prefers to exercise on his own.  10/09/22 Wife present to provide  additional history. He reports improved energy after receiving iron infusions. He was previously nebulizer four times a day, now only albuterol 2-3 times for shortness of breath. Compliant with Symbicort and Spiriva. Able to active and exercise twice a week. No exacerbations since our last visit.   04/20/23 Since our last visit he is compliant with Symbicort and Spiriva and oxygen. Compliant with oxygen at 3-4L with pulse ox 92-98%. Wearing oxygen during the day. Uses budesonide nightly and Duonebs 3-4 daily.   07/01/23 Since our last visit he reports overall doing well on budesonide daily, Symbicort and Spiriva and oxygen. Albuterol nebulizer TID. Rarely uses handheld albuterol. Does have stable shortness of breath with walking and bending over. Stopped going to the gym in December. No exacerbations since our last visit.  Social History: 1/2ppd x 20 years. Quit 2013.  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  Past Medical History:  Diagnosis Date   Arthritis    Cardiac dysrhythmia, unspecified    Chronic pain of right knee 09/04/2017   Chronic respiratory failure with hypoxia 11/14/2015          COPD GOLD III 11/29/2013       DOE (dyspnea on exertion) 10/06/2018   Dyslipidemia    Esophageal reflux    H/O sinus bradycardia    Hypertension    Insomnia, unspecified    Malaise and fatigue 10/06/2018   Medication management 10/13/2019   Mixed hyperlipidemia    Physical deconditioning 07/12/2019   Rheumatism    Sinus bradycardia 09/04/2017   Skin cancer, basal cell    Solitary pulmonary nodule 11/29/2013  L lingula 5mm 10/2013.  Rescan 10/2014>>>no change in nodule,  Final CT 10/19/15 Rosalita Levan) > no change so no dedicated f/u rec    Status post right hip replacement 11/18/2017      Outpatient Medications Prior to Visit  Medication Sig Dispense Refill   albuterol (VENTOLIN HFA) 108 (90 Base) MCG/ACT inhaler INHALE 1 PUFF INTO THE LUNGS EVERY 6 HOURS  AS NEEDED FOR WHEEZING OR SHORTNESS OF BREATH. 17 each 5   aspirin 81 MG tablet Take 81 mg by mouth daily.     atorvastatin (LIPITOR) 10 MG tablet Take 1 tablet (10 mg total) by mouth daily. 90 tablet 3   budesonide (PULMICORT) 0.5 MG/2ML nebulizer solution Take 2 mLs (0.5 mg total) by nebulization daily. 60 mL 11   cholecalciferol 25 MCG (1000 UT) tablet Take 1,000 Units by mouth daily.     Cyanocobalamin (B-12 COMPLIANCE INJECTION) 1000 MCG/ML KIT Inject as directed.     ipratropium-albuterol (DUONEB) 0.5-2.5 (3) MG/3ML SOLN Take 3 mLs by nebulization every 6 (six) hours as needed. 120 mL 11   Multiple Vitamin (MULTIVITAMIN) tablet Take 1 tablet by mouth daily.     omeprazole (PRILOSEC) 20 MG capsule Take 20 mg by mouth daily.     OXYGEN Place 3 L into the nose daily. 2.5 lpm with sleep     polyethylene glycol (MIRALAX / GLYCOLAX) 17 g packet Take 17 g by mouth daily.     Spacer/Aero-Holding Chambers (OPTICHAMBER DIAMOND-LG MASK) DEVI Use with inhaler 1 each 0   SYMBICORT 160-4.5 MCG/ACT inhaler INHALE 2 PUFFS INTO THE LUNGS IN THE MORNING AND AT BEDTIME. 10.2 each 5   Tamsulosin HCl (FLOMAX) 0.4 MG CAPS Take 0.4 mg by mouth daily.      Tiotropium Bromide Monohydrate (SPIRIVA RESPIMAT) 2.5 MCG/ACT AERS Inhale 2 puffs into the lungs daily. 4 g 11   No facility-administered medications prior to visit.    Review of Systems  Constitutional:  Negative for chills, diaphoresis, fever, malaise/fatigue and weight loss.  HENT:  Negative for congestion.   Respiratory:  Positive for shortness of breath. Negative for cough, hemoptysis, sputum production and wheezing.   Cardiovascular:  Negative for chest pain, palpitations and leg swelling.     Objective:   Vitals:   07/01/23 1305  BP: 122/84  Pulse: (!) 52  SpO2: 92%  Weight: 194 lb (88 kg)  Height: 5\' 10"  (1.778 m)   SpO2: 92 %  Physical Exam: General: Elderly-appearing, no acute distress HENT: [place], AT Eyes: EOMI, no scleral  icterus Respiratory: Diminished but clear to auscultation bilaterally.  No crackles, wheezing or rales Cardiovascular: RRR, -M/R/G, no JVD Extremities:-Edema,-tenderness Neuro: AAO x4, CNII-XII grossly intact Psych: Normal mood, normal affect   Data Reviewed:  Imaging: CT Chest 10/23/15 (report only) - Centrilobular emphysema. 5mm lingular nodule improved compared to prior imaging CT Chest 08/01/19 - Diffuse moderate centrilobular emphysema. Left lingular atelectasis  PFT: 11/14/15 FVC 2.0 (48%) FEV1 1.0 (33%) Ratio 49  Interpretation: Very severe obstructive defect present  01/17/21 FVC 2.71 (68%) FEV1 1.34 (48%) Ratio 48  TLC 103% DLCO 47% Interpretation: Severe obstructive defect with air trapping and moderately reduced DLCO consistent with emphysema. Significant bronchodilator response in FEV1 and FVC  04/08/21 FVC 2.65 (68%) FEV1 1.26 (46%) Ratio 46  TLC 107% RV 175% DLCO 42% Interpretation: Severe obstructive defect with air trapping and moderately reduced DLCO consistent with emphysema. Significant bronchodilator response with FEV1  Labs: CBC    Component Value Date/Time   WBC  6.0 05/29/2023 1326   WBC 5.6 04/13/2019 1502   RBC 3.88 (L) 05/29/2023 1326   HGB 11.4 (L) 05/29/2023 1326   HCT 37.1 (L) 05/29/2023 1326   PLT 180 05/29/2023 1326   MCV 95.6 05/29/2023 1326   MCH 29.4 05/29/2023 1326   MCHC 30.7 05/29/2023 1326   RDW 12.5 05/29/2023 1326   LYMPHSABS 0.8 05/29/2023 1326   MONOABS 0.6 05/29/2023 1326   EOSABS 0.4 05/29/2023 1326   BASOSABS 0.1 05/29/2023 1326   Absolute eos 04/13/19 - 200  Ambulatory O2 Mon Apr 20, 2023  1:53 PM   SATURATION QUALIFICATIONS: (This note is used to comply with regulatory documentation for home oxygen)   Patient Saturations on Room Air at Rest = 99%   Patient Saturations on [employer] while Ambulating = 83%   Patient Saturations on 3 Liters oxygen while Ambulating = 95%      Assessment & Plan:   Discussion: 89 year old  male with chronic hypoxemic respiratory failure secondary to COPD, probable pulmonary HTN (WHO III) and chronic diastolic heart failure who presents for follow-up. Stable shortness of breath on current regimen of triple therapy and nebulizer therapy for end stage COPD. Discussed clinical course and management of COPD including bronchodilator regimen, preventive care including vaccinations and action plan for exacerbation.  End-stage COPD CONTINUE budesonide (Pulmicort) nebulizer once a day CONITNUE Ipratropium-Albuterol (Duonebs) q6h AS NEEDED for shortness of breath or wheezing CONTINUE Spiriva 2.5 mcg TWO puffs ONCE a day.  CONTINUE Symbicort 160-4.5 mcg TWO puffs TWICE a day.  CONTINUE Proair AS NEEDED for shortness of breath and wheezing Declined pulmonary rehab. Previously completed in 2021. Encourage continuing aerobic exercises and upper body exercises    Chronic hypoxemic respiratory failure secondary to COPD Recommend continuous 3L with activity and sleep Continue pulsed O2 for goal >88%  GOC Previously discussed goals of care including wish for CPR and ventilator. Please continue discussions at home.  Currently full code  Hx of Nightmares/Activity --Call if you wish for sleep referral  Health Maintenance Immunization History  Administered Date(s) Administered   DTaP 06/30/2011   Fluad Quad(high Dose 65+) 01/03/2021   Influenza Nasal 12/21/2018   Influenza Split 01/01/2015   Influenza, High Dose Seasonal PF 01/09/2016, 01/12/2018, 01/12/2018, 12/26/2019   Influenza-Unspecified 12/22/2012, 12/22/2013, 12/22/2017   PFIZER(Purple Top)SARS-COV-2 Vaccination 04/05/2019, 05/06/2019, 02/01/2020   Pneumococcal Conjugate-13 09/28/2013   Pneumococcal Polysaccharide-23 05/30/2010   CT Lung Screen - not qualified  No orders of the defined types were placed in this encounter.  No orders of the defined types were placed in this encounter.  Return in about 6 months (around  12/31/2023).   I have spent a total time of 35-minutes on the day of the appointment including chart review, data review, collecting history, coordinating care and discussing medical diagnosis and plan with the patient/family. Past medical history, allergies, medications were reviewed. Pertinent imaging, labs and tests included in this note have been reviewed and interpreted independently by me.  Markeda Narvaez Mechele Collin, MD  Pulmonary Critical Care 07/01/2023 1:26 PM

## 2023-07-15 DIAGNOSIS — M1990 Unspecified osteoarthritis, unspecified site: Secondary | ICD-10-CM | POA: Diagnosis not present

## 2023-07-15 DIAGNOSIS — R030 Elevated blood-pressure reading, without diagnosis of hypertension: Secondary | ICD-10-CM | POA: Diagnosis present

## 2023-07-15 DIAGNOSIS — Z1152 Encounter for screening for COVID-19: Secondary | ICD-10-CM | POA: Diagnosis not present

## 2023-07-15 DIAGNOSIS — Z7951 Long term (current) use of inhaled steroids: Secondary | ICD-10-CM | POA: Diagnosis not present

## 2023-07-15 DIAGNOSIS — J441 Chronic obstructive pulmonary disease with (acute) exacerbation: Secondary | ICD-10-CM | POA: Diagnosis not present

## 2023-07-15 DIAGNOSIS — R001 Bradycardia, unspecified: Secondary | ICD-10-CM | POA: Diagnosis not present

## 2023-07-15 DIAGNOSIS — Z87891 Personal history of nicotine dependence: Secondary | ICD-10-CM | POA: Diagnosis not present

## 2023-07-15 DIAGNOSIS — K219 Gastro-esophageal reflux disease without esophagitis: Secondary | ICD-10-CM | POA: Diagnosis not present

## 2023-07-15 DIAGNOSIS — R0602 Shortness of breath: Secondary | ICD-10-CM | POA: Diagnosis not present

## 2023-07-15 DIAGNOSIS — N183 Chronic kidney disease, stage 3 unspecified: Secondary | ICD-10-CM | POA: Diagnosis present

## 2023-07-15 DIAGNOSIS — D509 Iron deficiency anemia, unspecified: Secondary | ICD-10-CM | POA: Diagnosis present

## 2023-07-15 DIAGNOSIS — I160 Hypertensive urgency: Secondary | ICD-10-CM | POA: Diagnosis not present

## 2023-07-15 DIAGNOSIS — E785 Hyperlipidemia, unspecified: Secondary | ICD-10-CM | POA: Diagnosis present

## 2023-07-15 DIAGNOSIS — R0902 Hypoxemia: Secondary | ICD-10-CM | POA: Diagnosis not present

## 2023-07-15 DIAGNOSIS — Z79899 Other long term (current) drug therapy: Secondary | ICD-10-CM | POA: Diagnosis not present

## 2023-07-15 DIAGNOSIS — Z7982 Long term (current) use of aspirin: Secondary | ICD-10-CM | POA: Diagnosis not present

## 2023-07-15 DIAGNOSIS — Z9981 Dependence on supplemental oxygen: Secondary | ICD-10-CM | POA: Diagnosis not present

## 2023-07-15 DIAGNOSIS — N400 Enlarged prostate without lower urinary tract symptoms: Secondary | ICD-10-CM | POA: Diagnosis present

## 2023-07-15 DIAGNOSIS — R0609 Other forms of dyspnea: Secondary | ICD-10-CM | POA: Diagnosis not present

## 2023-07-15 DIAGNOSIS — J9621 Acute and chronic respiratory failure with hypoxia: Secondary | ICD-10-CM | POA: Diagnosis present

## 2023-07-15 DIAGNOSIS — R059 Cough, unspecified: Secondary | ICD-10-CM | POA: Diagnosis not present

## 2023-07-15 DIAGNOSIS — R9431 Abnormal electrocardiogram [ECG] [EKG]: Secondary | ICD-10-CM | POA: Diagnosis present

## 2023-07-23 DIAGNOSIS — K219 Gastro-esophageal reflux disease without esophagitis: Secondary | ICD-10-CM | POA: Diagnosis not present

## 2023-07-23 DIAGNOSIS — J9621 Acute and chronic respiratory failure with hypoxia: Secondary | ICD-10-CM | POA: Diagnosis not present

## 2023-07-23 DIAGNOSIS — J441 Chronic obstructive pulmonary disease with (acute) exacerbation: Secondary | ICD-10-CM | POA: Diagnosis not present

## 2023-07-23 DIAGNOSIS — I10 Essential (primary) hypertension: Secondary | ICD-10-CM | POA: Diagnosis not present

## 2023-08-14 ENCOUNTER — Other Ambulatory Visit (HOSPITAL_BASED_OUTPATIENT_CLINIC_OR_DEPARTMENT_OTHER): Payer: Self-pay | Admitting: Pulmonary Disease

## 2023-08-14 ENCOUNTER — Other Ambulatory Visit (HOSPITAL_BASED_OUTPATIENT_CLINIC_OR_DEPARTMENT_OTHER): Payer: Self-pay

## 2023-08-14 DIAGNOSIS — J449 Chronic obstructive pulmonary disease, unspecified: Secondary | ICD-10-CM

## 2023-08-14 MED ORDER — ALBUTEROL SULFATE HFA 108 (90 BASE) MCG/ACT IN AERS
1.0000 | INHALATION_SPRAY | Freq: Four times a day (QID) | RESPIRATORY_TRACT | 5 refills | Status: DC | PRN
Start: 2023-08-14 — End: 2024-01-05

## 2023-08-14 MED ORDER — ALBUTEROL SULFATE HFA 108 (90 BASE) MCG/ACT IN AERS
1.0000 | INHALATION_SPRAY | Freq: Four times a day (QID) | RESPIRATORY_TRACT | 5 refills | Status: DC | PRN
Start: 2023-08-14 — End: 2023-08-14

## 2023-08-17 ENCOUNTER — Other Ambulatory Visit (HOSPITAL_BASED_OUTPATIENT_CLINIC_OR_DEPARTMENT_OTHER): Payer: Self-pay

## 2023-08-17 MED ORDER — BUDESONIDE 0.5 MG/2ML IN SUSP: 0.5000 mg | Freq: Every day | RESPIRATORY_TRACT | 2 refills | Status: DC

## 2023-08-17 MED ORDER — IPRATROPIUM-ALBUTEROL 0.5-2.5 (3) MG/3ML IN SOLN: 3.0000 mL | Freq: Four times a day (QID) | RESPIRATORY_TRACT | 2 refills | Status: DC | PRN

## 2023-08-24 DIAGNOSIS — J449 Chronic obstructive pulmonary disease, unspecified: Secondary | ICD-10-CM | POA: Diagnosis not present

## 2023-08-24 DIAGNOSIS — D519 Vitamin B12 deficiency anemia, unspecified: Secondary | ICD-10-CM | POA: Diagnosis not present

## 2023-08-24 DIAGNOSIS — J9611 Chronic respiratory failure with hypoxia: Secondary | ICD-10-CM | POA: Diagnosis not present

## 2023-08-24 DIAGNOSIS — I10 Essential (primary) hypertension: Secondary | ICD-10-CM | POA: Diagnosis not present

## 2023-09-24 ENCOUNTER — Telehealth (HOSPITAL_BASED_OUTPATIENT_CLINIC_OR_DEPARTMENT_OTHER): Payer: Self-pay

## 2023-09-24 NOTE — Telephone Encounter (Signed)
CMN received and faxed.

## 2023-09-27 NOTE — Progress Notes (Signed)
 [facility]  [address] [place],  [place]  [address] [phone]  Clinic Day:  09/28/2023  Referring physician: Benjamine Lauraine DASEN, NP   HISTORY OF PRESENT ILLNESS:  The patient is a 89 y.o. male with iron deficiency anemia.   IV iron given in April 2024 led to the improvement in both his iron and hemoglobin levels.  He comes in today to reassess these parameters.  Since his last visit, the patient has been doing fairly well.  He denies having increased fatigue or any overt forms of blood loss which concern him for recurrent iron deficiency anemia.    PHYSICAL EXAM:  Blood pressure (!) 131/57, pulse (!) 48, temperature 98.8 F (37.1 C), temperature source Oral, resp. rate 16, height 5' 10 (1.778 m), weight 186 lb 11.2 oz (84.7 kg), SpO2 100%. Wt Readings from Last 3 Encounters:  09/28/23 186 lb 11.2 oz (84.7 kg)  07/01/23 194 lb (88 kg)  05/29/23 195 lb (88.5 kg)   Body mass index is 26.79 kg/m. Performance status (ECOG): 2 - Symptomatic, <50% confined to bed Physical Exam Constitutional:      Appearance: Normal appearance. He is not ill-appearing.  HENT:     Mouth/Throat:     Mouth: Mucous membranes are moist.     Pharynx: Oropharynx is clear. No oropharyngeal exudate or posterior oropharyngeal erythema.  Cardiovascular:     Rate and Rhythm: Normal rate and regular rhythm.     Heart sounds: No murmur heard.    No friction rub. No gallop.  Pulmonary:     Effort: Pulmonary effort is normal. No respiratory distress.     Breath sounds: Normal breath sounds. No wheezing, rhonchi or rales.  Abdominal:     General: Bowel sounds are normal. There is no distension.     Palpations: Abdomen is soft. There is no mass.     Tenderness: There is no abdominal tenderness.  Musculoskeletal:        General: No swelling.     Right lower leg: No edema.     Left lower leg: No edema.  Lymphadenopathy:     Cervical: No cervical adenopathy.     Upper Body:      Right upper body: No supraclavicular or axillary adenopathy.     Left upper body: No supraclavicular or axillary adenopathy.     Lower Body: No right inguinal adenopathy. No left inguinal adenopathy.  Skin:    General: Skin is warm.     Coloration: Skin is not jaundiced.     Findings: No lesion or rash.  Neurological:     General: No focal deficit present.     Mental Status: He is alert and oriented to person, place, and time. Mental status is at baseline.  Psychiatric:        Mood and Affect: Mood normal.        Behavior: Behavior normal.        Thought Content: Thought content normal.    LABS:      Latest Ref Rng & Units 09/28/2023    1:20 PM 05/29/2023    1:26 PM 01/30/2023    1:30 PM  CBC  WBC 4.0 - 10.5 K/uL 5.3  6.0  7.2   Hemoglobin 13.0 - 17.0 g/dL 88.2  88.5  88.3   Hematocrit 39.0 - 52.0 % 38.0  37.1  37.0   Platelets 150 - 400 K/uL 172  180  183       Latest  Ref Rng & Units 09/28/2023    1:20 PM 05/29/2023    1:26 PM 06/30/2022    2:32 PM  CMP  Glucose 70 - 99 mg/dL 898  897  82   BUN 8 - 23 mg/dL 27  29  29    Creatinine 0.61 - 1.24 mg/dL 8.34  8.57  8.72   Sodium 135 - 145 mmol/L 140  140  141   Potassium 3.5 - 5.1 mmol/L 5.0  4.5  4.7   Chloride 98 - 111 mmol/L 103  104  107   CO2 22 - 32 mmol/L 26  27  28    Calcium  8.9 - 10.3 mg/dL 9.6  9.5  9.0   Total Protein 6.5 - 8.1 g/dL 6.4  6.5  6.9   Total Bilirubin 0.0 - 1.2 mg/dL 0.4  0.3  0.6   Alkaline Phos 38 - 126 U/L 104  102  78   AST 15 - 41 U/L 16  19  20    ALT 0 - 44 U/L 9  8  16      Latest Reference Range & Units 09/28/23 13:20  Iron 45 - 182 ug/dL 894  UIBC ug/dL 713  TIBC 749 - 549 ug/dL 608  Saturation Ratios 17.9 - 39.5 % 27  Ferritin 24 - 336 ng/mL 20 (L)  (L): Data is abnormally low  ASSESSMENT & PLAN:  An 89 y.o. male with iron deficiency anemia.  When evaluating his labs today, his hemoglobin is slightly lower than what it was previously.  His iron parameters today do show a low ferritin  level.  Based upon this, I will give him IV iron over these next few weeks to replenish his iron stores and improve his hemoglobin.  Of note, labs continue to show mild renal insufficiency, which is also likely contributing to his mild anemia. I will see him back in 4 months for repeat clinical assessment.  The patient understands all the plans discussed today and is in agreement with them.  Shameika Speelman DELENA Kerns, MD

## 2023-09-28 ENCOUNTER — Inpatient Hospital Stay: Attending: Oncology | Admitting: Oncology

## 2023-09-28 ENCOUNTER — Inpatient Hospital Stay

## 2023-09-28 ENCOUNTER — Other Ambulatory Visit: Payer: Self-pay | Admitting: Oncology

## 2023-09-28 ENCOUNTER — Other Ambulatory Visit: Payer: Self-pay

## 2023-09-28 ENCOUNTER — Telehealth: Payer: Self-pay | Admitting: Oncology

## 2023-09-28 VITALS — BP 131/57 | HR 48 | Temp 98.8°F | Resp 16 | Ht 70.0 in | Wt 186.7 lb

## 2023-09-28 DIAGNOSIS — N289 Disorder of kidney and ureter, unspecified: Secondary | ICD-10-CM | POA: Insufficient documentation

## 2023-09-28 DIAGNOSIS — D500 Iron deficiency anemia secondary to blood loss (chronic): Secondary | ICD-10-CM

## 2023-09-28 DIAGNOSIS — Z79899 Other long term (current) drug therapy: Secondary | ICD-10-CM | POA: Insufficient documentation

## 2023-09-28 DIAGNOSIS — D509 Iron deficiency anemia, unspecified: Secondary | ICD-10-CM | POA: Diagnosis not present

## 2023-09-28 LAB — IRON AND TIBC
Iron: 105 ug/dL (ref 45–182)
Saturation Ratios: 27 % (ref 17.9–39.5)
TIBC: 391 ug/dL (ref 250–450)
UIBC: 286 ug/dL

## 2023-09-28 LAB — CBC WITH DIFFERENTIAL (CANCER CENTER ONLY)
Abs Immature Granulocytes: 0.03 K/uL (ref 0.00–0.07)
Basophils Absolute: 0.1 K/uL (ref 0.0–0.1)
Basophils Relative: 1 %
Eosinophils Absolute: 0.2 K/uL (ref 0.0–0.5)
Eosinophils Relative: 5 %
HCT: 38 % — ABNORMAL LOW (ref 39.0–52.0)
Hemoglobin: 11.7 g/dL — ABNORMAL LOW (ref 13.0–17.0)
Immature Granulocytes: 1 %
Lymphocytes Relative: 18 %
Lymphs Abs: 1 K/uL (ref 0.7–4.0)
MCH: 29.3 pg (ref 26.0–34.0)
MCHC: 30.8 g/dL (ref 30.0–36.0)
MCV: 95.2 fL (ref 80.0–100.0)
Monocytes Absolute: 0.5 K/uL (ref 0.1–1.0)
Monocytes Relative: 9 %
Neutro Abs: 3.5 K/uL (ref 1.7–7.7)
Neutrophils Relative %: 66 %
Platelet Count: 172 K/uL (ref 150–400)
RBC: 3.99 MIL/uL — ABNORMAL LOW (ref 4.22–5.81)
RDW: 12.3 % (ref 11.5–15.5)
WBC Count: 5.3 K/uL (ref 4.0–10.5)
nRBC: 0 % (ref 0.0–0.2)

## 2023-09-28 LAB — CMP (CANCER CENTER ONLY)
ALT: 9 U/L (ref 0–44)
AST: 16 U/L (ref 15–41)
Albumin: 4.2 g/dL (ref 3.5–5.0)
Alkaline Phosphatase: 104 U/L (ref 38–126)
Anion gap: 11 (ref 5–15)
BUN: 27 mg/dL — ABNORMAL HIGH (ref 8–23)
CO2: 26 mmol/L (ref 22–32)
Calcium: 9.6 mg/dL (ref 8.9–10.3)
Chloride: 103 mmol/L (ref 98–111)
Creatinine: 1.65 mg/dL — ABNORMAL HIGH (ref 0.61–1.24)
GFR, Estimated: 40 mL/min — ABNORMAL LOW (ref >60)
Glucose, Bld: 101 mg/dL — ABNORMAL HIGH (ref 70–99)
Potassium: 5 mmol/L (ref 3.5–5.1)
Sodium: 140 mmol/L (ref 135–145)
Total Bilirubin: 0.4 mg/dL (ref 0.0–1.2)
Total Protein: 6.4 g/dL — ABNORMAL LOW (ref 6.5–8.1)

## 2023-09-28 LAB — FERRITIN: Ferritin: 20 ng/mL — ABNORMAL LOW (ref 24–336)

## 2023-09-28 NOTE — Telephone Encounter (Signed)
 Patient has been scheduled for follow-up visit per 09/28/23 LOS.  Pt given an appt calendar with date and time.

## 2023-10-01 ENCOUNTER — Telehealth: Payer: Self-pay

## 2023-10-01 NOTE — Telephone Encounter (Signed)
 Dr Ezzard reviewed labs, states nothing concerning. He will see pt as scheduled for f/u.   Latest Reference Range & Units 09/28/23 13:20  Iron 45 - 182 ug/dL 894  UIBC ug/dL 713  TIBC 749 - 549 ug/dL 608  Saturation Ratios 17.9 - 39.5 % 27  Ferritin 24 - 336 ng/mL 20 (L)    (L): Data is abnormally low

## 2023-10-07 DIAGNOSIS — D519 Vitamin B12 deficiency anemia, unspecified: Secondary | ICD-10-CM | POA: Diagnosis not present

## 2023-10-09 ENCOUNTER — Telehealth (HOSPITAL_BASED_OUTPATIENT_CLINIC_OR_DEPARTMENT_OTHER): Payer: Self-pay

## 2023-10-09 NOTE — Telephone Encounter (Signed)
 CMN received signed by provider and faxed  confirmation received

## 2023-10-16 ENCOUNTER — Encounter: Payer: Self-pay | Admitting: Oncology

## 2023-10-16 ENCOUNTER — Telehealth: Payer: Self-pay | Admitting: Oncology

## 2023-10-16 NOTE — Telephone Encounter (Signed)
 error

## 2023-11-02 ENCOUNTER — Telehealth: Payer: Self-pay

## 2023-11-02 NOTE — Telephone Encounter (Addendum)
 Dr Ezzard: pt needs to be set up for iv iron. let him know this and schedule within the next few weeks. Thx.   09/28/23 13:20 Ferritin: 20 (L)      If pt doesn't want to take IV iron, he can try oral iron 1 tab daily per Dr Ezzard.

## 2023-11-03 ENCOUNTER — Encounter: Payer: Self-pay | Admitting: Oncology

## 2023-11-03 ENCOUNTER — Telehealth: Payer: Self-pay | Admitting: Oncology

## 2023-11-03 NOTE — Telephone Encounter (Signed)
 11/03/23 Spoke with patients wife and scheduled Iron.

## 2023-11-10 ENCOUNTER — Inpatient Hospital Stay: Attending: Oncology

## 2023-11-10 VITALS — BP 149/54 | HR 50 | Temp 98.2°F | Resp 20

## 2023-11-10 DIAGNOSIS — D509 Iron deficiency anemia, unspecified: Secondary | ICD-10-CM | POA: Diagnosis not present

## 2023-11-10 DIAGNOSIS — Z79899 Other long term (current) drug therapy: Secondary | ICD-10-CM | POA: Insufficient documentation

## 2023-11-10 DIAGNOSIS — D500 Iron deficiency anemia secondary to blood loss (chronic): Secondary | ICD-10-CM

## 2023-11-10 MED ORDER — SODIUM CHLORIDE 0.9 % IV SOLN
510.0000 mg | Freq: Once | INTRAVENOUS | Status: AC
  Administered 2023-11-10: 510 mg via INTRAVENOUS
  Filled 2023-11-10: qty 510

## 2023-11-10 MED ORDER — SODIUM CHLORIDE 0.9 % IV SOLN: INTRAVENOUS | Status: DC

## 2023-11-10 NOTE — Patient Instructions (Signed)

## 2023-11-16 ENCOUNTER — Inpatient Hospital Stay

## 2023-11-16 VITALS — BP 179/58 | HR 44 | Temp 97.7°F | Resp 18

## 2023-11-16 DIAGNOSIS — D509 Iron deficiency anemia, unspecified: Secondary | ICD-10-CM | POA: Diagnosis not present

## 2023-11-16 DIAGNOSIS — D500 Iron deficiency anemia secondary to blood loss (chronic): Secondary | ICD-10-CM

## 2023-11-16 DIAGNOSIS — Z79899 Other long term (current) drug therapy: Secondary | ICD-10-CM | POA: Diagnosis not present

## 2023-11-16 MED ORDER — SODIUM CHLORIDE 0.9 % IV SOLN: INTRAVENOUS | Status: DC

## 2023-11-16 MED ORDER — SODIUM CHLORIDE 0.9 % IV SOLN
510.0000 mg | Freq: Once | INTRAVENOUS | Status: AC
  Administered 2023-11-16: 510 mg via INTRAVENOUS
  Filled 2023-11-16: qty 510

## 2023-11-16 NOTE — Patient Instructions (Signed)

## 2023-11-24 DIAGNOSIS — J9611 Chronic respiratory failure with hypoxia: Secondary | ICD-10-CM | POA: Diagnosis not present

## 2023-11-24 DIAGNOSIS — D519 Vitamin B12 deficiency anemia, unspecified: Secondary | ICD-10-CM | POA: Diagnosis not present

## 2023-11-24 DIAGNOSIS — Z0000 Encounter for general adult medical examination without abnormal findings: Secondary | ICD-10-CM | POA: Diagnosis not present

## 2023-11-24 DIAGNOSIS — J449 Chronic obstructive pulmonary disease, unspecified: Secondary | ICD-10-CM | POA: Diagnosis not present

## 2023-11-24 DIAGNOSIS — I272 Pulmonary hypertension, unspecified: Secondary | ICD-10-CM | POA: Diagnosis not present

## 2023-11-24 DIAGNOSIS — N183 Chronic kidney disease, stage 3 unspecified: Secondary | ICD-10-CM | POA: Diagnosis not present

## 2023-11-24 DIAGNOSIS — D509 Iron deficiency anemia, unspecified: Secondary | ICD-10-CM | POA: Diagnosis not present

## 2023-12-24 DIAGNOSIS — Z23 Encounter for immunization: Secondary | ICD-10-CM | POA: Diagnosis not present

## 2023-12-28 ENCOUNTER — Other Ambulatory Visit: Payer: Self-pay

## 2023-12-28 ENCOUNTER — Telehealth: Payer: Self-pay

## 2023-12-28 DIAGNOSIS — R5381 Other malaise: Secondary | ICD-10-CM

## 2023-12-28 DIAGNOSIS — D500 Iron deficiency anemia secondary to blood loss (chronic): Secondary | ICD-10-CM

## 2023-12-28 NOTE — Telephone Encounter (Signed)
 Pt's wife called to report that Mike Mclaughlin is pretty weak. He isn't doing well. He is having some dizziness. He almost fell the other day, but they caught him. His next appt with you is Jan 2026. She said he couldn't wait that long.

## 2023-12-29 ENCOUNTER — Inpatient Hospital Stay: Attending: Oncology

## 2023-12-29 DIAGNOSIS — D509 Iron deficiency anemia, unspecified: Secondary | ICD-10-CM | POA: Diagnosis not present

## 2023-12-29 DIAGNOSIS — R5383 Other fatigue: Secondary | ICD-10-CM | POA: Insufficient documentation

## 2023-12-29 DIAGNOSIS — R5381 Other malaise: Secondary | ICD-10-CM | POA: Insufficient documentation

## 2023-12-29 DIAGNOSIS — D500 Iron deficiency anemia secondary to blood loss (chronic): Secondary | ICD-10-CM

## 2023-12-29 LAB — CBC WITH DIFFERENTIAL (CANCER CENTER ONLY)
Abs Immature Granulocytes: 0.04 K/uL (ref 0.00–0.07)
Basophils Absolute: 0.1 K/uL (ref 0.0–0.1)
Basophils Relative: 1 %
Eosinophils Absolute: 0.5 K/uL (ref 0.0–0.5)
Eosinophils Relative: 8 %
HCT: 36.7 % — ABNORMAL LOW (ref 39.0–52.0)
Hemoglobin: 11.4 g/dL — ABNORMAL LOW (ref 13.0–17.0)
Immature Granulocytes: 1 %
Lymphocytes Relative: 15 %
Lymphs Abs: 0.9 K/uL (ref 0.7–4.0)
MCH: 30.2 pg (ref 26.0–34.0)
MCHC: 31.1 g/dL (ref 30.0–36.0)
MCV: 97.1 fL (ref 80.0–100.0)
Monocytes Absolute: 0.6 K/uL (ref 0.1–1.0)
Monocytes Relative: 10 %
Neutro Abs: 4.1 K/uL (ref 1.7–7.7)
Neutrophils Relative %: 65 %
Platelet Count: 158 K/uL (ref 150–400)
RBC: 3.78 MIL/uL — ABNORMAL LOW (ref 4.22–5.81)
RDW: 13 % (ref 11.5–15.5)
WBC Count: 6.2 K/uL (ref 4.0–10.5)
nRBC: 0 % (ref 0.0–0.2)

## 2023-12-29 LAB — IRON AND TIBC
Iron: 119 ug/dL (ref 45–182)
Saturation Ratios: 38 % (ref 17.9–39.5)
TIBC: 315 ug/dL (ref 250–450)
UIBC: 196 ug/dL

## 2023-12-29 LAB — FERRITIN: Ferritin: 365 ng/mL — ABNORMAL HIGH (ref 24–336)

## 2023-12-30 NOTE — Telephone Encounter (Signed)
 Latest Reference Range & Units 12/29/23 13:54 12/29/23 13:55  Iron 45 - 182 ug/dL 880   UIBC ug/dL 803   TIBC 749 - 549 ug/dL 684   Saturation Ratios 17.9 - 39.5 % 38   Ferritin 24 - 336 ng/mL 365 (H)   WBC 4.0 - 10.5 K/uL  6.2  RBC 4.22 - 5.81 MIL/uL  3.78 (L)  Hemoglobin 13.0 - 17.0 g/dL  88.5 (L)  HCT 60.9 - 47.9 %  36.7 (L)  (H): Data is abnormally high (L): Data is abnormally low

## 2024-01-04 ENCOUNTER — Other Ambulatory Visit: Payer: Self-pay

## 2024-01-04 DIAGNOSIS — J9611 Chronic respiratory failure with hypoxia: Secondary | ICD-10-CM

## 2024-01-04 DIAGNOSIS — J449 Chronic obstructive pulmonary disease, unspecified: Secondary | ICD-10-CM

## 2024-01-04 MED ORDER — IPRATROPIUM-ALBUTEROL 0.5-2.5 (3) MG/3ML IN SOLN: 3.0000 mL | Freq: Four times a day (QID) | RESPIRATORY_TRACT | 2 refills | Status: DC | PRN

## 2024-01-04 NOTE — Progress Notes (Signed)
 Paper copy received through [employer] for a rx on the Duoneb nebulizer and nebulizer supplies. This was called in electronically as we do not do paper RX. Pt is scheduled to see Dr Kassie tomorrow. NFN

## 2024-01-05 ENCOUNTER — Encounter (HOSPITAL_BASED_OUTPATIENT_CLINIC_OR_DEPARTMENT_OTHER): Payer: Self-pay | Admitting: Pulmonary Disease

## 2024-01-05 ENCOUNTER — Ambulatory Visit (INDEPENDENT_AMBULATORY_CARE_PROVIDER_SITE_OTHER): Admitting: Pulmonary Disease

## 2024-01-05 VITALS — BP 132/53 | HR 54 | Ht 70.0 in | Wt 181.0 lb

## 2024-01-05 DIAGNOSIS — Z79899 Other long term (current) drug therapy: Secondary | ICD-10-CM

## 2024-01-05 DIAGNOSIS — I5032 Chronic diastolic (congestive) heart failure: Secondary | ICD-10-CM | POA: Diagnosis not present

## 2024-01-05 DIAGNOSIS — J9611 Chronic respiratory failure with hypoxia: Secondary | ICD-10-CM | POA: Diagnosis not present

## 2024-01-05 DIAGNOSIS — Z87891 Personal history of nicotine dependence: Secondary | ICD-10-CM

## 2024-01-05 DIAGNOSIS — Z7189 Other specified counseling: Secondary | ICD-10-CM

## 2024-01-05 DIAGNOSIS — J449 Chronic obstructive pulmonary disease, unspecified: Secondary | ICD-10-CM

## 2024-01-05 DIAGNOSIS — R5381 Other malaise: Secondary | ICD-10-CM

## 2024-01-05 DIAGNOSIS — J441 Chronic obstructive pulmonary disease with (acute) exacerbation: Secondary | ICD-10-CM | POA: Diagnosis not present

## 2024-01-05 MED ORDER — BUDESONIDE-FORMOTEROL FUMARATE 160-4.5 MCG/ACT IN AERO: 2.0000 | INHALATION_SPRAY | Freq: Two times a day (BID) | RESPIRATORY_TRACT | 11 refills | Status: AC

## 2024-01-05 MED ORDER — ALBUTEROL SULFATE HFA 108 (90 BASE) MCG/ACT IN AERS: 1.0000 | INHALATION_SPRAY | Freq: Four times a day (QID) | RESPIRATORY_TRACT | 5 refills | Status: AC | PRN

## 2024-01-05 MED ORDER — SPIRIVA RESPIMAT 2.5 MCG/ACT IN AERS: 2.0000 | INHALATION_SPRAY | Freq: Every day | RESPIRATORY_TRACT | 11 refills | Status: AC

## 2024-01-05 MED ORDER — BUDESONIDE 0.5 MG/2ML IN SUSP: 0.5000 mg | Freq: Every day | RESPIRATORY_TRACT | 11 refills | Status: DC

## 2024-01-05 MED ORDER — PREDNISONE 10 MG PO TABS
ORAL_TABLET | ORAL | 0 refills | Status: AC
Start: 2024-01-05 — End: 2024-01-17

## 2024-01-05 MED ORDER — IPRATROPIUM-ALBUTEROL 0.5-2.5 (3) MG/3ML IN SOLN: 3.0000 mL | Freq: Four times a day (QID) | RESPIRATORY_TRACT | 11 refills | Status: DC | PRN

## 2024-01-05 NOTE — Progress Notes (Signed)
 [id number] Home patient SATURATION QUALIFICATIONS: (This note is used to comply with regulatory documentation for home oxygen )   Patient Saturations on Room Air at Rest = 95%   Patient Saturations on Room Air while Ambulating = 84%   Patient Saturations on 3 Liters of continuous oxygen  while Ambulating =92 %   Please briefly explain why patient needs home oxygen : Pt can not maintain O2 levels above 90 % with exertion without 3L of O2. SATURATION QUALIFICATIONS: (This note is used to comply with regulatory documentation for home oxygen )

## 2024-01-05 NOTE — Progress Notes (Signed)
 Subjective:   PATIENT ID: Mike Mclaughlin GENDER: male DOB: 1934-11-11, MRN: [id number]   HPI  Chief Complaint  Patient presents with   Follow-up   Reason for Visit: Follow-up   Mike Mclaughlin is a 89 year old male with chronic hypoxemic respiratory failure secondary to COPD, bradycardia, probable pulmonary hypertension and chronic diastolic heart failure who presents for follow-up.  Synopsis: 2021 - Established care with [facility]. Started on Spiriva  in January. Compliant on both Symbicort  and Spiriva . On home oxygen  nightly. Completed Pulmonary Rehab in [place] 2022 - Continued maintenance rehab program. Started requiring O2 with activity. Had COVID in July 2023 - Outpatient COPD exacerbation in Oct. SOB with exertion. On Symbicort  and Spiriva  and Duonebs TID. Compliant with oxygen  with exertion 2024 -Compliant on Symbicort , Spiriva , oxygen , scheduled duonebs. Pulmicort  added to regimen. No exacerbations this year. Iron infusions helped with energy this year 2025 - Hospitalized for exacerbation in June. On Spiriva , Symbicort , budesonide  nebs and oxygen .  01/05/24 Since our last visit he has worsening shortness, cough and energy. Has recently been on iron infusion which initially helped his fatigue but now remains persistent.. Compliant with Symbicort , Spiriva  and budesonide  for half the week daily. Using albuterol  1-2 times a day. He was last hospitalized for COPD exacerbation in June for three nights. Has been compliant with his inhalers, nebulizers and oxygen .  Social History: 1/2ppd x 20 years. Quit 2013.  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  Past Medical History:  Diagnosis Date   Arthritis    Cardiac dysrhythmia, unspecified    Chronic pain of right knee 09/04/2017   Chronic respiratory failure with hypoxia 11/14/2015          COPD GOLD III 11/29/2013       DOE (dyspnea on exertion) 10/06/2018   Dyslipidemia     Esophageal reflux    H/O sinus bradycardia    Hypertension    Insomnia, unspecified    Malaise and fatigue 10/06/2018   Medication management 10/13/2019   Mixed hyperlipidemia    Physical deconditioning 07/12/2019   Rheumatism    Sinus bradycardia 09/04/2017   Skin cancer, basal cell    Solitary pulmonary nodule 11/29/2013   L lingula 5mm 10/2013.  Rescan 10/2014>>>no change in nodule,  Final CT 10/19/15 jennye) > no change so no dedicated f/u rec    Status post right hip replacement 11/18/2017      Outpatient Medications Prior to Visit  Medication Sig Dispense Refill   albuterol  (VENTOLIN  HFA) 108 (90 Base) MCG/ACT inhaler Inhale 1 puff into the lungs every 6 (six) hours as needed for wheezing or shortness of breath. 17 each 5   aspirin 81 MG tablet Take 81 mg by mouth daily.     atorvastatin  (LIPITOR) 10 MG tablet Take 1 tablet (10 mg total) by mouth daily. 90 tablet 3   budesonide  (PULMICORT ) 0.5 MG/2ML nebulizer solution Take 2 mLs (0.5 mg total) by nebulization daily. 60 mL 2   cholecalciferol 25 MCG (1000 UT) tablet Take 1,000 Units by mouth daily.     Cyanocobalamin (B-12 COMPLIANCE INJECTION) 1000 MCG/ML KIT Inject as directed.     ipratropium-albuterol  (DUONEB) 0.5-2.5 (3) MG/3ML SOLN Take 3 mLs by nebulization every 6 (six) hours as needed. 120 mL 2   Multiple Vitamin (MULTIVITAMIN) tablet Take 1 tablet by mouth daily.     omeprazole (PRILOSEC) 20 MG capsule Take 20 mg by mouth daily.     OXYGEN  Place 3 L into  the nose daily. 2.5 lpm with sleep     polyethylene glycol (MIRALAX / GLYCOLAX) 17 g packet Take 17 g by mouth daily.     Spacer/Aero-Holding Chambers (OPTICHAMBER DIAMOND -LG MASK) DEVI Use with inhaler 1 each 0   SYMBICORT  160-4.5 MCG/ACT inhaler INHALE 2 PUFFS INTO THE LUNGS IN THE MORNING AND AT BEDTIME. 10.2 each 5   Tamsulosin HCl (FLOMAX) 0.4 MG CAPS Take 0.4 mg by mouth daily.      Tiotropium Bromide  Monohydrate (SPIRIVA  RESPIMAT) 2.5 MCG/ACT AERS Inhale 2 puffs into  the lungs daily. 4 g 11   No facility-administered medications prior to visit.    Review of Systems  Constitutional:  Positive for malaise/fatigue. Negative for chills, diaphoresis, fever and weight loss.  HENT:  Negative for congestion.   Respiratory:  Positive for cough and shortness of breath. Negative for hemoptysis, sputum production and wheezing.   Cardiovascular:  Negative for chest pain, palpitations and leg swelling.     Objective:   Vitals:   01/05/24 1426  BP: (!) 132/53  Pulse: (!) 54  SpO2: 99%  Weight: 181 lb (82.1 kg)  Height: 5' 10 (1.778 m)   SpO2: 99 % (3 liters)  Physical Exam: General: Chronically ill-appearing, no acute distress HENT: [place], AT Eyes: EOMI, no scleral icterus Respiratory: Diminished to auscultation bilaterally.  No crackles, wheezing or rales Cardiovascular: RRR, -M/R/G, no JVD Extremities:-Edema,-tenderness Neuro: AAO x4, CNII-XII grossly intact Psych: Normal mood, normal affect  Data Reviewed:  Imaging: CT Chest 10/23/15 (report only) - Centrilobular emphysema. 5mm lingular nodule improved compared to prior imaging CT Chest 08/01/19 - Diffuse moderate centrilobular emphysema. Left lingular atelectasis  PFT: 11/14/15 FVC 2.0 (48%) FEV1 1.0 (33%) Ratio 49  Interpretation: Very severe obstructive defect present  01/17/21 FVC 2.71 (68%) FEV1 1.34 (48%) Ratio 48  TLC 103% DLCO 47% Interpretation: Severe obstructive defect with air trapping and moderately reduced DLCO consistent with emphysema. Significant bronchodilator response in FEV1 and FVC  04/08/21 FVC 2.65 (68%) FEV1 1.26 (46%) Ratio 46  TLC 107% RV 175% DLCO 42% Interpretation: Severe obstructive defect with air trapping and moderately reduced DLCO consistent with emphysema. Significant bronchodilator response with FEV1  Labs: CBC    Component Value Date/Time   WBC 6.2 12/29/2023 1355   WBC 5.6 04/13/2019 1502   RBC 3.78 (L) 12/29/2023 1355   HGB 11.4 (L) 12/29/2023 1355    HCT 36.7 (L) 12/29/2023 1355   PLT 158 12/29/2023 1355   MCV 97.1 12/29/2023 1355   MCH 30.2 12/29/2023 1355   MCHC 31.1 12/29/2023 1355   RDW 13.0 12/29/2023 1355   LYMPHSABS 0.9 12/29/2023 1355   MONOABS 0.6 12/29/2023 1355   EOSABS 0.5 12/29/2023 1355   BASOSABS 0.1 12/29/2023 1355   Absolute eos 04/13/19 - 200   Assessment & Plan:   Discussion: 89 year old male with chronic hypoxemic respiratory failure secondary to COPD, probable PH (WHO III) and chronic diastolic heart failure who presents for follow-up. Worsening dyspnea. Will treat for exacerbation but suspect progression of chronic lung disease in end stage COPD on triple therapy and nebulized budesonide . Discussed clinical course and management of COPD including bronchodilator regimen, preventive care including vaccinations and action plan for exacerbation. Will add Ohtuvayre   Assessment & Plan COPD GOLD III End stage COPD CONTINUE budesonide  (Pulmicort ) nebulizer once a day. REFILL CONTINUE Ipratropium-Albuterol  (Duonebs) q6h AS NEEDED for shortness of breath or wheezing. REFILL CONTINUE Spiriva  2.5 mcg TWO puffs ONCE a day. REFILL CONTINUE Symbicort  160-4.5 mcg  TWO puffs TWICE a day. REFILL  CONTINUE Proair  AS NEEDED for shortness of breath and wheezing Enroll Ohtuvayre  Declined pulmonary rehab. Previously completed in 2021. Encourage continuing aerobic exercises and upper body exercises COPD with acute exacerbation (HCC) Prednisone  taper ordered Chronic respiratory failure with hypoxia/ noct hypoxemia s significant ex desat SpO2 goal >88% Ambulatory O2 today showed desaturations ORDERED O2 for below: CONTINUE 3L continuous with activity and sleep Next recrtification due 12/2024 Goals of care, counseling/discussion Previously discussed goals of care including wish for CPR and ventilator. Please continue discussions at home.  Currently full code  Health Maintenance Immunization History  Administered Date(s)  Administered   DTaP 06/30/2011   Fluad Quad(high Dose 65+) 01/03/2021   INFLUENZA, HIGH DOSE SEASONAL PF 01/09/2016, 01/12/2018, 01/12/2018, 12/26/2019   Influenza Nasal 12/21/2018   Influenza Split 01/01/2015   Influenza-Unspecified 12/22/2012, 12/22/2013, 12/22/2017   PFIZER(Purple Top)SARS-COV-2 Vaccination 04/05/2019, 05/06/2019, 02/01/2020   Pneumococcal Conjugate-13 09/28/2013   Pneumococcal Polysaccharide-23 05/30/2010   CT Lung Screen - not qualified  No orders of the defined types were placed in this encounter.  Meds ordered this encounter  Medications   ipratropium-albuterol  (DUONEB) 0.5-2.5 (3) MG/3ML SOLN    Sig: Take 3 mLs by nebulization every 6 (six) hours as needed.    Dispense:  360 mL    Refill:  11    DX J44.9   budesonide  (PULMICORT ) 0.5 MG/2ML nebulizer solution    Sig: Take 2 mLs (0.5 mg total) by nebulization daily.    Dispense:  60 mL    Refill:  11   budesonide -formoterol  (SYMBICORT ) 160-4.5 MCG/ACT inhaler    Sig: Inhale 2 puffs into the lungs in the morning and at bedtime.    Dispense:  10.2 each    Refill:  11    Lot Number?:   [id number]    Expiration Date?:   09/21/2016    Manufacturer?:   [employer] [[id number]]    Quantity:   1   Tiotropium Bromide  (SPIRIVA  RESPIMAT) 2.5 MCG/ACT AERS    Sig: Inhale 2 puffs into the lungs daily.    Dispense:  4 g    Refill:  11    Lot Number?:   [id number]    Expiration Date?:   12/21/2020    Quantity:   4   albuterol  (VENTOLIN  HFA) 108 (90 Base) MCG/ACT inhaler    Sig: Inhale 1 puff into the lungs every 6 (six) hours as needed for wheezing or shortness of breath.    Dispense:  17 each    Refill:  5   predniSONE  (DELTASONE ) 10 MG tablet    Sig: Take 4 tablets (40 mg total) by mouth daily with breakfast for 3 days, THEN 3 tablets (30 mg total) daily with breakfast for 3 days, THEN 2 tablets (20 mg total) daily with breakfast for 3 days, THEN 1 tablet (10 mg total) daily with breakfast for 3 days.    Dispense:  30  tablet    Refill:  0   Return in about 3 months (around 04/06/2024).   I have spent a total time of  31-minutes on the day of the appointment including chart review, data review, collecting history, coordinating care and discussing medical diagnosis and plan with the patient/family. Past medical history, allergies, medications were reviewed. Pertinent imaging, labs and tests included in this note have been reviewed and interpreted independently by me.  Marcell Chavarin Slater Staff, MD [facility] 01/05/2024 2:39 PM

## 2024-01-05 NOTE — Patient Instructions (Addendum)
 End-stage COPD COPD exacerbation Prednisone  taper ordered CONTINUE budesonide  (Pulmicort ) nebulizer once a day. REFILL CONTINUE Ipratropium-Albuterol  (Duonebs) q6h AS NEEDED for shortness of breath or wheezing. REFILL CONTINUE Spiriva  2.5 mcg TWO puffs ONCE a day. REFILL CONTINUE Symbicort  160-4.5 mcg TWO puffs TWICE a day. REFILL  CONTINUE Proair  AS NEEDED for shortness of breath and wheezing Enroll Ohtuvayre 

## 2024-01-07 NOTE — Assessment & Plan Note (Addendum)
 SpO2 goal >88% Ambulatory O2 today showed desaturations ORDERED O2 for below: CONTINUE 3L continuous with activity and sleep Next recrtification due 12/2024

## 2024-01-12 DIAGNOSIS — D519 Vitamin B12 deficiency anemia, unspecified: Secondary | ICD-10-CM | POA: Diagnosis not present

## 2024-01-14 ENCOUNTER — Telehealth: Payer: Self-pay

## 2024-01-14 NOTE — Telephone Encounter (Signed)
 Received Ohtuvayre  new start paperwork. Completed form and faxed with clinicals and insurance card copy to [facility]   Phone#: [phone] Fax#: [phone]

## 2024-01-15 ENCOUNTER — Other Ambulatory Visit (HOSPITAL_BASED_OUTPATIENT_CLINIC_OR_DEPARTMENT_OTHER): Payer: Self-pay

## 2024-01-15 MED ORDER — IPRATROPIUM-ALBUTEROL 0.5-2.5 (3) MG/3ML IN SOLN: 3.0000 mL | Freq: Four times a day (QID) | RESPIRATORY_TRACT | 11 refills | Status: AC | PRN

## 2024-01-15 MED ORDER — BUDESONIDE 0.5 MG/2ML IN SUSP: 0.5000 mg | Freq: Every day | RESPIRATORY_TRACT | 11 refills | Status: AC

## 2024-01-15 NOTE — Telephone Encounter (Signed)
 Received fax from [employer] confirming receipt of enrollment form.  Patient ID: [id number]

## 2024-01-18 NOTE — Telephone Encounter (Signed)
 Received fax from [employer] with summary of benefits. Referral form for Ohtuvayre  received. Rx will be triaged to [facility].. Once benefits investigation completed, pharmacy will reach out the patient to schedule shipment. If medication is unaffordable, patient will need to express financial hardship to be referred back to [facility] for patient assistance program pre-screening.   Patient ID: [id number] Pharmacy phone: [phone] [facility] Phone#: [phone]

## 2024-01-19 NOTE — Telephone Encounter (Signed)
 Received fax from [facility] confirming receipt of rx.

## 2024-03-23 DIAGNOSIS — I361 Nonrheumatic tricuspid (valve) insufficiency: Secondary | ICD-10-CM | POA: Diagnosis not present

## 2024-03-23 DIAGNOSIS — I340 Nonrheumatic mitral (valve) insufficiency: Secondary | ICD-10-CM | POA: Diagnosis not present

## 2024-03-23 DIAGNOSIS — I360 Nonrheumatic tricuspid (valve) stenosis: Secondary | ICD-10-CM | POA: Diagnosis not present

## 2024-03-28 ENCOUNTER — Telehealth: Payer: Self-pay

## 2024-03-28 NOTE — Transitions of Care (Post Inpatient/ED Visit) (Signed)
 "  03/28/2024  Name: Mike Mclaughlin MRN: [id number] DOB: [birth year]  Today's TOC FU Call Status: Today's TOC FU Call Status:: Successful TOC FU Call Completed TOC FU Call Complete Date: 03/25/24  Patient's Name and Date of Birth confirmed. DOB, Name  Transition Care Management Follow-up Telephone Call Date of Discharge: 03/25/24 Discharge Facility: Other [facility]) Name of Other (Non[facility]) Discharge Facility: [facility] Type of Discharge: Inpatient Admission Primary Inpatient Discharge Diagnosis:: CAP, COPD exac How have you been since you were released from the hospital?: Better Any questions or concerns?: No  Items Reviewed: Did you receive and understand the discharge instructions provided?: Yes Medications obtained,verified, and reconciled?: Yes (Medications Reviewed) Any new allergies since your discharge?: No Dietary orders reviewed?: NA  Medications Reviewed Today: Medications Reviewed Today     Reviewed by [name] LABOR, RN (Registered Nurse) on 03/28/24 at 1354  Med List Status: <None>   Medication Order Taking? Sig Documenting Provider Last Dose Status Informant  albuterol  (VENTOLIN  HFA) 108 (90 Base) MCG/ACT inhaler [id number] Yes Inhale 1 puff into the lungs every 6 (six) hours as needed for wheezing or shortness of breath. Kassie Acquanetta Bradley, MD  Active   aspirin 81 MG tablet [id number] Yes Take 81 mg by mouth daily. [provider]  Active Self  atorvastatin  (LIPITOR) 10 MG tablet [id number] Yes Take 1 tablet (10 mg total) by mouth daily. Tobb, Kardie, DO  Active   benzonatate (TESSALON) 100 MG capsule [id number] Yes Take 100 mg by mouth 3 (three) times daily as needed for cough. [provider]  Active   budesonide  (PULMICORT ) 0.5 MG/2ML nebulizer solution [id number] Yes Take 2 mLs (0.5 mg total) by nebulization daily. Kassie Acquanetta Bradley, MD  Active   budesonide -formoterol  (SYMBICORT ) 160-4.5 MCG/ACT inhaler [id number] Yes Inhale 2 puffs  into the lungs in the morning and at bedtime. Kassie Acquanetta Bradley, MD  Active   cholecalciferol 25 MCG (1000 UT) tablet [id number] Yes Take 1,000 Units by mouth daily. [provider]  Active   Cyanocobalamin (B-12 COMPLIANCE INJECTION) 1000 MCG/ML KIT [id number] Yes Inject as directed. [provider]  Active   Dextromethorphan-guaiFENesin 10-100 MG/5ML liquid [id number] Yes Take 10 mLs by mouth every 6 (six) hours as needed (cough). [provider]  Active   Ensifentrine  3 MG/2.5ML SUSP [id number] Yes Inhale 3 mg into the lungs daily. [provider]  Active   ipratropium-albuterol  (DUONEB) 0.5-2.5 (3) MG/3ML SOLN [id number] Yes Take 3 mLs by nebulization every 6 (six) hours as needed. Kassie Acquanetta Bradley, MD  Active   levofloxacin (LEVAQUIN) 500 MG tablet [id number] Yes Take 500 mg by mouth daily. [provider]  Active   Multiple Vitamin (MULTIVITAMIN) tablet [id number]  Take 1 tablet by mouth daily.  Patient not taking: Reported on 03/28/2024   [provider]  Active   omeprazole (PRILOSEC) 20 MG capsule [id number] Yes Take 20 mg by mouth daily. [provider]  Active Self  OXYGEN  [id number] Yes Place 3 L into the nose daily. 2.5 lpm with sleep  Patient taking differently: Place 3 L into the nose daily. 3.5 liters continuous - can go up to 5L with ambulation with portable oxygen  concentrator   [provider]  Active Self  polyethylene glycol (MIRALAX / GLYCOLAX) 17 g packet [id number] Yes Take 17 g by mouth daily. [provider]  Active   predniSONE  (DELTASONE ) 10 MG tablet [id number] Yes Take 10 mg by mouth daily. [provider]  Active   Spacer/Aero-Holding [place] (  OPTICHAMBER DIAMOND -LG MASK) DEVI [id number]  Use with inhaler  Patient not taking: Reported on 03/28/2024   Kassie Acquanetta Bradley, MD  Active   Tamsulosin HCl (FLOMAX) 0.4 MG CAPS [id number] Yes Take 0.4 mg by mouth daily.  [provider]  Active  Self  Tiotropium Bromide  (SPIRIVA  RESPIMAT) 2.5 MCG/ACT AERS [id number] Yes Inhale 2 puffs into the lungs daily. Kassie Acquanetta Bradley, MD  Active             Home Care and Equipment/Supplies: Were Home Health Services Ordered?: Yes Name of Home Health Agency:: [facility] Has Agency set up a time to come to your home?: No (wife states they called and are going to call back today) Any new equipment or medical supplies ordered?: No  Functional Questionnaire: Do you need assistance with bathing/showering or dressing?: Yes (wife will watch over patient if he gets in shower) Do you need assistance with meal preparation?: Yes Do you need assistance with eating?: No Do you have difficulty maintaining continence: No Do you need assistance with getting out of bed/getting out of a chair/moving?: No Do you have difficulty managing or taking your medications?: Yes  Follow up appointments reviewed: PCP Follow-up appointment confirmed?: Yes Date of PCP follow-up appointment?: 03/29/24 Follow-up Provider: PCP, Lauraine Sic Specialist [facility] Follow-up appointment confirmed?: Yes Follow-Up Specialty Provider:: Pulmonary Dr Fleeta in [place] in a week and a half Do you need transportation to your follow-up appointment?: No Do you understand care options if your condition(s) worsen?: Yes-patient verbalized understanding  SDOH Interventions Today    Flowsheet Row Most Recent Value  SDOH Interventions   Food Insecurity Interventions Intervention Not Indicated  Housing Interventions Intervention Not Indicated  Transportation Interventions Intervention Not Indicated  Utilities Interventions Intervention Not Indicated    Goals Addressed             This Visit's Progress    VBCI Transitions of Care (TOC) Care Plan       Problems:  Recent Hospitalization for treatment of CAP with COPD exacerbation PCP notified of O2 sat and HR - wife reported O2 dropped to 74% over the weekend and  reported a HR today of 37 but states patient always runs 40-50 - also reported black stools  - PCP office notified - patient has appt tomorrow but will do as PCP recommends - TOC RN will do quick touch base 03/30/24  Unable to complete all assessment questions as wife had to end the call  Goal:  Over the next 30 days, the patient will not experience hospital readmission  Interventions:  Transitions of Care: Doctor Visits  - discussed the importance of doctor visits Contacted provider for patient needs Notified PCP as stated above  COPD Interventions: Advised patient to track and manage COPD triggers Assessed social determinant of health barriers Discussed the importance of adequate rest and management of fatigue with COPD Provided instruction about proper use of medications used for management of COPD including inhalers Use of home oxygen   Patient Self Care Activities:  Attend all scheduled provider appointments Call pharmacy for medication refills 3-7 days in advance of running out of medications Call provider office for new concerns or questions  Notify RN Care Manager of [facility] call rescheduling needs Participate in Transition of Care Program/Attend [facility] scheduled calls Take medications as prescribed   identify and remove indoor air pollutants limit outdoor activity during cold weather keep follow-up appointments: PCP 03/29/24 and pulmonary week of 04/04/24  Plan:  Telephone follow up appointment  with care management team member scheduled for:  04/05/24 and quick touch base 03/30/24 The patient has been provided with contact information for the care management team and has been advised to call with any health related questions or concerns.          Shona Prow RN, CCM [place]  VBCI-Population Health RN Care Manager [phone]  "

## 2024-03-30 ENCOUNTER — Telehealth: Payer: Self-pay

## 2024-03-30 NOTE — Progress Notes (Signed)
 " [facility] at [facility] [address] [place],  [place]  [address] [phone]  Clinic Day:  04/02/2023  Referring physician: Benjamine Lauraine DASEN, NP   HISTORY OF PRESENT ILLNESS:  The patient is a 89 y.o. male with iron deficiency anemia.  He comes in today to reassess his iron and hemoglobin levels after receiving IV iron in August 2025.   Of note, the patient was recently discharged from the hospital for pneumonia.  He also states that over the past week he has had black stools, which are new.  He takes a baby aspirin daily, which he has been doing for years.  He denies being on any other type of blood thinning agent.  He also denies taking oral iron or Pepto-Bismol.  PHYSICAL EXAM:  Blood pressure (!) 131/49, pulse (!) 58, temperature 98.3 F (36.8 C), temperature source Oral, resp. rate 18, height 5' 10 (1.778 m), weight 182 lb 8 oz (82.8 kg), SpO2 97%. Wt Readings from Last 3 Encounters:  03/31/24 182 lb 8 oz (82.8 kg)  01/05/24 181 lb (82.1 kg)  09/28/23 186 lb 11.2 oz (84.7 kg)   Body mass index is 26.19 kg/m. Performance status (ECOG): 2 - Symptomatic, <50% confined to bed Physical Exam Constitutional:      Appearance: Normal appearance. He is not ill-appearing.  HENT:     Mouth/Throat:     Mouth: Mucous membranes are moist.     Pharynx: Oropharynx is clear. No oropharyngeal exudate or posterior oropharyngeal erythema.  Cardiovascular:     Rate and Rhythm: Normal rate and regular rhythm.     Heart sounds: No murmur heard.    No friction rub. No gallop.  Pulmonary:     Effort: Pulmonary effort is normal. No respiratory distress.     Breath sounds: Normal breath sounds. No wheezing, rhonchi or rales.  Abdominal:     General: Bowel sounds are normal. There is no distension.     Palpations: Abdomen is soft. There is no mass.     Tenderness: There is no abdominal tenderness.  Musculoskeletal:        General: No swelling.     Right lower leg: No edema.      Left lower leg: No edema.  Lymphadenopathy:     Cervical: No cervical adenopathy.     Upper Body:     Right upper body: No supraclavicular or axillary adenopathy.     Left upper body: No supraclavicular or axillary adenopathy.     Lower Body: No right inguinal adenopathy. No left inguinal adenopathy.  Skin:    General: Skin is warm.     Coloration: Skin is not jaundiced.     Findings: No lesion or rash.  Neurological:     General: No focal deficit present.     Mental Status: He is alert and oriented to person, place, and time. Mental status is at baseline.  Psychiatric:        Mood and Affect: Mood normal.        Behavior: Behavior normal.        Thought Content: Thought content normal.    LABS:      Latest Ref Rng & Units 03/31/2024    1:13 PM 12/29/2023    1:55 PM 09/28/2023    1:20 PM  CBC  WBC 4.0 - 10.5 K/uL 15.7  6.2  5.3   Hemoglobin 13.0 - 17.0 g/dL 9.6  88.5  88.2   Hematocrit 39.0 - 52.0 %  30.3  36.7  38.0   Platelets 150 - 400 K/uL 302  158  172       Latest Ref Rng & Units 03/31/2024    1:13 PM 09/28/2023    1:20 PM 05/29/2023    1:26 PM  CMP  Glucose 70 - 99 mg/dL 98  898  897   BUN 8 - 23 mg/dL 21  27  29    Creatinine 0.61 - 1.24 mg/dL 8.78  8.34  8.57   Sodium 135 - 145 mmol/L 139  140  140   Potassium 3.5 - 5.1 mmol/L 4.7  5.0  4.5   Chloride 98 - 111 mmol/L 103  103  104   CO2 22 - 32 mmol/L 26  26  27    Calcium  8.9 - 10.3 mg/dL 9.3  9.6  9.5   Total Protein 6.5 - 8.1 g/dL 5.6  6.4  6.5   Total Bilirubin 0.0 - 1.2 mg/dL 0.3  0.4  0.3   Alkaline Phos 38 - 126 U/L 66  104  102   AST 15 - 41 U/L 24  16  19    ALT 0 - 44 U/L 23  9  8      Latest Reference Range & Units 03/31/24 13:12  Iron 45 - 182 ug/dL 39 (L)  UIBC ug/dL 787  TIBC 749 - 549 ug/dL 748  Saturation Ratios 17.9 - 39.5 % 15 (L)  Ferritin 24 - 336 ng/mL 329  (L): Data is abnormally low  ASSESSMENT & PLAN:  An 89 y.o. male with iron deficiency anemia.  When evaluating his labs today, some  of his iron parameters are trending lower.  His ferritin level is normal, but this may be due to him recently having pneumonia.  Based upon the lower trend of his iron parameters and his recent black stools, I do believe it is worth him receiving IV iron over these next few weeks to replenish his iron stores.  I also believe it will be important for him to see his gastroenterologist to undergo an upper endoscopy to better determine the origin of his dark black stools.  Otherwise, I will see this patient back in 3 months to reassess his iron and hemoglobin levels to see how well he responded to his upcoming IV iron.   The patient understands all the plans discussed today and is in agreement with them.  Konya Fauble DELENA Kerns, MD       "

## 2024-03-30 NOTE — Transitions of Care (Post Inpatient/ED Visit) (Signed)
" °  Transition of Care Follow up and [facility] program closure  Visit Note  03/30/2024  Name: Mike Mclaughlin MRN: [id number]          DOB: 10-Feb-1935  Situation: Patient enrolled in [facility] 30-day program. Visit completed with wife by telephone.   Background: Discharge with diagnosis of CAP with COPD exacerbation  Initial Transition Care Management Follow-up Telephone Call Discharge Date and Diagnosis: 03/25/24, CAP, COPD exac   Past Medical History:  Diagnosis Date   Arthritis    Cardiac dysrhythmia, unspecified    Chronic pain of right knee 09/04/2017   Chronic respiratory failure with hypoxia 11/14/2015          COPD GOLD III 11/29/2013       DOE (dyspnea on exertion) 10/06/2018   Dyslipidemia    Esophageal reflux    H/O sinus bradycardia    Hypertension    Insomnia, unspecified    Malaise and fatigue 10/06/2018   Medication management 10/13/2019   Mixed hyperlipidemia    Physical deconditioning 07/12/2019   Rheumatism    Sinus bradycardia 09/04/2017   Skin cancer, basal cell    Solitary pulmonary nodule 11/29/2013   L lingula 5mm 10/2013.  Rescan 10/2014>>>no change in nodule,  Final CT 10/19/15 jennye) > no change so no dedicated f/u rec    Status post right hip replacement 11/18/2017    Assessment: Wife declined to continue with [facility] program - unable to complete assessments, med review. Requested TOC program closure  Recommendation:   Patient has home health and will continue following with providers  Follow Up Plan:   Closing From:  Transitions of Care Program  Shona Prow RN, CCM [facility]  VBCI-Population Health RN Care Manager [phone]     "

## 2024-03-31 ENCOUNTER — Telehealth: Payer: Self-pay | Admitting: Oncology

## 2024-03-31 ENCOUNTER — Inpatient Hospital Stay: Attending: Oncology | Admitting: Oncology

## 2024-03-31 ENCOUNTER — Other Ambulatory Visit: Payer: Self-pay | Admitting: Oncology

## 2024-03-31 ENCOUNTER — Inpatient Hospital Stay

## 2024-03-31 VITALS — BP 131/49 | HR 58 | Temp 98.3°F | Resp 18 | Ht 70.0 in | Wt 182.5 lb

## 2024-03-31 DIAGNOSIS — K921 Melena: Secondary | ICD-10-CM | POA: Insufficient documentation

## 2024-03-31 DIAGNOSIS — Z79899 Other long term (current) drug therapy: Secondary | ICD-10-CM | POA: Insufficient documentation

## 2024-03-31 DIAGNOSIS — Z8701 Personal history of pneumonia (recurrent): Secondary | ICD-10-CM | POA: Insufficient documentation

## 2024-03-31 DIAGNOSIS — D509 Iron deficiency anemia, unspecified: Secondary | ICD-10-CM | POA: Insufficient documentation

## 2024-03-31 DIAGNOSIS — D500 Iron deficiency anemia secondary to blood loss (chronic): Secondary | ICD-10-CM

## 2024-03-31 LAB — CBC WITH DIFFERENTIAL (CANCER CENTER ONLY)
Abs Immature Granulocytes: 0.87 K/uL — ABNORMAL HIGH (ref 0.00–0.07)
Basophils Absolute: 0 K/uL (ref 0.0–0.1)
Basophils Relative: 0 %
Eosinophils Absolute: 0.1 K/uL (ref 0.0–0.5)
Eosinophils Relative: 0 %
HCT: 30.3 % — ABNORMAL LOW (ref 39.0–52.0)
Hemoglobin: 9.6 g/dL — ABNORMAL LOW (ref 13.0–17.0)
Immature Granulocytes: 6 %
Lymphocytes Relative: 4 %
Lymphs Abs: 0.7 K/uL (ref 0.7–4.0)
MCH: 30.5 pg (ref 26.0–34.0)
MCHC: 31.7 g/dL (ref 30.0–36.0)
MCV: 96.2 fL (ref 80.0–100.0)
Monocytes Absolute: 0.6 K/uL (ref 0.1–1.0)
Monocytes Relative: 4 %
Neutro Abs: 13.4 K/uL — ABNORMAL HIGH (ref 1.7–7.7)
Neutrophils Relative %: 86 %
Platelet Count: 302 K/uL (ref 150–400)
RBC: 3.15 MIL/uL — ABNORMAL LOW (ref 4.22–5.81)
RDW: 12.8 % (ref 11.5–15.5)
Smear Review: NORMAL
WBC Count: 15.7 K/uL — ABNORMAL HIGH (ref 4.0–10.5)
nRBC: 0 % (ref 0.0–0.2)

## 2024-03-31 LAB — IRON AND TIBC
Iron: 39 ug/dL — ABNORMAL LOW (ref 45–182)
Saturation Ratios: 15 % — ABNORMAL LOW (ref 17.9–39.5)
TIBC: 251 ug/dL (ref 250–450)
UIBC: 212 ug/dL

## 2024-03-31 LAB — CMP (CANCER CENTER ONLY)
ALT: 23 U/L (ref 0–44)
AST: 24 U/L (ref 15–41)
Albumin: 3.3 g/dL — ABNORMAL LOW (ref 3.5–5.0)
Alkaline Phosphatase: 66 U/L (ref 38–126)
Anion gap: 10 (ref 5–15)
BUN: 21 mg/dL (ref 8–23)
CO2: 26 mmol/L (ref 22–32)
Calcium: 9.3 mg/dL (ref 8.9–10.3)
Chloride: 103 mmol/L (ref 98–111)
Creatinine: 1.21 mg/dL (ref 0.61–1.24)
GFR, Estimated: 58 mL/min — ABNORMAL LOW
Glucose, Bld: 98 mg/dL (ref 70–99)
Potassium: 4.7 mmol/L (ref 3.5–5.1)
Sodium: 139 mmol/L (ref 135–145)
Total Bilirubin: 0.3 mg/dL (ref 0.0–1.2)
Total Protein: 5.6 g/dL — ABNORMAL LOW (ref 6.5–8.1)

## 2024-03-31 LAB — FERRITIN: Ferritin: 329 ng/mL (ref 24–336)

## 2024-03-31 NOTE — Telephone Encounter (Signed)
 Patient has been scheduled for follow-up visit per 03/31/2024 LOS.  Pt given an appt calendar with date and time.

## 2024-04-01 ENCOUNTER — Telehealth: Payer: Self-pay

## 2024-04-01 ENCOUNTER — Other Ambulatory Visit: Payer: Self-pay

## 2024-04-01 DIAGNOSIS — D500 Iron deficiency anemia secondary to blood loss (chronic): Secondary | ICD-10-CM

## 2024-04-01 NOTE — Telephone Encounter (Signed)
 Per Dr. Ezzard:  Inconclusive need to have soluble transferrin level checked.  Patient is scheduled for 3:00pm on Monday 04/04/2024.

## 2024-04-04 ENCOUNTER — Inpatient Hospital Stay

## 2024-04-04 ENCOUNTER — Other Ambulatory Visit: Payer: Self-pay

## 2024-04-04 DIAGNOSIS — D509 Iron deficiency anemia, unspecified: Secondary | ICD-10-CM | POA: Diagnosis not present

## 2024-04-04 DIAGNOSIS — R5381 Other malaise: Secondary | ICD-10-CM

## 2024-04-04 DIAGNOSIS — D500 Iron deficiency anemia secondary to blood loss (chronic): Secondary | ICD-10-CM

## 2024-04-05 ENCOUNTER — Telehealth: Payer: Self-pay

## 2024-04-05 LAB — SOLUBLE TRANSFERRIN RECEPTOR: Transferrin Receptor: 11.3 nmol/L — ABNORMAL LOW (ref 12.2–27.3)

## 2024-04-05 NOTE — Telephone Encounter (Signed)
 Patients wife called for lab results. Spoke with Dr Ezzard patient does not need IV iron . Will need to start iron by mouth ferrous sulfate 325mg  po twice daily. Wife voiced understanding.

## 2024-04-10 ENCOUNTER — Other Ambulatory Visit: Payer: Self-pay | Admitting: Cardiology

## 2024-04-14 ENCOUNTER — Ambulatory Visit (HOSPITAL_BASED_OUTPATIENT_CLINIC_OR_DEPARTMENT_OTHER): Admitting: Pulmonary Disease

## 2024-04-14 ENCOUNTER — Encounter (HOSPITAL_BASED_OUTPATIENT_CLINIC_OR_DEPARTMENT_OTHER): Payer: Self-pay | Admitting: Pulmonary Disease

## 2024-04-14 VITALS — BP 134/55 | HR 55 | Ht 70.0 in | Wt 183.1 lb

## 2024-04-14 DIAGNOSIS — Z87891 Personal history of nicotine dependence: Secondary | ICD-10-CM

## 2024-04-14 DIAGNOSIS — J449 Chronic obstructive pulmonary disease, unspecified: Secondary | ICD-10-CM | POA: Diagnosis not present

## 2024-04-14 DIAGNOSIS — Z7189 Other specified counseling: Secondary | ICD-10-CM

## 2024-04-14 DIAGNOSIS — J9611 Chronic respiratory failure with hypoxia: Secondary | ICD-10-CM | POA: Diagnosis not present

## 2024-04-14 NOTE — Assessment & Plan Note (Addendum)
 End stage COPD CONTINUE budesonide  (Pulmicort ) nebulizer once a day.  CONTINUE Ipratropium-Albuterol  (Duonebs) q6h AS NEEDED for shortness of breath or wheezing. REFILL CONTINUE Spiriva  2.5 mcg TWO puffs ONCE a day.  CONTINUE Symbicort  160-4.5 mcg TWO puffs TWICE a day.   CONTINUE Proair  AS NEEDED for shortness of breath and wheezing INCREASE Ohtuvayre  to twice a day Previously completed in 2021 in pulmonary rehab. Consider re-enrollment in the summer. CONTINUE physical therapy. Encourage continuing aerobic exercises and upper body exercises

## 2024-04-14 NOTE — Progress Notes (Signed)
 "    Subjective:   PATIENT ID: Mike Mclaughlin GENDER: male DOB: Nov 14, 1934, MRN: [id number]  Chief Complaint  Patient presents with   COPD    Reason for Visit: Follow-up COPD     Mike Mclaughlin is a 89 y.o. male with chronic hypoxemic respiratory failure secondary to COPD, bradycardia, probable pulmonary hypertension and chronic diastolic heart failure who presents for follow-up.   Synopsis: 2021 - Established care with [facility]. Started on Spiriva  in January. Compliant on both Symbicort  and Spiriva . On home oxygen  nightly. Completed Pulmonary Rehab in [place] 2022 - Continued maintenance rehab program. Started requiring O2 with activity. Had COVID in July 2023 - Outpatient COPD exacerbation in Oct. SOB with exertion. On Symbicort  and Spiriva  and Duonebs TID. Compliant with oxygen  with exertion 2024 -Compliant on Symbicort , Spiriva , oxygen , scheduled duonebs. Pulmicort  added to regimen. No exacerbations this year. Iron infusions helped with energy this year 2025 - Hospitalized for exacerbation in June. On Spiriva , Symbicort , budesonide  nebs and oxygen . Approved for Ohtuvayre  in October  Social History: 1/2ppd x 20 years. Quit 2013.   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     04/14/2024 Discussed the use of AI scribe software for clinical note transcription with the patient, who gave verbal consent to proceed.  History of Present Illness Mike Mclaughlin is an 89 year old male with COPD who presents for follow-up after hospitalization for pneumonia.  He was hospitalized at [facility] from December 30th to January 2nd for pneumonia and was treated with antibiotics.  He is currently undergoing physical therapy at home twice a week, which he finds beneficial. A nurse visits him regularly for check-ups. His medication regimen includes Spiriva , Symbicort , Albuterol , a steroid nebulizer (budesonide ), and a new medication,  possibly 'Ardorag' or 'Utuve', taken once daily. He experiences depression after starting this new medication.  He experiences nighttime depression and sadness, particularly when undressing. The recent passing of two friends has been emotionally challenging. He also reports viral infections after a hospital visit, causing blisters around his nose and mouth.  He is on continuous oxygen  therapy, using 3.5 liters at home and 5L pulsed when walking, maintaining oxygen  saturation above 90%, typically around 97-98%. He has received COVID, flu, and pneumonia vaccinations and takes 325 mg of iron twice daily. He reports a dry mouth upon waking, possibly due to mouth breathing at night, and occasionally experiences nightmares and restlessness during sleep.  He is able to walk through [place] and attend church, though he experiences shortness of breath when walking uphill. His oxygen  levels are checked by his therapist after walking, showing levels of 97-98%.  He has discussed his end-of-life preferences, expressing a desire not to be on a ventilator or in a long-term care facility if incapacitated.     Past Medical History:  Diagnosis Date   Arthritis    Cardiac dysrhythmia, unspecified    Chronic pain of right knee 09/04/2017   Chronic respiratory failure with hypoxia 11/14/2015          COPD GOLD III 11/29/2013       DOE (dyspnea on exertion) 10/06/2018   Dyslipidemia    Esophageal reflux    H/O sinus bradycardia    Hypertension    Insomnia, unspecified    Malaise and fatigue 10/06/2018   Medication management 10/13/2019   Mixed hyperlipidemia    Physical deconditioning 07/12/2019   Rheumatism    Sinus bradycardia 09/04/2017   Skin cancer, basal cell    Solitary pulmonary nodule 11/29/2013  L lingula 5mm 10/2013.  Rescan 10/2014>>>no change in nodule,  Final CT 10/19/15 jennye) > no change so no dedicated f/u rec    Status post right hip replacement 11/18/2017     Family History  Problem  Relation Age of Onset   Diverticulitis Mother    Rheumatologic disease Mother    Other Father        brain tumor   Stroke Father    Heart disease Sister    Cancer Sister    Heart disease Brother      Social History   Occupational History   Occupation: RETIRED ACCOUNTANT FOR [employer]  Tobacco Use   Smoking status: Former    Current packs/day: 0.00    Average packs/day: 0.5 packs/day for 15.0 years (7.5 ttl pk-yrs)    Types: Cigarettes    Start date: 08/30/1998    Quit date: 08/29/2013    Years since quitting: 10.6   Smokeless tobacco: Never  Vaping Use   Vaping status: Never Used  Substance and Sexual Activity   Alcohol use: No   Drug use: No   Sexual activity: Yes    Allergies[1]   Outpatient Medications Prior to Visit  Medication Sig Dispense Refill   albuterol  (VENTOLIN  HFA) 108 (90 Base) MCG/ACT inhaler Inhale 1 puff into the lungs every 6 (six) hours as needed for wheezing or shortness of breath. 17 each 5   aspirin 81 MG tablet Take 81 mg by mouth daily.     atorvastatin  (LIPITOR) 10 MG tablet TAKE 1 TABLET BY MOUTH EVERY DAY 90 tablet 0   benzonatate (TESSALON) 100 MG capsule Take 100 mg by mouth 3 (three) times daily as needed for cough.     budesonide  (PULMICORT ) 0.5 MG/2ML nebulizer solution Take 2 mLs (0.5 mg total) by nebulization daily. 60 mL 11   budesonide -formoterol  (SYMBICORT ) 160-4.5 MCG/ACT inhaler Inhale 2 puffs into the lungs in the morning and at bedtime. 10.2 each 11   Dextromethorphan-guaiFENesin 10-100 MG/5ML liquid Take 10 mLs by mouth every 6 (six) hours as needed (cough).     Ensifentrine  3 MG/2.5ML SUSP Inhale 3 mg into the lungs daily.     ferrous sulfate 325 (65 FE) MG tablet Take 325 mg by mouth 2 (two) times daily with a meal.     ipratropium-albuterol  (DUONEB) 0.5-2.5 (3) MG/3ML SOLN Take 3 mLs by nebulization every 6 (six) hours as needed. 360 mL 11   levofloxacin (LEVAQUIN) 500 MG tablet Take 500 mg by mouth daily.     omeprazole (PRILOSEC)  20 MG capsule Take 20 mg by mouth daily.     OXYGEN  Place 3 L into the nose daily. 2.5 lpm with sleep (Patient taking differently: Place 3 L into the nose daily. 3.5 liters continuous - can go up to 5L with ambulation with portable oxygen  concentrator)     predniSONE  (DELTASONE ) 10 MG tablet Take 10 mg by mouth daily.     Tamsulosin HCl (FLOMAX) 0.4 MG CAPS Take 0.4 mg by mouth daily.      Tiotropium Bromide  (SPIRIVA  RESPIMAT) 2.5 MCG/ACT AERS Inhale 2 puffs into the lungs daily. 4 g 11   No facility-administered medications prior to visit.    ROS   Objective:   Vitals:   04/14/24 1306  BP: (!) 134/55  Pulse: (!) 55  SpO2: 95%  Weight: 183 lb 1.6 oz (83.1 kg)  Height: 5' 10 (1.778 m)   SpO2: 95 % (4 L POC)  Physical Exam GENERAL: Elderly appearing, no  acute distress HEAD EARS NOSE THROAT: Normocephalic, atraumatic EYES: Extraocular movements intact, no scleral icterus RESPIRATORY: Clear to auscultation bilaterally, no crackles, wheezing or rales, airways clear, no infection CARDIOVASCULAR: Regular rate and rhythm, no murmurs, rubs, or gallops, no jugular venous distention EXTREMITIES: No edema, no tenderness NEUROLOGICAL: Alert and oriented x4, cranial nerves II-XII grossly intact PSYCHIATRIC: Normal mood, normal affect   Data Reviewed:   Imaging: CT Chest 10/23/15 (report only) - Centrilobular emphysema. 5mm lingular nodule improved compared to prior imaging CT Chest 08/01/19 - Diffuse moderate centrilobular emphysema. Left lingular atelectasis   PFT: 11/14/15 FVC 2.0 (48%) FEV1 1.0 (33%) Ratio 49  Interpretation: Very severe obstructive defect present   01/17/21 FVC 2.71 (68%) FEV1 1.34 (48%) Ratio 48  TLC 103% DLCO 47% Interpretation: Severe obstructive defect with air trapping and moderately reduced DLCO consistent with emphysema. Significant bronchodilator response in FEV1 and FVC   04/08/21 FVC 2.65 (68%) FEV1 1.26 (46%) Ratio 46  TLC 107% RV 175% DLCO  42% Interpretation: Severe obstructive defect with air trapping and moderately reduced DLCO consistent with emphysema. Significant bronchodilator response with FEV1  Labs: CBC    Component Value Date/Time   WBC 15.7 (H) 03/31/2024 1313   WBC 5.6 04/13/2019 1502   RBC 3.15 (L) 03/31/2024 1313   HGB 9.6 (L) 03/31/2024 1313   HCT 30.3 (L) 03/31/2024 1313   PLT 302 03/31/2024 1313   MCV 96.2 03/31/2024 1313   MCH 30.5 03/31/2024 1313   MCHC 31.7 03/31/2024 1313   RDW 12.8 03/31/2024 1313   LYMPHSABS 0.7 03/31/2024 1313   MONOABS 0.6 03/31/2024 1313   EOSABS 0.1 03/31/2024 1313   BASOSABS 0.0 03/31/2024 1313   Absolute eos  04/13/19 - 200  03/31/24 - 100    Assessment & Plan:   Discussion: 89 year old male with chronic hypoxemic respiratory failure secondary to COPD, probable PH (WHO III) and chronic diastolic heart failure who presents for follow-up. Stable dyspnea. Two hospitalizations in 2025 in June and December for COPD.  End stage COPD on triple therapy and nebulized budesonide , Ohtuvayre . Discussed clinical course and management of COPD including bronchodilator regimen, preventive care including vaccinations (up-to-date) and action plan for exacerbation. Discussed GOC extensively with wife present. Addressed questions and concerns.   Assessment & Plan COPD GOLD III End stage COPD CONTINUE budesonide  (Pulmicort ) nebulizer once a day.  CONTINUE Ipratropium-Albuterol  (Duonebs) q6h AS NEEDED for shortness of breath or wheezing. REFILL CONTINUE Spiriva  2.5 mcg TWO puffs ONCE a day.  CONTINUE Symbicort  160-4.5 mcg TWO puffs TWICE a day.   CONTINUE Proair  AS NEEDED for shortness of breath and wheezing INCREASE Ohtuvayre  to twice a day Previously completed in 2021 in pulmonary rehab. Consider re-enrollment in the summer. CONTINUE physical therapy. Encourage continuing aerobic exercises and upper body exercises Chronic respiratory failure with hypoxia/ noct hypoxemia s significant  ex desat SpO2 goal >88% CONTINUE 3 L continuous with activity and sleep Continue 5L pulsed with activity Next recrtification due 12/2024 Goals of care, counseling/discussion Discussed goals of care including wish for CPR and ventilator Please continue discussions at home Currently full code  Health Maintenance Immunization History  Administered Date(s) Administered   DTaP 06/30/2011   Fluad Quad(high Dose 65+) 01/03/2021   INFLUENZA, HIGH DOSE SEASONAL PF 01/09/2016, 01/12/2018, 01/12/2018, 12/26/2019   Influenza Nasal 12/21/2018   Influenza Split 01/01/2015   Influenza-Unspecified 12/22/2012, 12/22/2013, 12/22/2017   PFIZER(Purple Top)SARS-COV-2 Vaccination 04/05/2019, 05/06/2019, 02/01/2020   Pneumococcal Conjugate-13 09/28/2013   Pneumococcal Polysaccharide-23 05/30/2010  CT Lung Screen - not qualified  No orders of the defined types were placed in this encounter. No orders of the defined types were placed in this encounter.   Return in about 4 months (around 08/12/2024).  I have spent a total time of 45-minutes on the day of the appointment reviewing prior documentation, coordinating care and discussing medical diagnosis and plan with the patient/family. Imaging, labs and tests included in this note have been reviewed and interpreted independently by me. This note is generated using [employer] programming. Patient/family has given consent.  Starlett Pehrson Slater Staff, MD [facility] 04/14/2024 5:37 PM        [1]  Allergies Allergen Reactions   Atorvastatin  Calcium  [Atorvastatin ]     Unaware    Chantix [Varenicline]     Unaware    Other Rash   "

## 2024-04-14 NOTE — Assessment & Plan Note (Addendum)
 SpO2 goal >88% CONTINUE 3 L continuous with activity and sleep Continue 5L pulsed with activity Next recrtification due 12/2024

## 2024-04-14 NOTE — Patient Instructions (Addendum)
 COPD GOLD III CONTINUE budesonide  (Pulmicort ) nebulizer once a day.  CONTINUE Ipratropium-Albuterol  (Duonebs) q6h AS NEEDED for shortness of breath or wheezing. REFILL CONTINUE Spiriva  2.5 mcg TWO puffs ONCE a day.  CONTINUE Symbicort  160-4.5 mcg TWO puffs TWICE a day.   CONTINUE Proair  AS NEEDED for shortness of breath and wheezing INCREASE Ohtuvayre  to twice a day Previously completed in 2021 in pulmonary rehab. Consider re-enrollment in the summer. CONTINUE physical therapy. Encourage continuing aerobic exercises and upper body exercises  Chronic respiratory failure with hypoxia/ noct hypoxemia s significant ex desat SpO2 goal >88% CONTINUE 3 L continuous with activity and sleep Continue 5L pulsed with activity Next recrtification due 12/2024  Goals of care, counseling/discussion Discussed goals of care including wish for CPR and ventilator Please continue discussions at home Currently full code

## 2024-04-14 NOTE — Assessment & Plan Note (Addendum)
 Discussed goals of care including wish for CPR and ventilator Please continue discussions at home Currently full code

## 2024-05-24 ENCOUNTER — Ambulatory Visit: Admitting: Cardiology

## 2024-06-30 ENCOUNTER — Inpatient Hospital Stay

## 2024-06-30 ENCOUNTER — Inpatient Hospital Stay: Admitting: Oncology

## 2024-08-16 ENCOUNTER — Ambulatory Visit (HOSPITAL_BASED_OUTPATIENT_CLINIC_OR_DEPARTMENT_OTHER): Admitting: Pulmonary Disease
